# Patient Record
Sex: Male | Born: 1935 | Race: White | Hispanic: No | Marital: Married | Smoking: Never smoker
Health system: Southern US, Community
[De-identification: ages and names within clinical notes are randomized; demographics above are authoritative.]

## PROBLEM LIST (undated history)

## (undated) DIAGNOSIS — N200 Calculus of kidney: Secondary | ICD-10-CM

## (undated) DIAGNOSIS — J069 Acute upper respiratory infection, unspecified: Secondary | ICD-10-CM

## (undated) DIAGNOSIS — I493 Ventricular premature depolarization: Secondary | ICD-10-CM

## (undated) DIAGNOSIS — K219 Gastro-esophageal reflux disease without esophagitis: Secondary | ICD-10-CM

## (undated) DIAGNOSIS — I499 Cardiac arrhythmia, unspecified: Secondary | ICD-10-CM

## (undated) DIAGNOSIS — C61 Malignant neoplasm of prostate: Secondary | ICD-10-CM

## (undated) DIAGNOSIS — M1990 Unspecified osteoarthritis, unspecified site: Secondary | ICD-10-CM

## (undated) HISTORY — PX: APPENDECTOMY: SHX54

## (undated) HISTORY — PX: CHOLECYSTECTOMY: SHX55

## (undated) HISTORY — PX: BACK SURGERY: SHX140

## (undated) HISTORY — PX: SHOULDER ARTHROSCOPY: SHX128

## (undated) HISTORY — PX: KNEE ARTHROSCOPY: SUR90

## (undated) HISTORY — PX: EYE SURGERY: SHX253

## (undated) HISTORY — PX: CERVICAL SPINE SURGERY: SHX589

## (undated) HISTORY — PX: WRIST SURGERY: SHX841

## (undated) HISTORY — PX: PROSTATE BIOPSY: SHX241

## (undated) HISTORY — PX: TOTAL HIP REVISION: SHX763

## (undated) HISTORY — PX: CARDIAC CATHETERIZATION: SHX172

## (undated) HISTORY — PX: TOTAL HIP ARTHROPLASTY: SHX124

---

## 1998-06-15 ENCOUNTER — Ambulatory Visit (HOSPITAL_COMMUNITY): Admission: RE | Admit: 1998-06-15 | Discharge: 1998-06-15 | Payer: Self-pay | Admitting: Gastroenterology

## 1999-08-09 ENCOUNTER — Encounter: Payer: Self-pay | Admitting: Orthopaedic Surgery

## 1999-08-09 ENCOUNTER — Ambulatory Visit (HOSPITAL_COMMUNITY): Admission: RE | Admit: 1999-08-09 | Discharge: 1999-08-09 | Payer: Self-pay | Admitting: Orthopaedic Surgery

## 1999-10-31 ENCOUNTER — Encounter: Payer: Self-pay | Admitting: Orthopaedic Surgery

## 1999-11-03 ENCOUNTER — Inpatient Hospital Stay (HOSPITAL_COMMUNITY): Admission: RE | Admit: 1999-11-03 | Discharge: 1999-11-09 | Payer: Self-pay | Admitting: Orthopaedic Surgery

## 1999-11-03 ENCOUNTER — Encounter: Payer: Self-pay | Admitting: Orthopaedic Surgery

## 1999-11-04 ENCOUNTER — Encounter: Payer: Self-pay | Admitting: Orthopaedic Surgery

## 1999-11-07 ENCOUNTER — Encounter: Payer: Self-pay | Admitting: Orthopaedic Surgery

## 1999-11-09 ENCOUNTER — Encounter: Payer: Self-pay | Admitting: Orthopaedic Surgery

## 2001-08-14 ENCOUNTER — Encounter: Payer: Self-pay | Admitting: Orthopaedic Surgery

## 2001-08-14 ENCOUNTER — Encounter: Admission: RE | Admit: 2001-08-14 | Discharge: 2001-08-14 | Payer: Self-pay | Admitting: Orthopaedic Surgery

## 2001-08-22 ENCOUNTER — Ambulatory Visit (HOSPITAL_COMMUNITY): Admission: RE | Admit: 2001-08-22 | Discharge: 2001-08-22 | Payer: Self-pay | Admitting: Gastroenterology

## 2001-08-22 ENCOUNTER — Encounter (INDEPENDENT_AMBULATORY_CARE_PROVIDER_SITE_OTHER): Payer: Self-pay | Admitting: *Deleted

## 2001-10-01 ENCOUNTER — Encounter: Payer: Self-pay | Admitting: Family Medicine

## 2001-10-01 ENCOUNTER — Encounter: Admission: RE | Admit: 2001-10-01 | Discharge: 2001-10-01 | Payer: Self-pay | Admitting: Family Medicine

## 2003-07-26 ENCOUNTER — Encounter: Admission: RE | Admit: 2003-07-26 | Discharge: 2003-07-26 | Payer: Self-pay | Admitting: Orthopaedic Surgery

## 2003-08-20 ENCOUNTER — Ambulatory Visit (HOSPITAL_BASED_OUTPATIENT_CLINIC_OR_DEPARTMENT_OTHER): Admission: RE | Admit: 2003-08-20 | Discharge: 2003-08-20 | Payer: Self-pay | Admitting: Orthopaedic Surgery

## 2003-08-20 ENCOUNTER — Ambulatory Visit (HOSPITAL_COMMUNITY): Admission: RE | Admit: 2003-08-20 | Discharge: 2003-08-20 | Payer: Self-pay | Admitting: Orthopaedic Surgery

## 2004-11-01 ENCOUNTER — Ambulatory Visit (HOSPITAL_COMMUNITY): Admission: RE | Admit: 2004-11-01 | Discharge: 2004-11-01 | Payer: Self-pay | Admitting: Gastroenterology

## 2005-09-22 ENCOUNTER — Inpatient Hospital Stay (HOSPITAL_COMMUNITY): Admission: AD | Admit: 2005-09-22 | Discharge: 2005-09-27 | Payer: Self-pay | Admitting: Cardiology

## 2005-09-22 ENCOUNTER — Encounter: Admission: RE | Admit: 2005-09-22 | Discharge: 2005-09-22 | Payer: Self-pay | Admitting: Cardiology

## 2005-09-26 ENCOUNTER — Encounter: Payer: Self-pay | Admitting: Cardiology

## 2005-09-27 ENCOUNTER — Encounter (INDEPENDENT_AMBULATORY_CARE_PROVIDER_SITE_OTHER): Payer: Self-pay | Admitting: *Deleted

## 2005-10-16 ENCOUNTER — Encounter: Admission: RE | Admit: 2005-10-16 | Discharge: 2005-10-16 | Payer: Self-pay | Admitting: Cardiology

## 2005-10-18 ENCOUNTER — Encounter: Admission: RE | Admit: 2005-10-18 | Discharge: 2005-10-18 | Payer: Self-pay | Admitting: Orthopaedic Surgery

## 2005-11-02 ENCOUNTER — Encounter: Admission: RE | Admit: 2005-11-02 | Discharge: 2005-11-02 | Payer: Self-pay | Admitting: Orthopaedic Surgery

## 2006-05-30 ENCOUNTER — Encounter: Admission: RE | Admit: 2006-05-30 | Discharge: 2006-05-30 | Payer: Self-pay | Admitting: Family Medicine

## 2006-06-03 ENCOUNTER — Encounter: Admission: RE | Admit: 2006-06-03 | Discharge: 2006-06-03 | Payer: Self-pay | Admitting: Orthopaedic Surgery

## 2006-06-05 ENCOUNTER — Ambulatory Visit (HOSPITAL_COMMUNITY): Admission: RE | Admit: 2006-06-05 | Discharge: 2006-06-06 | Payer: Self-pay | Admitting: Neurological Surgery

## 2006-07-30 ENCOUNTER — Inpatient Hospital Stay (HOSPITAL_COMMUNITY): Admission: EM | Admit: 2006-07-30 | Discharge: 2006-07-31 | Payer: Self-pay | Admitting: *Deleted

## 2007-07-01 ENCOUNTER — Encounter: Admission: RE | Admit: 2007-07-01 | Discharge: 2007-07-01 | Payer: Self-pay | Admitting: Cardiology

## 2008-02-25 ENCOUNTER — Encounter: Admission: RE | Admit: 2008-02-25 | Discharge: 2008-02-25 | Payer: Self-pay | Admitting: Cardiology

## 2008-06-23 ENCOUNTER — Emergency Department (HOSPITAL_COMMUNITY): Admission: EM | Admit: 2008-06-23 | Discharge: 2008-06-23 | Payer: Self-pay | Admitting: Emergency Medicine

## 2008-06-23 ENCOUNTER — Ambulatory Visit (HOSPITAL_COMMUNITY): Admission: RE | Admit: 2008-06-23 | Discharge: 2008-06-23 | Payer: Self-pay | Admitting: Urology

## 2009-02-21 ENCOUNTER — Inpatient Hospital Stay (HOSPITAL_COMMUNITY): Admission: EM | Admit: 2009-02-21 | Discharge: 2009-02-25 | Payer: Self-pay | Admitting: Emergency Medicine

## 2009-02-21 ENCOUNTER — Encounter: Payer: Self-pay | Admitting: Emergency Medicine

## 2009-02-21 ENCOUNTER — Ambulatory Visit: Payer: Self-pay | Admitting: Diagnostic Radiology

## 2009-03-25 ENCOUNTER — Inpatient Hospital Stay (HOSPITAL_COMMUNITY): Admission: EM | Admit: 2009-03-25 | Discharge: 2009-04-03 | Payer: Self-pay | Admitting: Emergency Medicine

## 2009-04-30 ENCOUNTER — Ambulatory Visit (HOSPITAL_COMMUNITY): Admission: RE | Admit: 2009-04-30 | Discharge: 2009-04-30 | Payer: Self-pay | Admitting: Gastroenterology

## 2009-08-30 ENCOUNTER — Ambulatory Visit (HOSPITAL_COMMUNITY): Admission: RE | Admit: 2009-08-30 | Discharge: 2009-08-30 | Payer: Self-pay | Admitting: Gastroenterology

## 2009-09-04 HISTORY — PX: KIDNEY STONE SURGERY: SHX686

## 2009-09-20 ENCOUNTER — Encounter (INDEPENDENT_AMBULATORY_CARE_PROVIDER_SITE_OTHER): Payer: Self-pay | Admitting: General Surgery

## 2009-09-21 ENCOUNTER — Inpatient Hospital Stay (HOSPITAL_COMMUNITY): Admission: RE | Admit: 2009-09-21 | Discharge: 2009-09-23 | Payer: Self-pay | Admitting: General Surgery

## 2009-09-30 ENCOUNTER — Encounter: Admission: RE | Admit: 2009-09-30 | Discharge: 2009-09-30 | Payer: Self-pay | Admitting: General Surgery

## 2010-01-10 ENCOUNTER — Ambulatory Visit (HOSPITAL_COMMUNITY): Admission: RE | Admit: 2010-01-10 | Discharge: 2010-01-10 | Payer: Self-pay | Admitting: Orthopedic Surgery

## 2010-04-12 ENCOUNTER — Ambulatory Visit: Payer: Self-pay | Admitting: Cardiology

## 2010-04-14 ENCOUNTER — Ambulatory Visit: Payer: Self-pay | Admitting: Cardiology

## 2010-08-17 ENCOUNTER — Ambulatory Visit: Payer: Self-pay | Admitting: Cardiology

## 2010-08-22 ENCOUNTER — Ambulatory Visit: Payer: Self-pay | Admitting: Cardiology

## 2010-11-20 LAB — DIFFERENTIAL
Basophils Absolute: 0 10*3/uL (ref 0.0–0.1)
Basophils Relative: 1 % (ref 0–1)
Eosinophils Absolute: 0.3 10*3/uL (ref 0.0–0.7)
Eosinophils Relative: 5 % (ref 0–5)
Lymphs Abs: 1.4 10*3/uL (ref 0.7–4.0)
Neutrophils Relative %: 61 % (ref 43–77)

## 2010-11-20 LAB — COMPREHENSIVE METABOLIC PANEL
ALT: 21 U/L (ref 0–53)
AST: 21 U/L (ref 0–37)
CO2: 32 mEq/L (ref 19–32)
Calcium: 9.1 mg/dL (ref 8.4–10.5)
Chloride: 103 mEq/L (ref 96–112)
GFR calc Af Amer: >60 mL/min (ref >60)
GFR calc non Af Amer: >60 mL/min (ref >60)
Glucose, Bld: 108 mg/dL — ABNORMAL HIGH (ref 70–99)
Sodium: 141 mEq/L (ref 135–145)
Total Bilirubin: 1.1 mg/dL (ref 0.3–1.2)

## 2010-11-20 LAB — CBC
Hemoglobin: 16 g/dL (ref 13.0–17.0)
MCHC: 34.6 g/dL (ref 30.0–36.0)
MCV: 90.5 fL (ref 78.0–100.0)
RBC: 5.11 MIL/uL (ref 4.22–5.81)
WBC: 5.5 10*3/uL (ref 4.0–10.5)

## 2010-11-21 LAB — COMPREHENSIVE METABOLIC PANEL
ALT: 36 U/L (ref 0–53)
ALT: 42 U/L (ref 0–53)
AST: 28 U/L (ref 0–37)
AST: 29 U/L (ref 0–37)
AST: 40 U/L — ABNORMAL HIGH (ref 0–37)
Albumin: 2.9 g/dL — ABNORMAL LOW (ref 3.5–5.2)
Albumin: 2.9 g/dL — ABNORMAL LOW (ref 3.5–5.2)
Alkaline Phosphatase: 30 U/L — ABNORMAL LOW (ref 39–117)
Alkaline Phosphatase: 36 U/L — ABNORMAL LOW (ref 39–117)
CO2: 28 mEq/L (ref 19–32)
CO2: 28 mEq/L (ref 19–32)
Calcium: 8 mg/dL — ABNORMAL LOW (ref 8.4–10.5)
Chloride: 104 mEq/L (ref 96–112)
Chloride: 105 mEq/L (ref 96–112)
Creatinine, Ser: 0.9 mg/dL (ref 0.4–1.5)
Creatinine, Ser: 1.04 mg/dL (ref 0.4–1.5)
GFR calc Af Amer: >60 mL/min (ref >60)
GFR calc Af Amer: >60 mL/min (ref >60)
GFR calc Af Amer: >60 mL/min (ref >60)
GFR calc non Af Amer: >60 mL/min (ref >60)
GFR calc non Af Amer: >60 mL/min (ref >60)
Potassium: 3.5 mEq/L (ref 3.5–5.1)
Potassium: 4.1 mEq/L (ref 3.5–5.1)
Sodium: 137 mEq/L (ref 135–145)
Sodium: 138 mEq/L (ref 135–145)
Total Bilirubin: 1.5 mg/dL — ABNORMAL HIGH (ref 0.3–1.2)
Total Bilirubin: 1.7 mg/dL — ABNORMAL HIGH (ref 0.3–1.2)
Total Protein: 5.1 g/dL — ABNORMAL LOW (ref 6.0–8.3)

## 2010-11-21 LAB — TYPE AND SCREEN: ABO/RH(D): O NEG

## 2010-11-21 LAB — LIPASE, BLOOD: Lipase: 21 U/L (ref 11–59)

## 2010-11-21 LAB — AMYLASE: Amylase: 32 U/L (ref 0–105)

## 2010-11-21 LAB — ABO/RH: ABO/RH(D): O NEG

## 2010-11-21 LAB — CBC
MCHC: 34.1 g/dL (ref 30.0–36.0)
MCHC: 34.2 g/dL (ref 30.0–36.0)
MCV: 91.2 fL (ref 78.0–100.0)
Platelets: 110 10*3/uL — ABNORMAL LOW (ref 150–400)
Platelets: 99 10*3/uL — ABNORMAL LOW (ref 150–400)
RBC: 4.2 MIL/uL — ABNORMAL LOW (ref 4.22–5.81)
RBC: 4.24 MIL/uL (ref 4.22–5.81)
RBC: 4.36 MIL/uL (ref 4.22–5.81)
RDW: 13.6 % (ref 11.5–15.5)
RDW: 13.7 % (ref 11.5–15.5)
WBC: 7.4 10*3/uL (ref 4.0–10.5)
WBC: 9.7 10*3/uL (ref 4.0–10.5)

## 2010-12-08 ENCOUNTER — Other Ambulatory Visit (INDEPENDENT_AMBULATORY_CARE_PROVIDER_SITE_OTHER): Payer: Medicare Other | Admitting: *Deleted

## 2010-12-08 DIAGNOSIS — E7800 Pure hypercholesterolemia, unspecified: Secondary | ICD-10-CM

## 2010-12-08 LAB — BASIC METABOLIC PANEL
BUN: 23 mg/dL (ref 6–23)
Calcium: 9.4 mg/dL (ref 8.4–10.5)
Chloride: 105 mEq/L (ref 96–112)
Creatinine, Ser: 0.9 mg/dL (ref 0.4–1.5)
GFR: 86.67 mL/min (ref >60.00)

## 2010-12-08 LAB — HEPATIC FUNCTION PANEL
ALT: 27 U/L (ref 0–53)
Total Bilirubin: 1.6 mg/dL — ABNORMAL HIGH (ref 0.3–1.2)

## 2010-12-08 LAB — LIPID PANEL
Cholesterol: 191 mg/dL (ref 0–200)
LDL Cholesterol: 117 mg/dL — ABNORMAL HIGH (ref 0–99)
Triglycerides: 139 mg/dL (ref 0.0–149.0)

## 2010-12-09 ENCOUNTER — Other Ambulatory Visit: Payer: Self-pay | Admitting: *Deleted

## 2010-12-11 LAB — DIFFERENTIAL
Eosinophils Relative: 1 % (ref 0–5)
Lymphocytes Relative: 12 % (ref 12–46)
Lymphs Abs: 1.2 10*3/uL (ref 0.7–4.0)
Monocytes Absolute: 0.6 10*3/uL (ref 0.1–1.0)
Monocytes Relative: 6 % (ref 3–12)
Neutro Abs: 7.8 10*3/uL — ABNORMAL HIGH (ref 1.7–7.7)

## 2010-12-11 LAB — BASIC METABOLIC PANEL
BUN: 10 mg/dL (ref 6–23)
CO2: 32 mEq/L (ref 19–32)
GFR calc Af Amer: >60 mL/min (ref >60)
GFR calc non Af Amer: >60 mL/min (ref >60)
Glucose, Bld: 120 mg/dL — ABNORMAL HIGH (ref 70–99)
Glucose, Bld: 143 mg/dL — ABNORMAL HIGH (ref 70–99)
Potassium: 3.5 mEq/L (ref 3.5–5.1)
Potassium: 3.6 mEq/L (ref 3.5–5.1)
Sodium: 142 mEq/L (ref 135–145)
Sodium: 142 mEq/L (ref 135–145)

## 2010-12-11 LAB — CBC
HCT: 39.6 % (ref 39.0–52.0)
HCT: 46.7 % (ref 39.0–52.0)
Hemoglobin: 14.1 g/dL (ref 13.0–17.0)
Hemoglobin: 16.2 g/dL (ref 13.0–17.0)
Hemoglobin: 16.2 g/dL (ref 13.0–17.0)
MCHC: 35.5 g/dL (ref 30.0–36.0)
MCHC: 35.5 g/dL (ref 30.0–36.0)
MCV: 92.1 fL (ref 78.0–100.0)
Platelets: 143 10*3/uL — ABNORMAL LOW (ref 150–400)
Platelets: 178 10*3/uL (ref 150–400)
RBC: 4.2 MIL/uL — ABNORMAL LOW (ref 4.22–5.81)
RBC: 4.28 MIL/uL (ref 4.22–5.81)
RBC: 5.09 MIL/uL (ref 4.22–5.81)
RDW: 13.1 % (ref 11.5–15.5)
RDW: 13.4 % (ref 11.5–15.5)
RDW: 13.6 % (ref 11.5–15.5)
RDW: 13.8 % (ref 11.5–15.5)
WBC: 7.2 10*3/uL (ref 4.0–10.5)
WBC: 9.8 10*3/uL (ref 4.0–10.5)

## 2010-12-11 LAB — CARDIAC PANEL(CRET KIN+CKTOT+MB+TROPI)
CK, MB: 0.8 ng/mL (ref 0.3–4.0)
CK, MB: 1.3 ng/mL (ref 0.3–4.0)
CK, MB: 1.3 ng/mL (ref 0.3–4.0)
Relative Index: INVALID (ref 0.0–2.5)
Total CK: 39 U/L (ref 7–232)
Total CK: 42 U/L (ref 7–232)
Troponin I: 0.04 ng/mL (ref 0.00–0.06)

## 2010-12-11 LAB — COMPREHENSIVE METABOLIC PANEL
ALT: 25 U/L (ref 0–53)
AST: 23 U/L (ref 0–37)
Albumin: 3.1 g/dL — ABNORMAL LOW (ref 3.5–5.2)
Alkaline Phosphatase: 54 U/L (ref 39–117)
CO2: 34 mEq/L — ABNORMAL HIGH (ref 19–32)
Calcium: 8.4 mg/dL (ref 8.4–10.5)
Chloride: 107 mEq/L (ref 96–112)
Creatinine, Ser: 0.96 mg/dL (ref 0.4–1.5)
GFR calc Af Amer: >60 mL/min (ref >60)
GFR calc non Af Amer: >60 mL/min (ref >60)
Glucose, Bld: 88 mg/dL (ref 70–99)
Potassium: 4.1 mEq/L (ref 3.5–5.1)
Sodium: 141 mEq/L (ref 135–145)
Sodium: 142 mEq/L (ref 135–145)
Total Protein: 5.4 g/dL — ABNORMAL LOW (ref 6.0–8.3)
Total Protein: 6.2 g/dL (ref 6.0–8.3)

## 2010-12-11 LAB — LIPID PANEL
Cholesterol: 155 mg/dL (ref 0–200)
HDL: 46 mg/dL (ref >39)
LDL Cholesterol: 117 mg/dL — ABNORMAL HIGH (ref 0–99)
LDL Cholesterol: 92 mg/dL (ref 0–99)
Total CHOL/HDL Ratio: 3.1 RATIO
Triglycerides: 89 mg/dL (ref <150)
VLDL: 18 mg/dL (ref 0–40)

## 2010-12-11 LAB — CK TOTAL AND CKMB (NOT AT ARMC)
CK, MB: 1.2 ng/mL (ref 0.3–4.0)
Relative Index: INVALID (ref 0.0–2.5)
Relative Index: INVALID (ref 0.0–2.5)
Total CK: 40 U/L (ref 7–232)
Total CK: 59 U/L (ref 7–232)

## 2010-12-11 LAB — HEPARIN LEVEL (UNFRACTIONATED): Heparin Unfractionated: 0.66 IU/mL (ref 0.30–0.70)

## 2010-12-11 LAB — POCT CARDIAC MARKERS
CKMB, poc: <1 ng/mL — ABNORMAL LOW (ref 1.0–8.0)
Myoglobin, poc: 77.8 ng/mL (ref 12–200)
Troponin i, poc: <0.05 ng/mL (ref 0.00–0.09)

## 2010-12-11 LAB — C-REACTIVE PROTEIN: CRP: 0.8 mg/dL — ABNORMAL HIGH (ref <0.6)

## 2010-12-11 LAB — TROPONIN I: Troponin I: 0.03 ng/mL (ref 0.00–0.06)

## 2010-12-11 LAB — SEDIMENTATION RATE: Sed Rate: 21 mm/hr — ABNORMAL HIGH (ref 0–16)

## 2010-12-11 LAB — LIPASE, BLOOD: Lipase: 34 U/L (ref 11–59)

## 2010-12-11 LAB — HEPATIC FUNCTION PANEL
AST: 26 U/L (ref 0–37)
Albumin: 3.2 g/dL — ABNORMAL LOW (ref 3.5–5.2)
Total Protein: 6 g/dL (ref 6.0–8.3)

## 2010-12-11 LAB — PROTIME-INR
INR: 1 (ref 0.00–1.49)
Prothrombin Time: 13.9 seconds (ref 11.6–15.2)

## 2010-12-12 ENCOUNTER — Encounter: Payer: Self-pay | Admitting: *Deleted

## 2010-12-12 LAB — DIFFERENTIAL
Basophils Absolute: 0 10*3/uL (ref 0.0–0.1)
Basophils Relative: 0 % (ref 0–1)
Eosinophils Absolute: 0 10*3/uL (ref 0.0–0.7)
Eosinophils Relative: 0 % (ref 0–5)
Lymphocytes Relative: 7 % — ABNORMAL LOW (ref 12–46)
Monocytes Absolute: 0.5 10*3/uL (ref 0.1–1.0)
Monocytes Relative: 3 % (ref 3–12)
Monocytes Relative: 5 % (ref 3–12)
Neutro Abs: 8 10*3/uL — ABNORMAL HIGH (ref 1.7–7.7)
Neutrophils Relative %: 84 % — ABNORMAL HIGH (ref 43–77)

## 2010-12-12 LAB — CSF CELL COUNT WITH DIFFERENTIAL
RBC Count, CSF: 363 /mm3 — ABNORMAL HIGH
RBC Count, CSF: 5 /mm3 — ABNORMAL HIGH
Tube #: 1
Tube #: 4
WBC, CSF: 0 /mm3 (ref 0–5)
WBC, CSF: 3 /mm3 (ref 0–5)

## 2010-12-12 LAB — CBC
HCT: 41.1 % (ref 39.0–52.0)
HCT: 44.8 % (ref 39.0–52.0)
HCT: 45.3 % (ref 39.0–52.0)
HCT: 47.3 % (ref 39.0–52.0)
Hemoglobin: 14.4 g/dL (ref 13.0–17.0)
Hemoglobin: 16 g/dL (ref 13.0–17.0)
MCHC: 35.4 g/dL (ref 30.0–36.0)
MCV: 91 fL (ref 78.0–100.0)
MCV: 91.1 fL (ref 78.0–100.0)
MCV: 92.3 fL (ref 78.0–100.0)
Platelets: 106 10*3/uL — ABNORMAL LOW (ref 150–400)
Platelets: 86 10*3/uL — ABNORMAL LOW (ref 150–400)
RBC: 4.92 MIL/uL (ref 4.22–5.81)
RDW: 12.4 % (ref 11.5–15.5)
RDW: 13.3 % (ref 11.5–15.5)
WBC: 9.4 10*3/uL (ref 4.0–10.5)

## 2010-12-12 LAB — URINALYSIS, ROUTINE W REFLEX MICROSCOPIC
Ketones, ur: NEGATIVE mg/dL
Leukocytes, UA: NEGATIVE
Nitrite: NEGATIVE
pH: 6 (ref 5.0–8.0)

## 2010-12-12 LAB — CSF CULTURE W GRAM STAIN
Culture: NO GROWTH
Gram Stain: NONE SEEN

## 2010-12-12 LAB — COMPREHENSIVE METABOLIC PANEL
Albumin: 3.2 g/dL — ABNORMAL LOW (ref 3.5–5.2)
BUN: 16 mg/dL (ref 6–23)
Calcium: 9 mg/dL (ref 8.4–10.5)
Creatinine, Ser: 0.9 mg/dL (ref 0.4–1.5)
Total Protein: 6.2 g/dL (ref 6.0–8.3)

## 2010-12-12 LAB — GRAM STAIN: Gram Stain: NONE SEEN

## 2010-12-12 LAB — URINE MICROSCOPIC-ADD ON

## 2010-12-12 LAB — BASIC METABOLIC PANEL
BUN: 13 mg/dL (ref 6–23)
Chloride: 103 mEq/L (ref 96–112)
GFR calc Af Amer: >60 mL/min (ref >60)
GFR calc non Af Amer: >60 mL/min (ref >60)
Glucose, Bld: 123 mg/dL — ABNORMAL HIGH (ref 70–99)
Potassium: 4 mEq/L (ref 3.5–5.1)

## 2010-12-12 LAB — URINE CULTURE: Colony Count: 3000

## 2010-12-12 LAB — PROTEIN AND GLUCOSE, CSF
Glucose, CSF: 73 mg/dL (ref 43–76)
Total  Protein, CSF: 35 mg/dL (ref 15–45)

## 2010-12-12 LAB — APTT: aPTT: 27 seconds (ref 24–37)

## 2010-12-13 ENCOUNTER — Encounter: Payer: Self-pay | Admitting: Cardiology

## 2010-12-13 ENCOUNTER — Ambulatory Visit (INDEPENDENT_AMBULATORY_CARE_PROVIDER_SITE_OTHER): Payer: Medicare Other | Admitting: Cardiology

## 2010-12-13 DIAGNOSIS — M1712 Unilateral primary osteoarthritis, left knee: Secondary | ICD-10-CM | POA: Insufficient documentation

## 2010-12-13 DIAGNOSIS — M1990 Unspecified osteoarthritis, unspecified site: Secondary | ICD-10-CM

## 2010-12-13 DIAGNOSIS — I119 Hypertensive heart disease without heart failure: Secondary | ICD-10-CM | POA: Insufficient documentation

## 2010-12-13 DIAGNOSIS — E785 Hyperlipidemia, unspecified: Secondary | ICD-10-CM | POA: Insufficient documentation

## 2010-12-13 DIAGNOSIS — N400 Enlarged prostate without lower urinary tract symptoms: Secondary | ICD-10-CM | POA: Insufficient documentation

## 2010-12-13 DIAGNOSIS — Z9049 Acquired absence of other specified parts of digestive tract: Secondary | ICD-10-CM | POA: Insufficient documentation

## 2010-12-13 NOTE — Assessment & Plan Note (Signed)
The patient's weight is up 1 pound and his lipids are not as good this time.  We reviewed those with him.  He will try harder with diet.  He is not presently on any statin medication but We will consider this for the future if lipids remain high.  He has not been as physically active because of problems with his arthritis.

## 2010-12-13 NOTE — Progress Notes (Signed)
History of Present Illness: This pleasant 76 year old gentleman is seen for a scheduled followup visit.  He has a past history of atypical chest pain and he has had several cardiac catheterizations which have not shown any significant obstructive coronary disease.  His most recent catheter was in July 2010.  He has a past history of gastrointestinal problems.  Previously he has had gastritis and duodenitis and a diaphragmatic hiatal hernia.  He was found to have chronic cholecystitis and underwent laparoscopic cholecystectomy by Dr. Zachery Dakins on 09/20/09.  Postoperatively he required ERCP by Dr. Ewing Schlein for removal of some common duct stones.  He is not having any subsequent problem from the gallbladder area.  Current Outpatient Prescriptions  Medication Sig Dispense Refill  . aspirin 325 MG tablet Take 325 mg by mouth daily.        Marland Kitchen ibuprofen (ADVIL,MOTRIN) 200 MG tablet Take 200 mg by mouth every 6 (six) hours as needed.        . metoprolol tartrate (LOPRESSOR) 25 MG tablet Take 25 mg by mouth daily.        . nitroGLYCERIN (NITROSTAT) 0.4 MG SL tablet Place 0.4 mg under the tongue every 5 (five) minutes as needed.        . potassium chloride SA (K-DUR,KLOR-CON) 20 MEQ tablet Take 20 mEq by mouth daily.       . Ascorbic Acid (VITAMIN C) 500 MG tablet Take 1 tablet (500 mg total) by mouth daily.  30 tablet      Allergies  Allergen Reactions  . Sulfa Antibiotics   . Tetracyclines & Related     Patient Active Problem List  Diagnoses  . Benign hypertensive heart disease without heart failure  . BPH (benign prostatic hyperplasia)  . Osteoarthritis  . Dyslipidemia  . Status post cholecystectomy    History  Smoking status  . Never Smoker   Smokeless tobacco  . Not on file    History  Alcohol Use No    Family History  Problem Relation Age of Onset  . Heart disease Mother   . Heart attack Mother     Review of Systems: Constitutional: no fever chills diaphoresis or fatigue or  change in weight.  Head and neck: no hearing loss, no epistaxis, no photophobia or visual disturbance. Respiratory: No cough, shortness of breath or wheezing. Cardiovascular: No chest pain peripheral edema, palpitations. Gastrointestinal: No abdominal distention, no abdominal pain, no change in bowel habits hematochezia or melena. Genitourinary: No dysuria, no frequency, no urgency, no nocturia. 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 Neurological: No dizziness, no headaches, no numbness, no seizures, no syncope, no weakness, no tremors. Hematologic: No lymphadenopathy, no easy bruising. Psychiatric: No confusion, no hallucinations, no sleep disturbance.    Physical Exam: Filed Vitals:   12/13/10 1348  BP: 138/80  Pulse: 66  Weight 234.  The general appearance reveals a well-developed tall elderly gentleman in no distress.Pupils equal and reactive.   Extraocular Movements are full.  There is no scleral icterus.  The mouth and pharynx are normal.  The neck is supple.  The carotids reveal no bruits.  The jugular venous pressure is normal.  The thyroid is not enlarged.  There is no lymphadenopathy.The chest is clear to percussion and auscultation. There are no rales or rhonchi. Expansion of the chest is symmetrical.The precordium is quiet.  The first heart sound is normal.  The second heart sound is physiologically split.  There is no murmur gallop rub or click.  There is no abnormal lift or heave.The abdomen is soft and nontender. Bowel sounds are normal. The liver and spleen are not enlarged. There Are no abdominal masses. There are no bruits.The pedal pulses are good.  There is no phlebitis or edema.  There is no cyanosis or clubbing.Strength is normal and symmetrical in all extremities.  There is no lateralizing weakness.  There are no sensory deficits.   Assessment / Plan: Continue same  medication.  Work harder on careful low-cholesterol low carbohydrate diet.  Lose weight.  Recheck in 3 months.

## 2010-12-13 NOTE — Assessment & Plan Note (Signed)
The patient has been doing well in terms of his blood pressure.  His not having any dizziness or syncope.  He denies headaches.  He denies any exertional chest pain or increased shortness of breath

## 2010-12-13 NOTE — Assessment & Plan Note (Signed)
The patient has been told by his orthopedic surgeon Dr. Cleophas Dunker that he will probably need to have his right artificial hip reoperated upon.  Apparently the plastic insert and the present prosthesis has worn out and accounts for the patient's pain.  The patient is no longer able to play golf because of his hip pain.

## 2011-01-17 NOTE — Op Note (Signed)
[name], [name]              ACCOUNT NO.:  [id number]   MEDICAL RECORD NO.:  [id number]          PATIENT TYPE:  INP   LOCATION:  2928                         FACILITY:  [facility]   PHYSICIAN:  Fayrene Fearing L. Malon Kindle., M.D.DATE OF BIRTH:  September 25, 1934   DATE OF PROCEDURE:  03/29/2009  DATE OF DISCHARGE:                               OPERATIVE REPORT   SURGEON:  Fayrene Fearing L. Randa Evens, MD   PROCEDURE:  Esophagogastroduodenoscopy.   MEDICATIONS:  1. Cetacaine spray.  2. Fentanyl 25 mcg.  3. Versed 2.5 mg IV.   INDICATION:  Chest pain with extensive workup including ultrasound to  the abdomen, CT angio of the chest, EKG, and cardiac catheterization  failing to reveal cause of the pain.  This is done to look for an upper  GI source.  The patient has had previous endoscopies showing gastritis,  has some mild heartburn symptoms for which he takes omeprazole as an  outpatient.   DESCRIPTION OF PROCEDURE:  Procedure explained to the patient and  consent obtained.  In left lateral decubitus position, the Pentax upper  endoscope was inserted into the esophagus with agglutination advanced  into the stomach.  The pylorus was identified and passed to duodenum  including the bulb and second portion was completely normal.  There was  some very mild streaky gastritis in the antrum of a minimal nature.  No  ulcerations.  Fundus and cardia seen well on the retroflexed view and  appeared to be normal.  There was a 2- to 3-cm hiatal hernia, patent GE  junction.  The distal esophagus was free of ulceration or inflammation.  There were no gross lesions throughout the esophagus upon withdrawal of  the scope.  Scope was withdrawn, and the patient tolerated the procedure  well.  There were no immediate complications.   ASSESSMENT:  1. Chest pain with no clear gastrointestinal cause on this upper      endoscopy.  2. Mild gastritis of a minimal nature, possibly due to aspirin      therapy.   PLAN:  We will  continue him on his current medications and diet and  recommend b.i.d. proton pump inhibitor.  We will try to schedule  manometry as an outpatient.           ______________________________  Llana Aliment. Malon Kindle., M.D.     Waldron Session  D:  03/29/2009  T:  03/29/2009  Job:  [id number]   cc:   Cassell Clement, M.D.

## 2011-01-17 NOTE — Consult Note (Signed)
NAMESHED, NIXON NO.:  [id number]   MEDICAL RECORD NO.:  [id number]          PATIENT TYPE:  INP   LOCATION:  2009                         FACILITY:  [facility]   PHYSICIAN:  Cherylynn Ridges, M.D.    DATE OF BIRTH:  Jul 15, 1935   DATE OF CONSULTATION:  03/31/2009  DATE OF DISCHARGE:                                 CONSULTATION   REFERRING PHYSICIAN:  Cassell Clement, MD   Dear Dr. Randa Evens thank you very much for asking me to see [name] a  very pleasant 76 year old gentleman who has had multiple episodes of  significant substernal parasternal discomfort and pain at times  radiating up to his right shoulder, other times up into his neck and  also mostly on his left side.  He is admitted on March 25, 2009, here for  cardiac workup, which has been negative for any significant cardiac  disease.   A lot of his pain is postprandial where he would have eaten and have  significant discomfort in his chest, mid chest, and his left parasternal  area.  He will have nausea, no vomiting.  He has had no fevers or  chills, but once in a while, he was down at [facility] playing golf  and he almost had to call 911.  Further workup here demonstrates that he  had ultrasound, which did not show thickening of his gallbladder wall.  No ductal dilatation, questionable sludge, but no stones.  A HIDA scan  was done, which showed visualization and he was administered morphine,  but ejection fraction was not performed since he had gotten the  morphine.  I felt as well as the demonstration of gallbladder filling  after morphine was consistent with acute cholecystitis, however,  consistent with cholecystitis with a chronic or acute heart burn, this  does not correlate with this is down.   He is currently afebrile.  His other vital signs are stable.  He is not  jaundiced.  Does not appear to be in any acute distress.  He is sitting  up, eating normally, still having some left parasternal  discomfort.  On  abdominal exam while sitting with the liver and gallbladder falling  down, he had absolutely no abdominal tenderness and thus the pain.  He  has normoactive bowel sounds.  He has no rebound or guarding.   IMPRESSION:  I have looked at his liver function tests, which are all  normal.  His amylase and lipase are normal.  My impression is that the  patient although has an abnormal HIDA scan, which may demonstrate some  chronic cholecystitis does not have acute cholecystitis and no evidence  to correlate with his current clinical examination.  Most of his  discomfort and pain is on the left parasternal area, also on the right  side in the chest area, none in the abdominal area.  It would be an  unusual presentation for biliary colic and or acute cholecystitis.  Based on these findings, I personally would not recommend a laparoscopic  cholecystectomy; however, I will run this information by my partners to  see  if they would consider lap cole in this patient.  The other concern  mildly is that he is on aspirin and if we are to perform the surgery, he  will need to be out of the aspirin for at least 3-5 days prior to  surgical intervention.      Cherylynn Ridges, M.D.  Electronically Signed     JOW/MEDQ  D:  03/31/2009  T:  03/31/2009  Job:  [id number]

## 2011-01-17 NOTE — Consult Note (Signed)
NAME:  [name], MEHRING              ACCOUNT NO.:  [id number]   MEDICAL RECORD NO.:  [id number]          PATIENT TYPE:  INP   LOCATION:  5011                         FACILITY:  [facility]   PHYSICIAN:  Deanna Artis. Hickling, M.D.DATE OF BIRTH:  September 24, 1934   DATE OF CONSULTATION:  02/22/2009  DATE OF DISCHARGE:                                 CONSULTATION   CHIEF COMPLAINT:  Headache.   HISTORY OF PRESENT CONDITION:  A 76 year old gentleman who has been  healthy other than an old history of migraines, cervical spondylosis  with a couple of operations, and diastolic cardiac dysfunction who had  sudden onset of severe headache and neck pain that began over 24 hours  ago.  The patient was at a party when he had sudden onset of neck pain  which was sharp and shooting.  It moved upwards into the back of his  head.  The patient has had persistent head and neck pain since that time  which is steady like a band around his head associated with nausea, but  no vomiting.  The patient's maximum temperature was 100.6.  He has also  had some chills.  There has been no sensory to light sensory to sound,  rash, diarrhea or cough.  He was bitten by a bug yesterday on his  forearm, and it raised a welt, I do not believe there was a connection  between those, he has picked no ticks off him.  He has traveled [place] only.  He does get outdoors a lot but is unaware of any tick  bites.   He has been refractory to treatment with IV morphine.  He had a CT scan  of the brain, CT scan of cervical spine, and lumbar puncture yesterday.  CT scan of the brain was normal.  CT scan of cervical spine showed  cervical spondylosis that was mild without subluxation, herniated disk  or loss of disk space.  CSF showed 0 white blood cells, few red blood  cells, glucose 73, and protein 35.  Gram-stain negative.  His other  laboratory showed normal comprehensive metabolic panel, normal CBC,  platelet count was 106,000,  dropped to 86,000.  He does have elevated  liver functions, AST 154 and ALT 198.   PAST MEDICAL HISTORY:  1. Fatty liver.  2. Asthma.  3. Osteoarthritis.   PAST SURGICAL HISTORY:  Status post hemi hip arthroplasty, left shoulder  surgery, EGD for gastritis in 2007, colonic polypectomy, T1  radiculopathy with radiculoplasty in 2007, and ureteral stone in 2003.   FAMILY HISTORY:  Noncontributory.   MEDICATIONS AT HOME:  Aspirin, Toprol, Flomax, and calcium chloride.   CURRENT MEDICATIONS:  1. Zovirax 800 mg every 8 hours.  2. Rocephin 2 g every 24 hours.  3. Morphine and Zofran as needed.   He has allergies to SULFA, ERYTHROMYCIN, and TETRACYCLINE.   REVIEW OF SYSTEMS:  Negative except as noted above.  He has never been  in pain like this.   PHYSICAL EXAMINATION:  VITAL SIGNS:  Today temperature 98.3, blood  pressure 116/69, resting pulse 58, respirations 17, and oxygen  saturation 93% on room air.  HEAD, EYES, EARS, NOSE, AND THROAT:  No signs of infection.  NECK:  Supple.  Full range of motion.  No cranial or cervical bruits.  LUNGS:  Clear to auscultation.  HEART:  No murmurs.  Pulses normal.  ABDOMEN:  Soft and nontender.  Bowel sounds normal.  EXTREMITIES:  Well formed without edema, cyanosis, alterations in tone  or tight heel cords.  SKIN:  No lesions.  VASCULAR:  Tone normal.  NEUROLOGIC:  Mental status; awake, alert,  attentive and appropriate.  No dysphasia, dyspraxia. Names objects.  Follows commands.  Conveys  thoughts and feelings. Cranial nerves; round and reactive pupils.  Visual fields are full to double simultaneous stimuli.  Extraocular  movements full and conjugate.  Symmetric facial strength and sensation.  Air conduction greater than bone conduction bilaterally.  Motor  examination; normal strength, tone, and mass.  Good fine motor  movements.  No pronator drift.  Sensation intact or cold vibration and  stereognosis.  Cerebellar examination; good  finger-to-nose and rapid eye  movements.  Gait was not tested.  Deep tendon reflexes were symmetric  and absent.  The patient had bilateral flexor plantar responses.   IMPRESSION:  Severe headache and neck pain of unknown etiology. 784.0   The headache is very different from his migraines.  Nonetheless, the  sudden onset of symptoms, subarachnoid hemorrhage and infections have  ruled out.  Venous sinus thrombosis is a possibility but highly  unlikely.  Migraines is also a possibility even though this is atypical  for him.  The patient has low-grade fever without any other signs or  symptoms of infection.   PLAN:  IV Depacon 250 q.6 h.  If that fails, we will perform a limited  MRI scan T2 FLAIR fusion and an MRV.  I appreciate the opportunity to  participate in his care.      Deanna Artis. Sharene Skeans, M.D.  Electronically Signed     [facility]/MEDQ  D:  02/22/2009  T:  02/23/2009  Job:  [id number]   cc:   [facility]

## 2011-01-17 NOTE — Op Note (Signed)
NAMEKEYVIN, RISON              ACCOUNT NO.:  [id number]   MEDICAL RECORD NO.:  [id number]          PATIENT TYPE:  AMB   LOCATION:  DAY                          FACILITY:  [facility]   PHYSICIAN:  Valetta Fuller, M.D.  DATE OF BIRTH:  [birth year]   DATE OF PROCEDURE:  06/23/2008  DATE OF DISCHARGE:                               OPERATIVE REPORT   PREOPERATIVE DIAGNOSIS:  Left distal ureteral calculus.   POSTOPERATIVE DIAGNOSIS:  Left distal ureteral calculus.   PROCEDURE PERFORMED:  Cystoscopy, rigid ureteroscopy, Holmium laser  lithotripsy, basketing of fragments and left double-J stent placement 24  cm x 6 [place].   SURGEON:  Valetta Fuller, M.D.   ANESTHESIA:  General.   INDICATIONS:  Mr. Coonradt is a 76 year old male.  He has had a number of  urologic issues including some bladder neck obstruction.  In the past he  has been known to have several small stones in the lower pole of his  left kidney.  He presented to the emergency room earlier today with  severe sudden onset of left abdominal pain.  CT was performed which  showed a 5 mm stone.  It was unclear to the radiologist whether this was  in the bladder or the intramural ureter.  I reviewed his scans and given  his ongoing pain it certainly appeared that the stone was probably in  the intramural ureter right at the ureterovesical junction.  The patient  was unable to be controlled with regard to his discomfort and pain.  He  was in the ER for several hours and was not able to be made comfortable.  For that reason we asked that the patient be transferred to [facility]  for consideration of definitive intervention.  We talked about the pros  and cons of that with the patient and full informed consent was  obtained.  He appeared to understand the potential complications of  ureteroscopy.   TECHNIQUE AND FINDINGS:  The patient was brought to the operating room  where he had successful induction of general anesthesia.  He  was placed  in the mid lithotomy position and prepped and draped in the usual  manner.  The Foley catheter that had been inserted in the ER due to some  initial poor urinary output was removed.  The patient was prepped and  draped in the usual manner.  Cystoscopy revealed moderate trilobar  hyperplasia with a fairly high-riding and prominent median bar and small  middle lobe of his prostate.  Inspection of his bladder revealed a stone  starting to crown somewhat at the left ureterovesical junction.  Given  the visualization of the stone within the intramural ureter we did not  feel retrograde pyelogram was necessary.  A guidewire was placed beyond  the stone to the left renal pelvis and the cystoscope was then removed.   Rigid ureteroscopy was then performed.  An approximately 5-6 mm stone  was encountered in the intramural ureter.  There was a fair amount of  mucosal edema.  The Holmium laser lithotriptor was utilized to fracture  the stone into approximately  8-10 pieces.  The largest 3-4 pieces were  basket extracted and placed in the bladder.  Because of the substantial  inner mural edema we felt double-J stent placement was indicated for 5-7  days.  Once the guidewire was confirmed to be in good position a 6  French 24 cm stent was placed over the guidewire.  Good position was  confirmed visually as well as fluoroscopic guidance.  Small pieces of  stone were taken and will be sent for analysis.  Lidocaine jelly was  instilled.  The patient appeared to tolerate the procedure and there  were no obvious complications.      Valetta Fuller, M.D.  Electronically Signed     DSG/MEDQ  D:  06/23/2008  T:  06/24/2008  Job:  [id number]

## 2011-01-17 NOTE — H&P (Signed)
[name], [name]              ACCOUNT NO.:  [id number]   MEDICAL RECORD NO.:  [id number]          PATIENT TYPE:  INP   LOCATION:  2928                         FACILITY:  [facility]   PHYSICIAN:  Cassell Clement, M.D. DATE OF BIRTH:  14-Jan-1935   DATE OF ADMISSION:  03/25/2009  DATE OF DISCHARGE:                              HISTORY & PHYSICAL   CHIEF COMPLAINT:  Chest pain.   HISTORY:  This is a 76 year old gentleman who is admitted from [facility]  Emergency Room with severe chest pain.  The pain began at about  midnight.  It began to the right of the sternum and then spread across  the sternum.  It awoke him from sleep.  He has described as a sharp,  squeezing tightness, associated with shortness of breath.  There was no  radiation down the left arm.  There was no nausea or vomiting or  diaphoresis.  The patient has tried drinking soda and taking antacids,  none of which has helped.  He came to the emergency room at 5:45 in the  morning.  He was given a trial of nitroglycerin with no improvement and  subsequent shots of morphine and then Dilaudid with only slight  improvement.  In the emergency room, his electrocardiogram was nonacute,  and his initial cardiac enzymes were normal.  He had a CT angiogram of  the chest, which showed heavy coronary artery calcification, but no  evidence for pulmonary emboli or thoracic aortic dissection or focal  aneurysm, and there was no mediastinal or hilar adenopathy, and no  pulmonary nodules.  The lungs were clear.  His D-dimer was slightly  elevated.  Of note is the fact that the patient has a past history of  recent evaluation by Neurology for headache, fever, nausea, and  vomiting, and had a MRI of the chest on February 24, 2009, which was normal.   FAMILY HISTORY:  The patient's father died of suicide.  There is no  history of premature coronary artery disease.   SOCIAL HISTORY:  He is retired from the police force.  He enjoys playing  golf.  He  does not use any alcohol or tobacco.   PAST SURGICAL HISTORY:  Multiple orthopedic procedures including 3 or 4  back operations as well as surgery on his knees, wrists, and shoulder.   ALLERGIES:  He is allergic to SULFA and MYCINS.  Also, ARICEPT results  in cramps and insomnia and nightmares.   REVIEW OF SYSTEMS:  No recent change in GI symptoms.  He does have a  past history of gastric reflux.  He underwent esophagogastroduodenoscopy  with biopsy on September 30, 2005, by Dr. Ewing Schlein, which showed moderate  antritis, gastritis, but otherwise normal study that was during a  previous admission in January 2007 for severe chest pain.  During that  admission, the patient underwent cardiac catheterization on September 25, 2005, by Dr. Elease Hashimoto, which showed minimal coronary artery  irregularities, but no obstructive disease and showed left ventricular  systolic function in the lower limits of normal.  The patient denies  cough or sputum production or hemoptysis.  He has had no recent chills  or fever.  All other symptoms negative in detail.   PHYSICAL EXAMINATION:  VITAL SIGNS:  His blood pressure is 166/77, pulse  of 60 and regular, respirations are normal, and he is afebrile.  GENERAL APPEARANCE:  A well-developed large gentleman, in no acute  distress, other than for complaining of ongoing chest pain.  SKIN:  Warm and dry.  There is no diaphoresis.  No skin rash.  HEAD AND NECK:  Pupils are equal and reactive.  Sclerae are clear.  Extraocular movements are full.  Mouth and pharynx are normal.  Jugular  venous pressure normal.  Thyroid not enlarged.  There is no  lymphadenopathy.  Carotids reveal no bruits.  CHEST:  Clear to percussion and auscultation.  The chest wall reveals no  point tenderness to palpation.  HEART:  Quiet precordium without murmur, gallop, rub, or click.  There  is no abnormal lift or heave, second sound is physiologically split.  ABDOMEN:  Soft and nontender.  The liver  and spleen are not enlarged.  There is no abdominal tenderness or mass.  The femoral pulses and pedal  pulses are normal.  There is no phlebitis or edema.  There is no  lymphadenopathy.  NEUROLOGIC:  Physiologic.   IMPRESSION:  1. Severe chest pain, requiring IV morphine and IV Dilaudid, and not      responding to IV nitroglycerin.  The etiology of the pain is not      clear.  At this point, we do not have any objective evidence of      coronary ischemia or myocardial infarction.  2. Labile hypertension.  3. Past history of gastritis.  4. History of osteoarthritis.  5. History of mild-to-moderate left ventricular hypertrophy with      diastolic dysfunction by echocardiogram in 2007.  6. Past history of diffuse fatty infiltration of liver.  7. Past history of hyperglycemia.   DISPOSITION:  The patient is being admitted from the Coronary Care Unit  to the Coronary Step-Down Unit.  IV nitroglycerin has been started in  the emergency room.  So far, no significant relief of pain.  We will  treat the pain with IV Dilaudid, which seemed to work better for him  than the IV morphine.  We will get serial cardiac enzymes.  We will  continue him on nitrates, aspirin, and beta-blocker.  We will check  lipids.  We will get serial cardiac enzymes and EKGs.  Anticipate  cardiac catheterization tomorrow morning by Dr. Elease Hashimoto to further  evaluate his chest pain.   All other review of systems negative in detail.           ______________________________  Cassell Clement, M.D.     TB/MEDQ  D:  03/25/2009  T:  03/26/2009  Job:  [id number]   cc:   Vesta Mixer, M.D.

## 2011-01-17 NOTE — Cardiovascular Report (Signed)
NAMESILVIO, SAUSEDO NO.:  [id number]   MEDICAL RECORD NO.:  [id number]           PATIENT TYPE:   LOCATION:                                 FACILITY:   PHYSICIAN:  Vesta Mixer, M.D. DATE OF BIRTH:  1934/10/28   DATE OF PROCEDURE:  03/26/2009  DATE OF DISCHARGE:                            CARDIAC CATHETERIZATION   [name] is a 76 year old gentleman with a history of chest pains.  He had an unremarkable cath several years ago.   The patient presented to the ER with chest pain yesterday.  He had a CT  angiogram for evaluation of pulmonary embolus.  It was negative for  pulmonary embolus, but he was found to have coronary calcifications.  We  have scheduled for heart catheterization for further evaluation.   The procedure was left heart catheterization with coronary angiography.  The right femoral artery was easily cannulated using the modified  Seldinger technique.   HEMODYNAMICS:  LV pressure is 109/70 with an aortic pressure of 105/56.   ANGIOGRAPHY:  1. Left main:  The left main has mild-to-moderate amount of      calcification.  There are minor luminal irregularities.  2. The left anterior descending artery is mildly-to-moderately      calcified.  There are minor luminal irregularities throughout the      LAD.  There are no significant stenoses.  3. The first diagonal artery is fairly small and has minor      irregularities.  The second diagonal artery has a 20-30% stenosis      but this is certainly not flow obstructive.  4. The left circumflex artery is a large vessel and basically supplies      a large obtuse marginal artery.  There are minor luminal      irregularities.  The continuation branch is unremarkable.  5. The right coronary artery is extremely large and is dominant.      There are minor luminal irregularities but no significant stenoses.      The posterior descending artery and the posterolateral segment      artery are  normal.  6. The left ventriculogram was performed in the 30 RAO position.  It      reveals normal left ventricular systolic function with an ejection      fraction of 55%.   COMPLICATIONS:  None.   CONCLUSIONS:  1. Minor luminal irregularities.  2. Normal left ventricular systolic function.   The patient should be able to be discharged today or tomorrow.      Vesta Mixer, M.D.  Electronically Signed     PJN/MEDQ  D:  03/26/2009  T:  03/27/2009  Job:  [id number]   cc:   Cassell Clement, M.D.

## 2011-01-17 NOTE — H&P (Signed)
NAMEZAYLYN, [name]              ACCOUNT NO.:  [id number]   MEDICAL RECORD NO.:  [id number]          PATIENT TYPE:  INP   LOCATION:  1825                         FACILITY:  [facility]   PHYSICIAN:  Hollice Espy, M.D.DATE OF BIRTH:  1934-11-19   DATE OF ADMISSION:  02/21/2009  DATE OF DISCHARGE:                              HISTORY & PHYSICAL   The patient's PCP is Dr. Donia Guiles.   CHIEF COMPLAINT:  Headache and fever.   HISTORY OF PRESENT ILLNESS:  The patient is a 73-year white male with a  past medical history of diastolic dysfunction and hyperglycemia who was  in his usual state of health until yesterday evening. He was out with  friends when he started complaining of some GI upset after dinner. He  said he started feeling very weak in addition to having sudden onset of  headache. He described it as 1 of the worst headaches of his life, felt  like it was worse in the back of his head and radiating like a band  around the rest of his head.  He continued to feel very nauseous and had  several episodes of nausea, vomiting and when his symptoms did not  improve today his wife convinced him to  the come into the [facility] at  [facility] ER. There he was evaluated.  A CT scan of the head was done  which was unremarkable other than some DJD of the spine.   Labs were ordered on the patient, and he was found to have a white count  9.2 with an 87% shift.  The rest of his labs was unremarkable.  He had  some mild transaminitis with an AST of 154, ALT of 198, consistent with  previous history of fatty liver.  A urinalysis was unremarkable as was a  chest x-ray. With these concerns as well as the headache and neck pain  there is a concern about the possibility of meningitis. Because he was  at [facility] the ER physician contacted myself for admission  as well as set up with interventional radiology plans for lumbar  puncture.  The patient was brought into the emergency  room at [facility]  and there underwent a successful fluoroscopic guidance for lumbar  puncture.  CSF was sent, results of which are currently pending.  When I  saw the patient he was still complaining of a severe headache with  photophobia. He complained of neck stiffness and was unable touch his  chin to his chest.  He complained of some __________dysphagia.  No chest  pain, palpitations, shortness breath, wheeze, cough, abdominal pain,  hematuria, dysuria, constipation, diarrhea, focal extremity numbness,  weakness or pain.  Review of systems otherwise negative.   PAST MEDICAL HISTORY:  Includes diastolic dysfunction, fatty liver and  hyperglycemia.  He cannot recall some of his medicines. His wife says  she will bring them in.   ALLERGIES:  He has allergies to ERYTHROMYCIN  and SULFA.   SOCIAL HISTORY:  Denies tobacco, alcohol or drug use.   FAMILY HISTORY:  Noncontributory.   PHYSICAL EXAMINATION:  VITALS:  After he arrived to [facility]  temperature 98.3, heart rate 66, blood pressure 134/70, respirations 16,  O2 sat 95% on room air.  GENERAL: He is alert and oriented x3.  However, in distress secondary to  his headache.  HEENT: Normocephalic atraumatic.  His mucous membranes are slightly dry.  His cranial nerves are intact.  HEART:  Regular rate and rhythm.  S1 and  S2.  LUNGS: Clear to auscultation bilaterally.  ABDOMEN: Soft, nontender, nondistended.  Positive bowel sounds.  EXTREMITIES:  No clubbing, cyanosis or edema.   LAB WORK:  His CSF cultures are pending.  White count 9.2 but with an  87% shift.  H and H 16 of 47, MCV of 91, platelet count 106.  Coags  unremarkable.  CMET is noted for a glucose of 195, bilirubin 3.2, AST  154, ALT 198, albumin 3.2. Everything else is normal. Urinalysis is  essentially unremarkable noting a small amount of bilirubin, 100 of  protein, a small amount of blood.   ASSESSMENT AND PLAN:  1. Headaches, neck stiffness, subjective fever.   Suspected meningitis.      Now that he has finished his lumbar puncture will await results. In      the meantime will start IV Rocephin, acyclovir and medicines for      p.r.n. pain and nausea.  2. History of diastolic dysfunction, currently stable.  3. History of hyperglycemia.  Will need to confirm his medications.      Continue to follow.  Place in respiratory isolation.      Hollice Espy, M.D.  Electronically Signed     SKK/MEDQ  D:  02/21/2009  T:  02/21/2009  Job:  [id number]   cc:   Donia Guiles, M.D.

## 2011-01-17 NOTE — Consult Note (Signed)
NAMEHERMANN, [name]              ACCOUNT NO.:  [id number]   MEDICAL RECORD NO.:  [id number]          PATIENT TYPE:  INP   LOCATION:  2928                         FACILITY:  [facility]   PHYSICIAN:  Graylin Shiver, M.D.   DATE OF BIRTH:  05/19/1935   DATE OF CONSULTATION:  03/28/2009  DATE OF DISCHARGE:                                 CONSULTATION   REASON FOR CONSULTATION:  The patient is a 76 year old male who has been  experiencing left precordial chest pain for the past 5 days.  He was  admitted to the hospital on March 25, 2009 because of the chest pain.  He  was given some nitroglycerin which did not help and he has been  receiving.  Morphine and some Dilaudid while here in the hospital for  his chest pain.  He continues to experience the chest pain.  He had a CT  angiogram when he came in which showed heavy coronary artery  calcification and there was no evidence of a pulmonary embolus.  There  was no evidence of a thoracic aortic dissection or focal aneurysm.  There was nothing seen on the CT scan to explain the patient's pain.  The patient had a cardiac cath which although did show calcifications  the coronary arteries did not reveal any specific source for his ongoing  chest pain.  Dr. [place] consulted [place] today for a chest pain.  He feels  this is noncardiac in origin.   The patient states that he had a similar episode several years ago and  nothing specifically was found to explain the chest pain either.  He  states that he had an endoscopy done by Dr. Ewing Schlein and in reviewing that  report, he did have some antritis and gastritis.  He states that he was  told his pain may have been due to reflux.   The patient does see Dr. Carman Ching at [facility].   PAST MEDICAL HISTORY:  As above.   PAST SURGICAL HISTORY:  Multiple orthopedic procedures, back operation,  surgery on knees, wrists, and shoulders.   ALLERGIES:  SULFA, MYCINS, and ARICEPT.   SOCIAL HISTORY:  Does not smoke  or drink alcohol.   PHYSICAL EXAMINATION:  GENERAL:  He is in no distress.  EYES:  Nonicteric.  HEART:  Regular rhythm.  No murmurs.  LUNGS:  Clear.  ABDOMEN:  Soft and nontender.  No hepatosplenomegaly.  CHEST:  Palpation of the chest wall does not elicit any pain.   IMPRESSION:  Left precordial chest pain, etiology unclear.  Dr. [place],  the cardiologist, does not feel this is cardiac in nature.   PLAN:  We will schedule the patient for an EGD to see if there is  anything going on in the upper GI tract which might explain the pain.  He has already had an abdominal ultrasound which did not show any  gallstones or specific findings that explain his pain.  He also had a CT  scan of the chest which did not show anything to explain his pain.  ______________________________  Graylin Shiver, M.D.     SFG/MEDQ  D:  03/28/2009  T:  03/29/2009  Job:  [id number]   cc:   Fayrene Fearing L. Malon Kindle., M.D.

## 2011-01-19 ENCOUNTER — Encounter: Payer: Self-pay | Admitting: Cardiology

## 2011-01-20 NOTE — Cardiovascular Report (Signed)
NAMEJALEAL, SCHLIEP NO.:  [id number]   MEDICAL RECORD NO.:  [id number]          PATIENT TYPE:  INP   LOCATION:  2038                         FACILITY:  [facility]   PHYSICIAN:  Vesta Mixer, M.D. DATE OF BIRTH:  [birth year]   DATE OF PROCEDURE:  09/25/2005  DATE OF DISCHARGE:                              CARDIAC CATHETERIZATION   [name] is a 76 year old gentleman who was admitted the hospital on  Friday with episodes of chest pain. He had a spinal CT and was found to have  severe calcification of this proximal vessels. He had several episodes of  hypotension after he received nitroglycerin here in the hospital. These were  also followed by bradycardia. He is referred for heart catheterization for  follow-up evaluation.   PROCEDURE:  Left heart catheterization and coronary angiography.   The right femoral artery was easily cannulated using modified Seldinger  technique.   HEMODYNAMIC RESULTS:  The LV pressure is 130/17 with an aortic pressure of  129/63.   ANGIOGRAPHY:  1.  Left main:  The left main has mild calcifications. There are minor      luminal irregularities in the left main from 10-20%.  2.  The left anterior descending artery has minor luminal irregularities.      There is mild calcification in the proximal segment. There are several      small diagonal vessels which are normal.  3.  The circumflex artery is a relatively large vessel. It gives off several      small marginal vessels which are unremarkable. There are no significant      stenosis in the circumflex vessel.  4.  The right coronary artery is large and dominant. There are no      significant irregularities in the LAD, posterior descending artery,      posterolateral segment artery.   The left ventriculogram was performed in a 30 RAO position. It reveals mild  to moderate enlargement of the left ventricle. The left ventricular systolic  function is at the lower limits of normal  or maybe perhaps mildly depressed.  Ejection fraction between 45-50%. There is no significant mitral  regurgitation.   COMPLICATIONS:  None.   CONCLUSION:  1.  Minimal coronary artery irregularities.  2.  Left ventricular systolic function is at the lower limits of normal in      terms of systolic function. The left ventricular is mildly large. We      will continue with medical therapy. He will need further evaluation for      workup of this noncardiac chest pain.           ______________________________  Vesta Mixer, M.D.     PJN/MEDQ  D:  09/25/2005  T:  09/25/2005  Job:  [id number]   cc:   Cassell Clement, M.D.  Fax: [phone]

## 2011-01-20 NOTE — Op Note (Signed)
NAME:  [name], [name]                        ACCOUNT NO.:  [id number]   MEDICAL RECORD NO.:  [id number]                   PATIENT TYPE:  AMB   LOCATION:  DSC                                  FACILITY:  [facility]   PHYSICIAN:  Claude Manges. Cleophas Dunker, M.D.            DATE OF BIRTH:  [birth year]   DATE OF PROCEDURE:  08/20/2003  DATE OF DISCHARGE:                                 OPERATIVE REPORT   PREOPERATIVE DIAGNOSIS:  Rotator cuff tear of the supraspinatus tendon of  the left shoulder with 2 cm retraction.   POSTOPERATIVE DIAGNOSES:  1. Mild subacromial bursitis.  2. Mild subchondromalacia of glenohumeral joint.   PROCEDURE:  1. Open exploration of left rotator cuff.  2. Arthroscopic debridement of left shoulder.   SURGEON:  Claude Manges. Cleophas Dunker, M.D.   ASSISTANT:  Legrand Pitts. Duffy, P.A.   ANESTHESIA:  General endotracheal anesthesia.   COMPLICATIONS:  None.   BRIEF HISTORY:  The patient is a 76 year old gentleman who is two years  status post arthroscopic subacromial decompression for impingement with an  excellent result.  He experienced the rather acute onset of pain in his left  shoulder approximately six weeks ago while after playing golf with pain in  both the anterior and posterior aspects of his shoulder.  He had pain with  overhead motion with a little bit of weakness with external rotation because  of his pain.  It was felt that he probably had a rotator cuff tear and an  MRI scan was performed revealing a full thickness tear of the supraspinatus  tendon retracted 2 cm with a fluid filled hole.  The infraspinatus portion  of the cuff revealed some tendinopathy but no tear.  The subscapularis and  teres minor appeared intact.  The glenohumeral joint was unremarkable and  the biceps tendon appeared normally located.  Because he had a previous  arthroscopic subacromial decompression, it was felt that he required  exploration of the rotator cuff and repair of the rotator cuff  tear.  He is  to have that procedure today.   DESCRIPTION OF PROCEDURE:  With the patient comfortable on the operating  table and under general orotracheal anesthesia the patient was placed in a  semi-sitting position with a shoulder frame.  The left shoulder was then  prepped with Duraprep from the base of the neck circumferentially below the  elbow.  Sterile draping was performed.  I elected to perform a mini open  repair of the rotator cuff and about an inch and a half incision was made at  the junction of the anterior and lateral aspect of his shoulder beginning at  the anterior acromion extending distally.  Via sharp dissection, the  incision was carried down to the subcutaneous tissue.  The deltoid bursa was  identified and incised with a Bovie and via blunt dissection the fibers of  the deltoid muscle were then carefully separated and a retractor  was  inserted.  The subacromial space was entered.  There was an excellent  previous decompression without evidence of impingement.  I carefully  inspected the entire cuff including the subscapularis, the infra and  supraspinatus and I could not identify a rotator cuff tear.  I was able to  finger palpate all the way into the back of the subacromial space and could  not find a defect.  There was some bursa tissue which I had resected and  there was some evidence of bursal tissue in the posterior recesses which I  also released, but I could not find any evidence of impingement or rotator  cuff pathology.  I thought the cuff was in excellent condition.   At that time point I elected to arthroscope the joint to be sure there was  not any intra-articular pathology that may have bene missed by the MR scan.  At a point a fingerbreadth posterior and medial to the posterior angle of  the acromion, a small puncture site was made.  The arthroscope was easily  placed into the shoulder. Arthroscopy revealed an intact biceps tendon.  There was very  minimal synovitis.  There was an area of chondromalacia in  the inferior half of the glenoid with a several mm area of cartilage loss  and there was some mild chondromalacia, probably grade 1 or 2 changes in the  humeral head.  The recess was clear.  The labrum was carefully evaluated  from the anterior posterior in the biceps anchor and it was perfectly  intact.  There was a partial tear of the subscapularis tendon and I  carefully evaluated the rotator cuff from the joint surface along its  attachment to the humeral head and did not see any evidence of a tear.  I  had left the mini incision open during the arthroscopy to see if I could  visualize any sterile saline exuding from an occult tear that I could not  see or feel and there was none.  The arthroscopy equipment was removed along  with the saline solution.  I did debride some fraying of the labrum for  better visualization and again did not feel that there was a labral tear.   I then irrigated the anterior insertion.  The deltoid fascia was closed with  a running 0 Vicryl.  The subcutaneous was not closed.  The skin was closed  with skin clips.  0.25% Marcaine with epinephrine was injected into the  operative site.  The patient did have a supplemental interscalene block.  A  sterile bulky dressing was applied followed by a sling.   PLAN:  Percocet and to return to the office in one week.                                               Claude Manges. Cleophas Dunker, M.D.    PWW/MEDQ  D:  08/20/2003  T:  08/21/2003  Job:  [id number]

## 2011-01-20 NOTE — Discharge Summary (Signed)
. [facility]  Patient:    [name], [name]                     MRN: [id number] Adm. Date:  [id number] Disc. Date: [id number] Attending:  Randolm Idol Dictator:   Jamelle Rushing, P.A.                           Discharge Summary  ADMISSION DIAGNOSES: 1. Osteoarthritis right hip and left knee. 2. History of asthma.  DISCHARGE DIAGNOSES: 1. Status post right total hip arthroplasty. 2. Left knee osteoarthritis. 3. History of asthma. 4. Postoperative blood loss anemia. 5. Pulmonary collapse. 6. Aspiration pneumonia.  HISTORY OF PRESENT ILLNESS:  This is a 76 year old male with a three-year history of right hip pain.  The pain is significantly worse over the last one year.  The pain presently is unbearable, difficulty with sitting and lying down.  The patient is awakened from sleep and extremely painful with walking. The pain is in the lateral aspect of the hip, into the right groin.  There is no radiation, no numbness or tingling down the leg.  When off the feet, the pain is described as 8/10, but while walking it is a 10/10.  The pain does have some sharp, stabbing sensations with ambulation and twisting of the hip. There is crepitus.  Last x-ray showed bone on bone.  ALLERGIES:  SULFA, TETRACYCLINE, and ______ .  CURRENT MEDICATIONS: 1. Vioxx 25 mg p.o. q.d. 2. Iron 325 mg p.o. b.i.d. 3. Saw palmetto. 4. Glucosamine chondroitin.  OPERATIONS:  On November 03, 1999, the patient was taken to the OR by Dr. Norlene Campbell, assisted by Dr. Vear Clock.  Under general anesthesia, the patient had a right total hip replacement performed.  The patient tolerated the surgical procedure well, without any complications.  No drains left in place.  COMPLICATIONS:  None.  CONSULTATIONS:   On November 03, 1999, the following routine consults were requested: 1. Physical therapy. 2. Occupational therapy. 3. Rehabilitation. 4. Pharmacy for Coumadin  dosing. 5. On November 04, 1999, Dr. Fonnie Birkenhead office was requested for evaluation of    patient for his nausea, and no evidence of aspiration.  Chest x-ray    indicated left lower lobe atelectasis.  HOSPITAL COURSE:  On November 03, 1999, the patient was admitted to [facility] under the care of Dr. Norlene Campbell.  He was taken to the OR, where a right total hip arthroplasty was performed under general anesthesia.  There were no complications.  The patient was transferred to the recovery room and then to the orthopedic floor with no problems.  Later that evening, the patient was comfortable with the morphine PCA.  He was having difficulty voiding, so a Foley catheter was inserted.  On postoperative day #1, the patients t-max was 100.7.  The patient did not get much sleep the night before.  Right hip feels tight.  Dressing was clear. Thigh not significantly edematous.  Distal leg was neuromotor vascularly intact.  The patient denied any shortness of breath or chest pain, and was now not having any problems with voiding due to the Foley catheter in place.  The plan today was for the patient to start physical therapy and check laboratories.  H&H was stable at 12.0 and 34.3, and INR was 1.4.  Later in the evening, about 7:50 in the evening, the patient was found by nursing staff to  be shaking very violently.  Blood pressure was 130/70, saturations of 81% on 2 L, heart rate of 102, respirations 14, temperature 102.2.  The patient was not responding to verbal cues.  The patient had an ABG, chest x-ray, and EKG performed, and Dr. Modesto Charon from [facility] was consulted for evaluation of the patients medical condition at this particular time.  Medicine came in and evaluated the patient, and he was found to be slightly lethargic, would answer to yes or no questions.  Lung sounds with possible rhonchi left base.  No calf tenderness.  Neck was supple.  Chest x-ray revealed left lower lobe  atelectasis, and it was felt that the patients fever and hypoxia were as a result of left lower lobe atelectasis which seemed consistent with pneumonitis.  He doubted PE.  He, at this time, started the patient on Zosyn.  On postoperative day #3, medicine found the patient feeling better today. Felt like a little sore, and indicated that he felt like a zombie the night before but was much better today.  After further evaluation, medicine felt that it was pneumonitis of the left lower lobe versus atelectasis, and did not feel that any change on the current treatment was needed.  Orthopedically, the patient was awake and alert, t-max was 102.2, all other vital signs stable.  Lung sounds were clear to auscultation, with questionable crackles in left lower lobe.  H&H was stable.  INR was 2.0.  Orthopedically, the patient was doing very well.  The patient had his PCA discontinued today. The Foley catheter was also discontinued.  The patient was placed on Percocet for pain, and we continued monitoring the patients medical condition.  On postoperative day #4, the patient from the medical standpoint was improving and without any further setbacks.  Their recommendations on this date were encouragement of incentive spirometer use and continued current treatment and with recheck of a chest x-ray on the following date.  Orthopedically, the patient was very stable on this day.  The INR did take a jump up to 4.7, but this was possibly felt due to the Zosyn, so this would be adjusted today.  The patient was continued in physical therapy per routine protocol.  On postoperative day #5, the patient was without any complaints.  Was feeling much better today, with no shortness of breath or cough.  The patient did have a bowel movement.  H&H did drop to 9.8 and 27.0, but initially early in the morning the patient had no complaints of lightheadedness or dizziness, but later in the day he just did feel like  he was washed out and very weak, so he was transfused two units of autologous blood for his anemia.  Otherwise, the  patients respiratory status continued to improve.  He continued with physical therapy per routine protocol, and his IV antibiotics were changed to Augmentin to help see if this would prevent any interaction with the Coumadin.  On postoperative day #6, the patient continued feeling a little bit better, was up in the chair, his pain was well controlled on his current medications. His H&H was 11.0 and 31.6, with 2.3 INR.  He had no complaints of any respiratory symptoms whatsoever.  The patients chest x-ray on the previous day showed worsening of his left lower lobe patchy pneumonitis.  His O2 saturation did drop to 86% during the night on room air, so medicine did feel that the patient was not ready for discharge.  The patient was continued with O2,  multidose inhalers, and the p.o. Augmentin, and they would once again recheck a chest x-ray the following day.  On postoperative day #6, the patient continued to progress very well.  He was afebrile for three days now.  Vital signs were stable.  He was sleeping very well.  He was not lethargic, no shortness of breath, no calf tenderness.  His distal leg was neuromotor vascularly intact.  The patient was out of bed very actively with physical therapy, and the plan today was to discharge him home once he was cleared by medicine.  After medical evaluation, the patient was found to have bilateral lower lobe pneumonia on chest x-ray, revealing infiltrates, and it was felt that he would be okay to be discharged to home with Augmentin for five more days.  The patient was, in fact, discharged to home on this date.  DISCHARGE MEDICATIONS: 1. Augmentin 875 p.o. b.i.d. for five days. 2. Humibid L.A. 2 tablets b.i.d. for five days. 3. Coumadin 5 mg once a day until changed by pharmacy. 4. Combivent MDI 2 puffs three times a day. 5.  Nasonex 2 puffs b.i.d. 6. Colace 100 mg p.o. q.d. 7. Iron 325 mg p.o. b.i.d. 8. Protonix 40 mg q.d.  ACTIVITY:  The patient is to weightbear 50% of body weight on right leg with the use of a walker.  WOUND CARE:  The patient is to check wound daily and to check for infection.  FOLLOW-UP:  The patient is to check with Dr. Arvilla Market Wednesday, November 16, 1999, at 8 a.m.  Dr. Cleophas Dunker is to have a follow-up appointment on November 14, 1999.  LABORATORY DATA:  EKG on admission was normal sinus rhythm at 61 beats.  Chest x-ray on November 04, 1999, shows mild basilar atelectasis, particularly on the left.  On November 07, 1999, chest x-ray impression shows worsening patchy bibasilar lung density.  On November 09, 1999, chest x-ray shows probably negative chest for active disease, with improved aeration and less prominent markings in the bases.  CBC:  On November 08, 1999, WBC was 5.4, hemoglobin 11.0, hematocrit of 31.6, and 148 platelets.  Coagulation studies:  On November 09, 1999, PT was 19.5, with a 2.2 INR.  BMET on November 05, 1999, showed sodium of 135, potassium of 3.5, chloride 99, CO2 29, glucose 142, BUN 9, creatinine 0.9, and calcium of 8.3. Urinalysis on November 04, 1999, shows everything negative, with the exception of small leukocytes and a few bacteria.  Urine culture showed no growth after one day.  The patient received two units of autologous blood during hospitalization.  DISCHARGE MEDICATIONS: 1. Nasonex spray 1 spray b.i.d. 2. Combivent MDI 2 puffs p.o. q.i.d. 3. Humibid 2 tablets q.12h. 4. Augmentin 875 p.o. b.i.d. 5. Protonix 40 mg p.o. q.d. 6. Ferrous sulfate 325 mg p.o. b.i.d. 7. Coumadin per pharmacy dosing. 8. Percocet 1-2 tablets p.o. q.4-6h. p.r.n. pain.  CONDITION ON DISCHARGE:  Improved and good. DD:  12/27/99 TD:  12/27/99 Job: [id number]

## 2011-01-20 NOTE — Discharge Summary (Signed)
NAMESEANPAUL, [name]              ACCOUNT NO.:  [id number]   MEDICAL RECORD NO.:  [id number]          PATIENT TYPE:  INP   LOCATION:  2038                         FACILITY:  [facility]   PHYSICIAN:  Cassell Clement, M.D. DATE OF BIRTH:  May 06, 1935   DATE OF ADMISSION:  09/22/2005  DATE OF DISCHARGE:  09/27/2005                                 DISCHARGE SUMMARY   FINAL DIAGNOSES:  1.  Chest pain probably musculoskeletal chest wall pain.  2.  Gastritis.  3.  Diffuse fatty infiltration of liver.  4.  Hyperglycemia.  5.  Osteoarthritis.  6.  Coronary artery calcification by CT scan with cardiac catheterization      demonstrating no significant obstructive coronary disease.  7.  Mild to moderate left ventricular hypertrophy with diastolic dysfunction      by echocardiogram.   OPERATIONS PERFORMED:  1.  Cardiac catheterization.  2.  2-D echocardiogram.  3.  Upper endoscopy.   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 He had the  onset of chest pain at approximately two to three weeks ago which has been  progressively getting worse. He had an equivocal response to sublingual  nitroglycerin. The patient had a two day treadmill Cardiolite stress test  which was abnormal because of worsening chest pain and multiple PVCs and  poor exercise tolerance but he did not develop any ischemic ST-segment  changes and there were no perfusion abnormalities and his an ejection  fraction was normal at 55%. He had an outpatient spiral CT scan of his chest  which was negative for pulmonary embolism but showed moderate to extensive  coronary artery calcification involving all three main coronary arteries  including the left main. The study also showed a small hiatal hernia. The  patient therefore was admitted for suspected unstable angina pectoris. He  was initially admitted to 3700. Shortly after arrival because of  worsening  pain, he had been given three sublingual nitroglycerins and developed  asystole and a code blue was called. The telemetry showed transient asystole  and then his rhythm returned on its own and he did not require chest  percussion. The subsequent cardiac enzymes were negative for myocardial  infarction. The patient was admitted on a Friday. He was transferred that  evening after his period of asystole over to the Transitional Care Unit for  closer observation. It was felt that he had a severe vasovagal response to  nitroglycerin causing the asystole and syncope. On the Transitional Care  Unit, the patient continued to have ongoing pain which was difficult to  relieve. We gave him IV nitroglycerin, IV heparin, beta blockers trial of  Nexium, trial IV morphine and kept him on his aspirin. He waited over the  weekend and pain became slightly improved each day. EKG's continued to be  totally normal showing no ischemia. By Monday morning, the cath lab had  reopened and he underwent cardiac catheterization by Dr. Kristeen Miss on  September 25, 2005. He was found to have only minor irregularities and no  obstructive lesions.  An ejection fraction was about 50%. With the cardiac  cath being negative, we also obtained a gallbladder ultrasound and liver  ultrasound and this showed that the gallbladder, spleen, kidneys abdominal  aortic and inferior vena cava all and normal appearance. There were no  gallstones and no biliary ductal dilatation. He did have a diffusely  echogenic liver, most likely due to fatty infiltration and the pancreatic  head and tail were poorly visualized. The patient was seen by [facility] who  felt that he would benefit from an endoscopy to try to determine the cause  of his atypical chest pain. This was performed by Dr. Ewing Schlein September 27, 2005  and showed moderate gastritis of the stomach and the esophagus was within  normal limits. Dr. Ewing Schlein recommended continuing  proton pump inhibitors. The  patient also had an echocardiogram which as noted showed no significant  valvular lesions and showed mild to moderate left ventricular hypertrophy  with normal systolic function and with abnormal left ventricular relaxation.   It was felt that the patient had reached maximum hospital benefit on the  evening of September 27, 2005 and was ready for discharge. The possibility of  a nonfunctional gallbladder was raised by the patient's daughter who  apparently had similar symptoms and it turned out to be a nonfunctioning  gallbladder found on a PIPIDA scan. The patient had a radio-isotope bone  scan during this hospitalization and so the PIPIDA scan would have to be  postponed for three to five days. This can be done as an outpatient if  clinically indicated. Of note is that the bone scan did not show any tracer  uptake in the area of the chest or ribs. He does have a right hip prosthesis  and bilateral knee degenerative changes but it was otherwise unremarkable.  No nuclear medicine whole body bone scan.   The patient is being discharged in improved on the following medication;  Nexium 40 milligrams one daily, Ecotrin 325 milligrams daily, Lopressor 25  milligrams twice a day, K-Dur 20 mEq daily, ibuprofen 200 milligrams if  needed for pain and Nitrostat 1/50 if needed and he will use that  judiciously in view of his previous vasovagal reaction. Of note is the fact  that on admission his potassium of 3.3 and prior to discharge was 3.8. Other  labs of note are normal hemoglobin on admission of 17, reflecting some  degree of dehydration and at discharge hemoglobin 12.6, hematocrit 35.2,  white count 7400. Coag studies were normal. Total bilirubin is 1.8  consistent with Gilbert's disease with other liver functions being normal.  Cardiac enzymes were negative. His cholesterol was 160, LDL 105, HDL 40, triglycerides 77. Urinalysis was unremarkable. His sed rate was 2  and a PSA  level was done and was normal at 1.91.   The patient's blood sugars during this hospitalization were slightly  elevated at 106, 119 and 115 and he was advised to maintain a low  carbohydrate, low-cholesterol diet, particularly in view of his fatty liver  by ultrasound. The patient will be rechecked in the office in 7-10 days to  assess response to therapy and will get a BMET to look at his potassium  again. The plan would be try to get his LDL down in the 70 range with diet  alone at if that fails to add a Statin later since he does have coronary  artery calcification without stenosis at this point. He is going to try  exercise and  diet first to get his LDL down and if that fails, we will add a  Statin in several months.   CONDITION ON DISCHARGE:  Improved.           ______________________________  Cassell Clement, M.D.     TB/MEDQ  D:  09/27/2005  T:  09/27/2005  Job:  [id number]   cc:   Petra Kuba, M.D.  Fax: [phone]   Donia Guiles, M.D.  Fax: [phone]   Vesta Mixer, M.D.  Fax: [phone]

## 2011-01-20 NOTE — Op Note (Signed)
[place]  Patient:    [name], [name]                     MRN: [id number] Proc. Date: 11/03/99 Adm. Date:  [id number] Attending:  Randolm Idol                           Operative Report  PREOPERATIVE DIAGNOSIS:  End-stage osteoarthritis of right hip.  POSTOPERATIVE DIAGNOSIS:  End-stage osteoarthritis of right hip.  PROCEDURE:  Right total hip replacement.  SURGEON:  Claude Manges. Cleophas Dunker, M.D.  ASSISTANT:  Jerolyn Shin. Tresa Res, M.D.  ANESTHESIA:  General orotracheal.  COMPLICATIONS:  None.  COMPONENTS:  Depuy AML Prodigy 15 mm large stature with a 28 mm head and 1.5 mm  neck length, a 60 mm outer diameter acetabular shell with a 10 degree polyethylene liner and apex hole eliminator.  DESCRIPTION OF PROCEDURE:  With the patient comfortable on the operating table nd under general orotracheal anesthesia, the patient was placed in the lateral decubitus position with the right side up.  The patient was secured on the operating room table with the Innomed hip system.  The right hip was then prepped with Betadine scrub and then DuraPrep from the iliac crest circumferentially to the mid calf.  Sterile draping was performed.  A routine ______ incision was utilized via sharp dissection and carried down to  the subcutaneous tissue.  Gross bleeders were Bovie coagulated.  There was a large amount of adipose tissue that was incised, and the iliotibial band was identified and incised along with the skin incision.  The hip was internally rotated with ome difficulty because of the contractures.  The short external rotators were identified and the structures were tagged with 0 Tycron suture.  The capsule was identified.  Both the short external rotators and the capsule was incised on the femoral neck and head.  There was probably 3-4 cc of clear yellow joint effusion.  The head was dislocated posteriorly.  It was significant malformed  with large osteophytes.  The AML hip guide was used to make the appropriate angle in the calcar.  The head was then removed from the wound.  Retractors were placed about the femoral shaft.  A starter hole was made ______  followed by the canal finder.  Reaming was performed at 14.5 to accept a 15 mm prosthesis.  Rasping was performed to 15 mm and the calcar reamer was utilized.  Retractors were placed about the acetabulum.  There were large osteophytes, several of which were removed for better visualization.  The soft tissue was removed from around the periphery.  Reaming was performed to 59 mm to accept a 60 mm prosthesis.  We tried the 60, nd felt that we would have good rim fit, and it would not seat completely.  The final 60 mm outer diameter acetabular component with 100 series was impacted. It fit snugly, and completely within the acetabulum.  The apex hole eliminator as inserted and the trial polyethylene component was then applied.  The rasp was reinserted and we trialed several neck lengths using a 20 mm hip ball. We felt we had some instability with adduction, flexion, and internal rotation, so we trialed the plafond ________ felt this was completely stable, and then we reestablished the leg lengths, and the leg was approximately 1/2 inch short preoperatively.  All the trial components were removed and the joint was copiously irrigated  with saline solution and antibiotic solution.  The final polyethylene component was impacted followed by the 15 mm large stature Prodigy stem.  We then trialed a  +1.5 mm neck and we had excellent stability.  The wound was irrigated.  The final 28 mm hip ball was then impacted with a 1.5 mm neck.  The joint was inspected.  It was clear and the joint was then reduced. Again, through a full range of motion in both flexion and extension, there was o instability, and we could not sublux the joint.  The wound was again  irrigated ith antibiotic solution and saline solution.  The capsule was closed anatomically with #1 Ethibond.  The short external rotators were reapproximated anatomically with a similar material.  The iliotibial band was closed with a running 0 Vicryl and the subcutaneous closed in several layers with 0 and 2-0 Vicryl.  The skin closed with skin clips.  A sterile bulky dressing was applied followed by knee immobilizer.  The patient tolerated the procedure without complications. DD:  11/03/99 TD:  11/04/99 Job: [id number]

## 2011-01-20 NOTE — Discharge Summary (Signed)
NAME:  [name], [name]              ACCOUNT NO.:  [id number]   MEDICAL RECORD NO.:  [id number]          PATIENT TYPE:  INP   LOCATION:  5011                         FACILITY:  [facility]   PHYSICIAN:  Corinna L. Lendell Caprice, MDDATE OF BIRTH:  June 15, 1935   DATE OF ADMISSION:  02/21/2009  DATE OF DISCHARGE:  02/25/2009                               DISCHARGE SUMMARY   DISCHARGE DIAGNOSES:  1. Headache and neck stiffness, resolved.  2. History of diastolic dysfunction  thrombocytopenia.  1. History of hyperglycemia.   DISCHARGE MEDICATIONS:  Dilaudid 2-4 mg every 4 hours as needed for  pain, Tylenol or ibuprofen as needed for pain.  Continue the rest of his  home medications.   Follow with Dr. Sharene Skeans if headache returns.  Followup with Dr.  Arvilla Market or Patty Sermons for platelet count in August.   CONDITION:  Stable.   CONSULTATIONS:  Neurology.   PROCEDURES:  Lumbar puncture.   Increase activity slowly.   Diet as tolerated.   LABORATORY DATA:  CBC significant for platelet count of 86,000 on  admission.  At discharge, platelet count is 104,000.  Erythrocyte  sedimentation rate normal at 13.  Basic metabolic panel significant for  a glucose of 123, hemoglobin A1c was 5.  First tube of CSF showed 365  red cells, 3 white cells, fourth tube showed 4 red cells, 0 white cells.  CSF protein normal at 35.  CSF glucose 73.  Gram stain of CSF showed no  white cells or organisms.  Culture is negative.   SPECIAL STUDIES:  Radiology:  Chest x-ray on admission showed  cardiomegaly, nothing acute.  CT of the C-spine showed multilevel  degenerative changes,at least mild spinal stenosis C4-5, nothing acute.  CT brain without contrast showed normal appearance of brain for age.  MRI of the brain with and without contrast was normal.  MRV of the head  was negative with incidental dominant right transverse sinus.   HISTORY AND HOSPITAL COURSE:  Please see H and P for complete admission  details.   Briefly, the patient is a 76 year old white male who presented  with weakness, sudden headache, nausea and vomiting.  He described it as  one of the worst headaches of his life but seemed different from his  previous migraines.  He was transferred from the [facility] [place] to [facility].  There was concern about meningitis and  lumbar puncture was done.  This was unremarkable.  The patient had been  started empirically on Rocephin and acyclovir pending results.  Neurology was consulted and recommended migraine treatment.  He was  started on DHEA and steroids, also Depakote.  The antibiotics and  antiretrovirals were stopped after LP results returned.  The patient's  neck stiffness and headache improved.  He did have an episode that he  described as hallucinations, but this was not witnessed.  I suspect it  was steroid related and did not return.  At the time of discharge, he  had been cleared by Neurology.  He was feeling better and stable for  discharge.      Corinna  Burman Freestone, MD  Electronically Signed     Corinna L. Lendell Caprice, MD  Electronically Signed    CLS/MEDQ  D:  04/15/2009  T:  04/15/2009  Job:  [id number]

## 2011-01-20 NOTE — H&P (Signed)
[name], [name]              ACCOUNT NO.:  [id number]   MEDICAL RECORD NO.:  [id number]          PATIENT TYPE:  INP   LOCATION:  2905                         FACILITY:  [facility]   PHYSICIAN:  Cassell Clement, M.D. DATE OF BIRTH:  09-04-1935   DATE OF ADMISSION:  09/22/2005  DATE OF DISCHARGE:                                HISTORY & PHYSICAL   CHIEF COMPLAINT:  Chest pain.   HISTORY OF PRESENT ILLNESS:  This is a 76 year old, married, Caucasian  gentleman, retired Emergency planning/management officer who is admitted with worsening chest pain.  He began having onset of chest pain about 2-3 weeks ago and has becomes  progressively more severe.  He has had an equivocal response to sublingual  nitroglycerin.  We initially saw him in the office for consultation  concerning this problem on September 14, 2005, and at that time his  electrocardiogram showed normal sinus rhythm with occasional PVCs, but no  ischemic changes.  His chest x-ray showed borderline cardiomegaly with a CT  ratio of 16/32 and clear lungs.  He subsequently underwent a 2-day treadmill  Cardiolite stress test on September 20, 2005.  He exercised for 3-1/2 minutes.  He had mild chest pain prior to exercising and with exercise developed  worsening chest pain which necessitated stopping the treadmill at 3-1/2  minutes.  The patient reached his maximal heart rate of 140 and did not have  any ischemic ST-segment depression, but did have frequent PVCs.  The  perfusion study was normal.  The patient has continued to have chest  discomfort and today underwent a spiral CT scan of the chest using pulmonary  embolism protocol.  No filling defects were identified to suggest pulmonary  embolism and there was no evidence of an aneurysm or dissection, but the  heart was enlarged with left atrial as well as left ventricular enlargement.  He was noted on CT to have moderate to extensive coronary artery  calcification present involving all three main coronary  arteries including  the left main.  There was also mitral annular calcification present.  The  remainder of the study was unremarkable and the pulmonary parenchyma was  clear.  He was also found to have a small hiatal hernia.  Because of the  presence of extensive coronary artery calcification and continued pain, it  was felt prudent to advise hospitalization for further evaluation including  coronary angiography.  The patient was admitted therefore to the hospital  for further evaluation and shortly after admission had increasing chest pain  and was given three sublingual nitroglycerin after which he had a short  period of asystole and hypotension.  He responded to IV fluids.  His EKG  prior to getting nitroglycerin was normal and two EKGs post syncopal episode  are normal showing no ischemia.  However, because of the patient's unstable  status, he is being transferred from 300 down to the TCU for closer  observation.   FAMILY HISTORY:  The family history reveals that his father died of suicide.  Mother still living at age 52.  The patient has a brother living and another  brother who died of cancer.  The patient is married and has two daughters.   SOCIAL HISTORY:  He does not use alcohol or tobacco.  He is retired from the  police force.  He enjoys playing golf.   PAST SURGICAL HISTORY:  He has had multiple orthopedic operations including  three or four back operations as well as surgery on his knees, wrist and  shoulder.   ALLERGIES:  SULFA and MYCINS.   REVIEW OF SYSTEMS:  He has otherwise negative review of systems.  He is not  having any cough or sputum production or hemoptysis.  The chest discomfort  is in the left chest and does not radiate to the arms.  It feels like  indigestion, but not related to food intake.  Of note is the fact that lab  work in our office had shown normal liver function studies except for a  bilirubin of 1.9 and a normal CBC.   PHYSICAL EXAMINATION:   VITAL SIGNS:  On physical exam, his blood pressure is  120/80, pulse is 60 and regular, respirations are normal.  HEENT:  Color is fair.  Jugular venous pressure normal.  The head and neck  is unremarkable.  Fundi show no hemorrhages or exudates.  Mouth and pharynx  normal.  Carotids normal.  Thyroid normal.  CHEST:  Clear.  HEART:  Reveals no murmur, gallop, rub or click.  ABDOMEN:  Abdomen is soft and nontender.  EXTREMITIES:  Extremities show no phlebitis or edema.   ASSESSMENT:  He is being admitted now for further observation.   PLAN:  He will be treated with IV nitroglycerin, IV heparin, aspirin, beta-  blockers, Nexium.  Serial enzymes will be obtained.  Anticipate cardiac  catheterization Monday or sooner if the patient becomes unstable.           ______________________________  Cassell Clement, M.D.     TB/MEDQ  D:  09/22/2005  T:  09/23/2005  Job:  [id number]   cc:   Donia Guiles, M.D.  Fax: [phone]

## 2011-01-20 NOTE — Consult Note (Signed)
NAMEALDRIDGE, KRZYZANOWSKI              ACCOUNT NO.:  [id number]   MEDICAL RECORD NO.:  [id number]          PATIENT TYPE:  INP   LOCATION:  3013                         FACILITY:  [facility]   PHYSICIAN:  Stefani Dama, M.D.  DATE OF BIRTH:  [birth year]   DATE OF CONSULTATION:  DATE OF DISCHARGE:                                 CONSULTATION   REFERRING PHYSICIAN:  Dr. Mariel Aloe.   REASON FOR REQUEST:  Right upper extremity pain, herniated nucleus  pulposus.   Mr. Mclinden is a 76 year old individual who 2 months ago underwent  surgical decompression of a disk herniation T1-T2 on the right.  He did  well and was discharged from my care and the patient developed severe  pain with an acute onset this past Friday. The pain has not been getting  better despite the passage of time. He notes that it is in the same  distribution. He has been admitted to the emergency room and has  undergone MRI of the neck which demonstrates that there is recurrence of  the disk herniation at T1-T2 level on the right side. He will now be  admitted to the hospital and need to undergo surgical intervention. For  other details of his past medical history, social history please see the  history and physical examination dictated under separate title.      Stefani Dama, M.D.  Electronically Signed     HJE/MEDQ  D:  07/30/2006  T:  07/31/2006  Job:  [id number]

## 2011-01-20 NOTE — Consult Note (Signed)
[name], [name]              ACCOUNT NO.:  [id number]   MEDICAL RECORD NO.:  [id number]          PATIENT TYPE:  INP   LOCATION:  2038                         FACILITY:  [facility]   PHYSICIAN:  Petra Kuba, M.D.    DATE OF BIRTH:  06-12-1935   DATE OF CONSULTATION:  09/26/2005  DATE OF DISCHARGE:                                   CONSULTATION   HISTORY OF PRESENT ILLNESS:  The patient seen by Dr. Patty Sermons for atypical  chest pain. So far, he has had a non-diagnostic catheterization, spiral CT  scan, and gallbladder ultrasound. The pain is actually better but for a few  days it felt like he had an elephant sitting on his chest. He has had once a  week indigestion. Takes a little Tums or Rolaids but has had no previous GI  workup or test except for screening colonoscopies with some small polyps  found.   PAST MEDICAL HISTORY:  Pertinent for multiple orthopedic operations with  some back surgeries; knees, wrists, and shoulder surgeries.   FAMILY HISTORY:  Negative for any obvious GI problem except for his mother  having ulcers at an elderly age.   SOCIAL HISTORY:  Does not use alcohol or tobacco.   HOME MEDICATIONS:  Aspirin only.   CURRENT MEDICATIONS:  In the hospital, he has been on aspirin, Protonix,  Lopressor, potassium, Benadryl, Valium, Lasix, nitroglycerin, morphine,  Darvocet, Ibuprofen, antacids, and some Xanax.   REVIEW OF SYSTEMS:  Negative except as above.   PHYSICAL EXAMINATION:  GENERAL:  No acute distress.  VITAL SIGNS:  Stable. Afebrile.  CHEST:  No wall tenderness.  ABDOMEN:  Soft, nontender.   LABORATORY DATA:  Pertinent for normal BUN and creatinine. CBC on admission  normal. In the hospital, his hemoglobin has dropped to 12.6. Platelet count  is 130,000. Was 166,000. PT 14.8. Normal sed rate. Liver tests normal except  for a bilirubin of 1.8 with a normal albumin.   Gallbladder ultrasound normal as above. May be fatty infiltration of the  liver. CT  scan did reveal a small hiatal hernia.   ASSESSMENT:  Atypical chest pain.   PLAN:  The risks, benefits, methods of an endoscopy versus a barium study  were discussed. We compared it to the colonoscopy that he has had. Offered  him outpatient workup but prefers to proceed tomorrow with further workup  and plans pending those findings.           ______________________________  Petra Kuba, M.D.     MEM/MEDQ  D:  09/26/2005  T:  09/26/2005  Job:  [id number]   cc:   Fayrene Fearing L. Malon Kindle., M.D.  Fax: [phone]

## 2011-01-20 NOTE — Op Note (Signed)
NAMEJAMESYN, [name]              ACCOUNT NO.:  [id number]   MEDICAL RECORD NO.:  [id number]          PATIENT TYPE:  INP   LOCATION:  3013                         FACILITY:  [facility]   PHYSICIAN:  Stefani Dama, M.D.  DATE OF BIRTH:  1935-08-14   DATE OF PROCEDURE:  07/30/2006  DATE OF DISCHARGE:                               OPERATIVE REPORT   PREOPERATIVE DIAGNOSIS:  Herniated nucleus pulposus T1-T2 with right  thoracic radiculopathy.   POSTOPERATIVE DIAGNOSIS:  Herniated nucleus pulposus T1-T2 with right  thoracic radiculopathy.   PROCEDURE:  Laminotomy and diskectomy T1-T2 right with operating  microscope, sitting position.  METRx approach.   SURGEON:  Stefani Dama, M.D.   FIRST ASSISTANT:  None.   ANESTHESIA:  General endotracheal.   INDICATIONS:  [name] is a 76 year old individual who has had  significant recurrence of pain in the right neck, shoulder and arm.  Two  months ago he underwent posterior diskectomy via METRx procedure,  tolerated this well and was in fact returning to his normal activities  when he rather suddenly developed a recurrence of pain of the same  severity.  He was taken to the operating room to undergo excision of  recurrent disk herniation.   PROCEDURE:  The patient was brought to the operating room, previously  placed central venous catheter was positioned right jugular vein.  He  was placed under general endotracheal anesthesia, placed in the three-  point headrest and then upright into the seated position and back of the  neck was prepped with alcohol and DuraPrep and draped in sterile  fashion.  Previous incision was opened with #11 blade and then  dissection was carried down to allow the placement of a K-wire into the  space at T1-T2 level.  Wanding technique was again used to widen this  area and expose the region around T1-T2.  Scar tissue in this area was  somewhat difficult to release and ultimately a 22 mm diameter  cannula  was measuring 6 cm deep was placed into the wound.  The operating  microscope was then brought into the field and with care the soft  tissues were dissected around this area being careful to expose the  borders of the laminotomy then the soft tissue was removed in a  piecemeal fashion using a one-half and 2 mm Kerrison punch.  Dissection  was then carried down further and ultimately the nerve root was  identified in its travel out the foramen.  Area above the nerve root was  secured first and scar tissue was released here.  There was noted be a  significant amount of epidural fibrosis on the dorsal surface of the  nerve and this had to be released very carefully because of adhesions to  it and the common dural tube.  On the undersurface of the nerve,  however, there was noted to be some bulging and on the medial aspect,  the bulging when opened was found to contain a fragment of disk.  A  single small fragment was removed.  Further exploration revealed two  other medium-sized fragments and once  these were removed, no other  fragments could be elicited.  Care was taken to protect the nerve root  and dissect along the foramen out the nerve roots to make sure that it  was well decompressed.  In the end it was.  Hemostasis from substantial  epidural bleeding was controlled with some bipolar cautery very  carefully and then pledgets of Gelfoam soaked in thrombin which were  later irrigated away.  Once good hemostasis was established, surgical  endoscope  was removed.  The fascia in the cervical spine was reapproximated with 3-  0 Vicryl in interrupted fashion, 3-0 Vicryl was used subcuticularly to  close the skin and Dermabond was placed on the skin.  Blood loss was  estimated less than 30 mL.  The patient was returned to recovery room in  stable condition.      Stefani Dama, M.D.  Electronically Signed     HJE/MEDQ  D:  07/30/2006  T:  07/31/2006  Job:  [id number]

## 2011-01-20 NOTE — Op Note (Signed)
[name]              ACCOUNT NO.:  [id number]   MEDICAL RECORD NO.:  [id number]          PATIENT TYPE:  AMB   LOCATION:  SDS                          FACILITY:  [facility]   PHYSICIAN:  Stefani Dama, M.D.  DATE OF BIRTH:  02-11-1935   DATE OF PROCEDURE:  06/05/2006  DATE OF DISCHARGE:                                 OPERATIVE REPORT   PREOPERATIVE DIAGNOSIS:  Herniated nucleus pulposus T1-T2 on the right, with  right T1 radiculopathy.   POSTOPERATIVE DIAGNOSIS:  Herniated nucleus pulposus T1-T2 on the right,  with right T1 radiculopathy.   PROCEDURE:  Med[employer]rex Microdiskectomy T1-T2 on the right, with operating  microscope microdissection technique.   SURGEON:  Stefani Dama, M.D.   ANESTHESIA:  General endotracheal.   INDICATIONS:  [name] is a 76 year old individual who has had  significant neck, shoulder and right arm pain and weakness in the intrinsic  muscles of the hand.  He has demonstrated a herniated nucleus pulposus at  the T1-T2 level, and he described symptoms and signs consistent with a T1  radiculopathy.  He has been advised regarding surgical extirpation of the  disk at this level; he is now taken to the operating room.   PROCEDURE:  The patient was brought to the operating room supine on the  stretcher, after having central venous catheter placed.  He was placed under  general endotracheal anesthesia and then placed into the sitting position  with a three-pin headrest being applied to the head  After affixing the head  in the sitting position, the back of the neck was shaved, prepped with  alcohol and then DuraPrep, and then draped in sterile fashion.  Fluoroscopic  guidance was used to localize the superior portion of the T1 vertebra; T2  could not be visualized because of the expanse of the shoulders and the  chest cavity.  The area above this area was infiltrated with a total of 20  cc lidocaine with epinephrine 1:100,000.  A vertical  right paramedian  incision was made over the T1 area.  A K-wire was passed down to the  inferior aspect of T1; and, using a wanding technique, a series of dilators  were passed over this area.  Ultimately an 18 mm x 7 cm deep endoscopic  cannula was affixed to the operating table with a clamp.  The operating  microscope was draped and brought into the field, and then the soft tissues  at the bottom of the endoscopic cannula were cleared away to expose, what  appeared to be, the laminar arch of T1.  This was exposed to the inferior  aspect, and the inferior aspect was cleared.  The lamina facetectomy was  created use removing large portion of the bone with a high-speed drill with  a 2.3 mm dissecting tool.  As the medial aspect of the bone was removed, the  dural edge was identified, and further dissection revealed the area of the  foramen.  The dorsal surface of the nerve root was then cleared off with a  1.5 and a 2 mm Kerrison punch.  Radical  facetectomy was created in this  region.  The dorsal surface and the superior aspect of the nerve root being  identified, the inferior aspect was then identified.  Some tissue in this  area was cauterized and epidural veins were divided after careful  cauterization.  The nerve root was then identified and elevated slightly,  and the area inferior to the nerve root was inspected carefully.  On the  medial aspect, just underneath the dura, a glistening irregular sepsis was  identified.  This was consistent with a fragment of disk.  With some gentle  teasing, the edge could be brought into better view; and then, using a micro  pituitary rongeur, the fragment was removed.  This allowed for some  relaxation of the T1 nerve root and the exit foramen.  Further exploration  yielded no other fragments.   With this, the decompression was felt to be quite good.  The area was  irrigated copiously with antibiotic irrigating solution.  Hemostasis from  some  epidural veins in this area was obtained meticulously with some bipolar  cautery and careful use of Gelfoam-soaked thrombin pledgets, which were  later irrigated away.  When hemostasis was well established, the endoscopic  cannula was removed.  Hemostasis in the soft tissues was again obtained by  packing with a 4x4 sponge; and then the fascia was closed with 3-0 Vicryl,  and the subcuticular tissue was similarly closed with 3-0 Vicryl.  Dermabond  was applied to the skin.  The patient was then removed from the head frame  and returned to recovery room in stable condition.  Blood loss for the  procedure was less than 50 cc total.      Stefani Dama, M.D.  Electronically Signed     HJE/MEDQ  D:  06/05/2006  T:  06/07/2006  Job:  [id number]

## 2011-01-20 NOTE — H&P (Signed)
NAMEREILY, ILIC              ACCOUNT NO.:  [id number]   MEDICAL RECORD NO.:  [id number]          PATIENT TYPE:  INP   LOCATION:  3013                         FACILITY:  [facility]   PHYSICIAN:  Stefani Dama, M.D.  DATE OF BIRTH:  07-Jan-1935   DATE OF ADMISSION:  07/30/2006  DATE OF DISCHARGE:                              HISTORY & PHYSICAL   ADMISSION DIAGNOSIS:  Cervical and thoracic spondylosis, right T1  radiculopathy, herniated nucleus pulposus T1-T2.   INDICATIONS:  [name] is a 76 year old individual who had a  herniated nucleus pulposus at the T1-T2 level on the right.  He has been  doing well and was discharged from my care several weeks ago.  The  patient presented to the emergency room today rather acutely complaining  of severe pain in his right arm that had been going on for the past 3  days time.  An MRI demonstrates the presence of a large recurrence of  the disk herniation at the T1-T2 level on the right side   The patient underwent posterior cervical laminotomy and diskectomy and  had prompt relief of his symptoms.  He had been quite pleased and was  returning to more normal activities.  He had been discharged from my  care.  The patient notes that he has the same degree and severity of the  pain.  He knows that there is weakness in the hand, and previously we  had noted that he had developed a fairly acute weakness in the  intrinsics in the hands.  He complained of pain, particularly down the  ulnar distribution of his arm and forearm.   PAST MEDICAL HISTORY:  The patient's general health has been good.  He  is retired.  He has been physically active.  He reports no significant  medical problems and takes no medications on a chronic basis.  He does  note that he gets an occasional irregular heart beat and complains of  some gastric reflux.   PHYSICAL EXAMINATION:  GENERAL:  He is an alert, oriented and  cooperative individual in no overt distress.   His range of motion in his  neck allows him to turn 60 degrees to the left and to the right.  He  extends and flexes normally.  Axial compression does not reproduce any  pain.  His motor strength in the upper extremities reveals the deltoids,  biceps and triceps of good strength to confrontational testing.  Intrinsics of the hands, particularly from the hypothenar muscles,  reveal that he has great difficulty extending his fingers straight.  He  also has great difficulty abducting his small digit.  His grip strength  is decreased on the right side to 3/5.  His sensation appears diminished  on the ulnar aspect of the forearm and hand, particularly along the  lateral digit on the right.  No masses are palpable in the neck.  No  bruits are noted.  EXTREMITIES:  Lower extremity strength appears normal with reflexes  being preserved at 2+ in the patellae, 1+ in the Achilles, 2+ in the  biceps and triceps, 1+  in the brachioradialis.  HEENT:  Normal.  NECK:  No masses, no bruits are heard.  LUNGS:  Clear to auscultation.  HEART:  Regular rate and rhythm.  No murmurs is heard.  ABDOMEN:  Soft.  Bowel sounds are positive.  No masses are palpable.  EXTREMITIES:  Reveal no cyanosis, clubbing or edema.   IMPRESSION:  The patient has evidence of recurrent herniated nucleus  pulposus at T1-T2.  He is now to undergo surgical extirpation of the  disk at T1-T2.      Stefani Dama, M.D.  Electronically Signed     HJE/MEDQ  D:  07/30/2006  T:  07/31/2006  Job:  [id number]

## 2011-01-20 NOTE — Op Note (Signed)
NAME:  [name], [name]              ACCOUNT NO.:  [id number]   MEDICAL RECORD NO.:  [id number]          PATIENT TYPE:  AMB   LOCATION:  ENDO                         FACILITY:  [facility]   PHYSICIAN:  James L. Malon Kindle., M.D.DATE OF BIRTH:  1935-02-10   DATE OF PROCEDURE:  11/01/2004  DATE OF DISCHARGE:                                 OPERATIVE REPORT   PROCEDURE:  Colonoscopy.   MEDICATIONS:  Fentanyl 100 mcg, Versed 9 mg IV.   SCOPE:  Olympus pediatric adjustable colonoscope.   INDICATIONS FOR PROCEDURE:  History of previous adenomatous colon polyps.  This is done as a followup.   DESCRIPTION OF PROCEDURE:  The procedure had been explained to the patient  and consent obtained.  With the patient in the left lateral decubitus  position, the Olympus scope was inserted and advanced. The cecum and  ileocecal valve were reached after placing the patient on his back and using  some abdominal pressure. The scope was withdrawn and the cecum, ascending  colon, transverse colon, splenic flexure, descending and sigmoid colon were  seen well. There were no polyps seen. No significant diverticular disease.  The scope was withdrawn. The patient tolerated the procedure well.   ASSESSMENT:  Previous history of adenomatous polyps, negative colonoscopy at  this time, V12.72.   PLAN:  Will recommend yearly Hemoccults and repeat colonoscopy in five  years.      JLE/MEDQ  D:  11/01/2004  T:  11/01/2004  Job:  [id number]

## 2011-01-20 NOTE — Op Note (Signed)
NAMEDEVONTAE, [name]              ACCOUNT NO.:  [id number]   MEDICAL RECORD NO.:  [id number]          PATIENT TYPE:  INP   LOCATION:  2038                         FACILITY:  [facility]   PHYSICIAN:  Petra Kuba, M.D.    DATE OF BIRTH:  1935/05/16   DATE OF PROCEDURE:  09/27/2005  DATE OF DISCHARGE:                                 OPERATIVE REPORT   PROCEDURE PERFORMED:  Esophagogastroduodenoscopy with biopsy.   ENDOSCOPIST:  Petra Kuba, M.D.   INDICATIONS FOR PROCEDURE:  Atypical chest pain.  Consent was signed after  the risks, benefits, methods and options were thoroughly discussed in the  office and in the hospital yesterday.   MEDICINES USED:  Fentanyl 25 mcg, Versed 2 mg.   DESCRIPTION OF PROCEDURE:  The video endoscope was inserted by direct  vision.  The esophagus was normal.  I doubt he had a hiatal hernia.  Scope  passed into the stomach where he did have a moderate amount of gastritis and  antritis.  The scope was passed through a normal pylorus into a normal  duodenal bulb and around the C-loop to a normal second portion of the  duodenum.  The scope was withdrawn back to the bulb and a good look there  ruled out abnormalities in all locations.  Scope was withdrawn back to the  stomach and retroflexed.  The angularis, cardia, fundus, lesser and greater  curve were all normal on retroflex visualization except for the moderate  amount of gastritis.  Straight visualization of the stomach confirmed the  above findings.  I went ahead and took a few biopsies of antrum, a few of  the proximal stomach to confirm the gastritis, rule out Helicobacter pylori.  Air was suctioned, scope was slowly withdrawn.  Again, a good look at  esophagus was normal.  Scope was removed.  The patient tolerated the  procedure well.  There were no obvious immediate complication.   ENDOSCOPIC DIAGNOSIS:  1.  Moderate antritis, gastritis, status post biopsy.  2.  Otherwise normal  esophagogastroduodenoscopy.   PLAN:  Await pathology.  Continue pump inhibitors.  Happy to see back p.r.n.  or Dr. Randa Evens as an outpatient.  The patient's daughter actually had a CCK-  PEPIDA which helped her with her pain but this just does not sound like the  gallbladder, but could proceed with that next if you think this is a GI  problem.           ______________________________  Petra Kuba, M.D.     MEM/MEDQ  D:  09/27/2005  T:  09/27/2005  Job:  [id number]   cc:   Cassell Clement, M.D.  Fax: [phone]   Llana Aliment. Malon Kindle., M.D.  Fax: [phone]

## 2011-01-20 NOTE — Discharge Summary (Signed)
[name], [name]              ACCOUNT NO.:  [id number]   MEDICAL RECORD NO.:  [id number]          PATIENT TYPE:  INP   LOCATION:  2009                         FACILITY:  [facility]   PHYSICIAN:  Cassell Clement, M.D. DATE OF BIRTH:  Nov 14, 1934   DATE OF ADMISSION:  03/25/2009  DATE OF DISCHARGE:  04/03/2009                               DISCHARGE SUMMARY   FINAL DIAGNOSES:  1. Chest pain, uncertain etiology, myocardial infarction ruled out.  2. Esophageal reflux.  3. Essential hypertension.  4. Osteoarthritis.  5. Possible obstructive sleep apnea.  6. Unspecified gastritis and gastroduodenitis.  7. Diaphragmatic hernia.  8. Chronic cholecystitis suggested by HIDA scan, although gallbladder      ultrasound negative.   OPERATIONS PERFORMED:  Left heart cardiac catheterization on March 26, 2009, and upper endoscopy on March 29, 2009.   HISTORY:  This 76 year old gentleman was admitted with severe chest  pain.  He has a past history of similar presentation several years  earlier when he had a normal cardiac catheterization.  This time, he  awoke at around midnight with right-sided chest pain, which he rated a  12/10, which radiated across his chest.  There was no nausea or  vomiting.  There was no response to antacid or nitroglycerin.  His D-  dimer was elevated.  A CT angio of the chest did not show any dissection  or pulmonary emboli.  However, there was heavy coronary artery  calcification, which has also been seen on the previous admission.  His  initial cardiac enzymes were negative.  His electrocardiogram even  during pain showed no acute changes.  The pain was so severe that it was  requiring morphine for relief.   The physical exam on admission showed a blood pressure of 166/77, pulse  is 60.  The lungs are clear.  The heart reveals no murmur, gallop, or  rub.  Abdomen is negative.  Extremities are negative.   The patient was started on IV heparin because of the elevated  D-dimer.  He was also treated with IV nitroglycerin, which caused severe headache.  Serial cardiac enzymes were negative and repeat EKG showed no changes.  Because of the intensity of his pain, it was felt that he would need  cardiac catheterization to be sure there was not something cardiac going  on.  Dr. Kristeen Miss saw the patient and took him to the Cath Lab on  March 26, 2009.  He found essentially normally coronary arteries with  only minor irregularities, and he found normal LV function with an  ejection fraction of 55%.  There was coronary artery calcification.  Because of positive family history of gallstones and lack of suitable  explanation for his pain, we proceeded with a GI workup.  The patient  had a gallbladder ultrasound, which was negative for cholelithiasis.  The patient continued to experience severe pain and for this reason, we  asked GI to see the patient.  Dr. Evette Cristal saw him for pre-endoscopy  consult and noted that he had had a similar workup 2 years earlier at  which time, he was found  to have antritis and gastritis in 2007.  He  also noted that his recent hepatic function panel and lipase had been  normal on this admission.  His physical exam continued to be  unremarkable despite the patient's complaint of severe substernal pain.  Of note was the fact that the pain was substernal and was not in the  right upper quadrant.  The patient underwent upper endoscopy, which  showed mild gastritis, otherwise negative.  Dr. Randa Evens who did the  endoscopy doubted that was a cause of his chest pain.  Post endoscopy  again the patient's chest pain increased from 2/10 to 8/10 and STAT EKG  and enzymes did not show any evidence of myocardial damage.  We  proceeded to get a HIDA scan for gallbladder function.  The hiatus scan  was read out as being consistent with chronic cholecystitis.  It was  noted, however, that the gallbladder ultrasound itself had been  negative.  It was  felt that the location of the patient's pain was very  atypical for a gallbladder etiology.  Nonetheless with the abnormal  reading on the HIDA scan, we did ask Dr. Lindie Spruce of the [facility] to see the patient.  Dr. Lindie Spruce did not think that the  gallbladder was the cause of his pain, and he did not recommend surgery  therefore.  With that we tried additional GI medications including  Carafate slurry and Reglan.  We also added over-the-counter Gas-X.  The  patient was followed for the next several days by Dr. Lindie Spruce who also  discussed his case with several of his partners, all of whom felt that  the patient's symptoms were unlikely to be due to gallbladder disease.  Pain management continued to be a problem, but by April 03, 2009, the  patient felt tremendously better and was anxious to go home.  His vital  signs were stable at that point and the patient was able to be  discharged home improved on April 03, 2009.   DISCHARGE MEDICATIONS:  1. Aspirin 325 one daily.  2. Protonix 40 mg one daily.  3. Carafate 1 g three times a day before meals.  4. Reglan 10 mg 1 tablet twice a day before breakfast and dinner.  5. Toprol-XL 50 mg one daily.   The patient will return to see Dr. Patty Sermons in 2 weeks and he will see  Dr. Randa Evens in 2-3 weeks.  He will be on a low-sodium heart-healthy  diet.   Condition at discharge is improved.           ______________________________  Cassell Clement, M.D.     TB/MEDQ  D:  05/02/2009  T:  05/03/2009  Job:  [id number]   cc:   Cherylynn Ridges, M.D.  James L. Malon Kindle., M.D.  Vesta Mixer, M.D.

## 2011-01-20 NOTE — Op Note (Signed)
[facility]  Patient:    [name], [name] Visit Number: [id number] MRN: [id number]          Service Type: END Location: ENDO Attending Physician:  Orland Mustard Dictated by:   Llana Aliment. Randa Evens, M.D. Proc. Date: 08/22/01 Admit Date:  08/22/2001 Discharge Date: 08/22/2001   CC:         Desma Maxim, M.D.   Operative Report  DATE OF BIRTH:  07-04-1935  PROCEDURE PERFORMED:  Colonoscopy and coagulation of polyp.  ENDOSCOPIST:  Llana Aliment. Randa Evens, M.D.  MEDICATIONS USED:  Fentanyl 50 mcg, Versed 6 mg IV.  INSTRUMENT:  Adult Olympus video colonoscope.  INDICATIONS:  Previous history of adenomatous polyps.  This is done as routine follow-up.  DESCRIPTION OF PROCEDURE:  The procedure had been explained to the patient and consent obtained.  With the patient in the left lateral decubitus position, the Olympus adult video colonoscope was inserted and advanced under direct visualization.  The prep was excellent and we were able to advance to the cecum with the patient in the right lateral decubitus position using abdominal pressure.  The scope was withdrawn.  The cecum, ascending colon were seen well.  A 4 mm polyp in the ascending colon was cauterized.  The hepatic flexure, transverse colon, splenic flexure, descending and sigmoid colon were seen well and no polyps, no significant diverticular disease.  Scope withdrawn, patient tolerated the procedure well.  ASSESSMENT:  Ascending colon polyp cauterized.  PLAN:  Routine postpolypectomy instructions, recommend repeat procedure in three years. Dictated by:   Llana Aliment. Randa Evens, M.D. Attending Physician:  Orland Mustard DD:  08/22/01 TD:  08/23/01 Job: [id number]

## 2011-02-01 ENCOUNTER — Encounter (HOSPITAL_COMMUNITY)
Admission: RE | Admit: 2011-02-01 | Discharge: 2011-02-01 | Disposition: A | Discharge status: Home / Self Care | Payer: Medicare Other | Source: Ambulatory Visit | Attending: Orthopaedic Surgery | Admitting: Orthopaedic Surgery

## 2011-02-01 ENCOUNTER — Telehealth: Payer: Self-pay | Admitting: Cardiology

## 2011-02-01 LAB — COMPREHENSIVE METABOLIC PANEL
Albumin: 4 g/dL (ref 3.5–5.2)
Alkaline Phosphatase: 60 U/L (ref 39–117)
BUN: 21 mg/dL (ref 6–23)
Calcium: 9.6 mg/dL (ref 8.4–10.5)
Glucose, Bld: 95 mg/dL (ref 70–99)
Potassium: 4.7 mEq/L (ref 3.5–5.1)
Sodium: 144 mEq/L (ref 135–145)
Total Protein: 6.9 g/dL (ref 6.0–8.3)

## 2011-02-01 LAB — CBC
HCT: 45.8 % (ref 39.0–52.0)
MCV: 89.8 fL (ref 78.0–100.0)
RBC: 5.1 MIL/uL (ref 4.22–5.81)
RDW: 13.4 % (ref 11.5–15.5)
WBC: 7.6 10*3/uL (ref 4.0–10.5)

## 2011-02-01 LAB — URINALYSIS, ROUTINE W REFLEX MICROSCOPIC
Bilirubin Urine: NEGATIVE
Hgb urine dipstick: NEGATIVE
Nitrite: NEGATIVE
Specific Gravity, Urine: 1.023 (ref 1.005–1.030)
Urobilinogen, UA: 1 mg/dL (ref 0.0–1.0)
pH: 6 (ref 5.0–8.0)

## 2011-02-01 LAB — DIFFERENTIAL
Basophils Absolute: 0 10*3/uL (ref 0.0–0.1)
Lymphocytes Relative: 31 % (ref 12–46)
Lymphs Abs: 2.4 10*3/uL (ref 0.7–4.0)
Neutro Abs: 4.3 10*3/uL (ref 1.7–7.7)
Neutrophils Relative %: 56 % (ref 43–77)

## 2011-02-01 LAB — PROTIME-INR
INR: 1.03 (ref 0.00–1.49)
Prothrombin Time: 13.7 seconds (ref 11.6–15.2)

## 2011-02-01 LAB — APTT: aPTT: 26 seconds (ref 24–37)

## 2011-02-01 LAB — SURGICAL PCR SCREEN
MRSA, PCR: NEGATIVE
Staphylococcus aureus: NEGATIVE

## 2011-02-01 NOTE — Telephone Encounter (Signed)
JENNIE WITH [facility] AND LAST OFFICE NOTE FAX [phone]

## 2011-02-02 LAB — URINE CULTURE
Colony Count: NO GROWTH
Culture  Setup Time: 201205302035

## 2011-02-07 ENCOUNTER — Inpatient Hospital Stay (HOSPITAL_COMMUNITY)
Admission: RE | Admit: 2011-02-07 | Discharge: 2011-02-09 | DRG: 467 | Disposition: A | Discharge status: Home Health | Payer: Medicare Other | Source: Ambulatory Visit | Attending: Orthopaedic Surgery | Admitting: Orthopaedic Surgery

## 2011-02-07 ENCOUNTER — Inpatient Hospital Stay (HOSPITAL_COMMUNITY): Payer: Medicare Other

## 2011-02-07 DIAGNOSIS — T8489XA Other specified complication of internal orthopedic prosthetic devices, implants and grafts, initial encounter: Principal | ICD-10-CM | POA: Diagnosis present

## 2011-02-07 DIAGNOSIS — M549 Dorsalgia, unspecified: Secondary | ICD-10-CM | POA: Diagnosis present

## 2011-02-07 DIAGNOSIS — D62 Acute posthemorrhagic anemia: Secondary | ICD-10-CM | POA: Diagnosis not present

## 2011-02-07 DIAGNOSIS — Y849 Medical procedure, unspecified as the cause of abnormal reaction of the patient, or of later complication, without mention of misadventure at the time of the procedure: Secondary | ICD-10-CM | POA: Diagnosis present

## 2011-02-07 DIAGNOSIS — M25559 Pain in unspecified hip: Secondary | ICD-10-CM | POA: Diagnosis present

## 2011-02-07 DIAGNOSIS — Z96649 Presence of unspecified artificial hip joint: Secondary | ICD-10-CM

## 2011-02-07 DIAGNOSIS — I10 Essential (primary) hypertension: Secondary | ICD-10-CM | POA: Diagnosis present

## 2011-02-07 DIAGNOSIS — E876 Hypokalemia: Secondary | ICD-10-CM | POA: Diagnosis not present

## 2011-02-07 LAB — ABO/RH: ABO/RH(D): O NEG

## 2011-02-08 LAB — CBC
HCT: 37.5 % — ABNORMAL LOW (ref 39.0–52.0)
Hemoglobin: 13.1 g/dL (ref 13.0–17.0)
MCHC: 34.9 g/dL (ref 30.0–36.0)
RBC: 4.16 MIL/uL — ABNORMAL LOW (ref 4.22–5.81)
WBC: 8.5 10*3/uL (ref 4.0–10.5)

## 2011-02-08 NOTE — Op Note (Signed)
NAMERIAD, WAGLEY NO.:  [id number]  MEDICAL RECORD NO.:  [id number]  LOCATION:  5010                         FACILITY:  [facility]  PHYSICIAN:  [name], M.D.DATE OF BIRTH:  04-Aug-1935  DATE OF PROCEDURE:  02/07/2011 DATE OF DISCHARGE:                              OPERATIVE REPORT   PREOPERATIVE DIAGNOSIS:  Painful eccentric wear of polyethylene component, right total hip replacement.  POSTOPERATIVE DIAGNOSIS:  Painful eccentric wear of polyethylene component, right total hip replacement.  PROCEDURE:  Revision and bridging polyethylene and femoral head components of right total hip replacement.  SURGEON:  George Manges. Cleophas Dunker, MD  ASSISTANT:  Oris Drone. Petrarca, PA-C  ANESTHESIA:  General.  COMPONENTS REMOVED:  A Duraloc polyethylene acetabular component with a Duraloc locking ring and a 28-mm outer diameter femoral head and inserted a Duraloc locking ring 60-mm polyethylene bearing with a 10- degree posterior lip, a 36-mm outer diameter femoral head.  PROCEDURE:  [name] was met in the holding area, identified the right hip as the appropriate operative site and marked the right hip. The patient was then transported to room #15 and placed under general orotracheal anesthesia without difficulty.  Nursing staff inserted a Foley catheter.  Urine was clear.  The patient was then carefully placed in the lateral decubitus position with the right side up and secured to the operating room table with the Innomed hip system.  The left hip was then prepped with Betadine scrub and DuraPrep from the iliac crest to below the knee.  Sterile draping was performed.  The previous southern incision was utilized and via sharp dissection carried down to subcutaneous tissue.  Any gross bleeders were Bovie coagulated. Adipose tissue was incised to the level of the iliotibial band which was incised with the Bovie.  Self-retaining retractors were  inserted proximally and distally.  There was considerable scar beneath the iliotibial band.  I initially had some difficulty finding the appropriate operative plane but with careful dissection, I found the old short external rotators and the capsule.  The short external rotators were carefully tagged.  The capsule was then incised with the Bovie along the femoral neck and head. There was a somewhat cloudy yellow joint fluid.  This was sent for culture and sensitivity.  I then opened the capsule, there was considerable inflammatory synovitis and foreign body reaction synovitis. I sent specimens to the lab.  I did a synovectomy.  The head was then dislocated from the acetabulum and then the head was removed with a punch.  There was considerable scarring around the capsule.  I had some difficulty placing the femoral neck anteriorly.  I did a capsular release to facilitate this.  The retractors were then inserted.  Using the polyethylene component extractor, this was inserted and the Duraloc component was then removed.  The  locking ring was also identified and removed.  Unfortunately, the revision components were not available from [facility] at this time and we had to have one of the representatives from [facility] visit another hospital to pick up the components.  During that time, the wound was irrigated with saline solution and antibiotic solution.  I redraped and then waded carefully without  any further problems with the anesthesia.  Once we had the components in hand, the wound was then re-irrigated. Retractor was then placed about the acetabulum.  There was still some soft tissue surrounding the acetabulum which was debrided.  I had a clear view of the acetabulum.  The locking ring was then inserted and the 60-mm outer diameter Duraloc Marathon acetabular liner with a +4 offset and 10 degree posterior lip was inserted and impacted.  I then checked it carefully with a Glorious Peach and felt that it  was nicely locked in place.  Wound was irrigated with saline solution.  I cleaned the Morse taper neck, inserted a +1.5 mm neck length 36-mm outer diameter hip ball corresponding to the same neck length that was removed.  This was then reduced and through full range of motion I had perfect stability of the components.  Preoperatively, there did not appear to be any cyst formation about the pelvis of the proximal femur and there was excellent position of the acetabulum.  I did check the acetabulum and thought it was perfectly stable.  The head was then reduced, it was then dislocated, the trial head was removed and the final 36-mm outer diameter metallic hip ball was applied to the [facility] taper neck with a 1.5 mm neck length.  Again, I irrigated the acetabulum and then I carefully reduced the head.  I felt like the leg lengths were symmetrical and I had perfect stability.  Wound was again irrigated with saline solution.  The capsule was closed with interrupted #1 Ethibond.  The short external rotators and the scar were then closed with the same material.  Iliotibial band was closed with several Ethibond and a running #1 Vicryl, the subcu was closed in several layers with 0 and 2-0 Vicryl, 3-0 Monocryl and skin clips. Mepilex dressing was applied.  There were no anesthetic complications.  The patient received 2 grams of Ancef IV preoperatively.     [name], M.D.     PWW/MEDQ  D:  02/07/2011  T:  02/08/2011  Job:  [id number]  Electronically Signed by Norlene Campbell M.D. on 02/08/2011 08:48:52 AM

## 2011-02-09 LAB — CROSSMATCH: Unit division: 0

## 2011-02-09 LAB — CBC
MCH: 31.4 pg (ref 26.0–34.0)
MCHC: 34.8 g/dL (ref 30.0–36.0)
Platelets: 126 10*3/uL — ABNORMAL LOW (ref 150–400)
RBC: 3.92 MIL/uL — ABNORMAL LOW (ref 4.22–5.81)

## 2011-02-09 LAB — BASIC METABOLIC PANEL
Calcium: 8.2 mg/dL — ABNORMAL LOW (ref 8.4–10.5)
Creatinine, Ser: 0.61 mg/dL (ref 0.4–1.5)
GFR calc Af Amer: >60 mL/min (ref >60)

## 2011-02-09 LAB — WOUND CULTURE

## 2011-02-10 ENCOUNTER — Emergency Department (HOSPITAL_COMMUNITY)
Admission: EM | Admit: 2011-02-10 | Discharge: 2011-02-11 | Disposition: A | Discharge status: Home / Self Care | Payer: Medicare Other | Attending: Emergency Medicine | Admitting: Emergency Medicine

## 2011-02-10 DIAGNOSIS — X58XXXA Exposure to other specified factors, initial encounter: Secondary | ICD-10-CM | POA: Insufficient documentation

## 2011-02-10 DIAGNOSIS — I10 Essential (primary) hypertension: Secondary | ICD-10-CM | POA: Insufficient documentation

## 2011-02-10 DIAGNOSIS — Z9889 Other specified postprocedural states: Secondary | ICD-10-CM | POA: Insufficient documentation

## 2011-02-10 DIAGNOSIS — M25569 Pain in unspecified knee: Secondary | ICD-10-CM | POA: Insufficient documentation

## 2011-02-10 DIAGNOSIS — S7000XA Contusion of unspecified hip, initial encounter: Secondary | ICD-10-CM | POA: Insufficient documentation

## 2011-02-10 DIAGNOSIS — M25559 Pain in unspecified hip: Secondary | ICD-10-CM | POA: Insufficient documentation

## 2011-02-10 LAB — TISSUE CULTURE: Culture: NO GROWTH

## 2011-02-11 LAB — POCT I-STAT, CHEM 8
HCT: 41 % (ref 39.0–52.0)
Hemoglobin: 13.9 g/dL (ref 13.0–17.0)
Potassium: 3.7 mEq/L (ref 3.5–5.1)
Sodium: 140 mEq/L (ref 135–145)

## 2011-02-11 LAB — CBC
HCT: 38.1 % — ABNORMAL LOW (ref 39.0–52.0)
Hemoglobin: 13.7 g/dL (ref 13.0–17.0)
RDW: 13.3 % (ref 11.5–15.5)
WBC: 9.6 10*3/uL (ref 4.0–10.5)

## 2011-02-11 LAB — APTT: aPTT: 34 seconds (ref 24–37)

## 2011-02-11 LAB — PROTIME-INR: INR: 1.33 (ref 0.00–1.49)

## 2011-02-11 LAB — BODY FLUID CULTURE: Culture: NO GROWTH

## 2011-02-11 LAB — DIFFERENTIAL
Basophils Absolute: 0 10*3/uL (ref 0.0–0.1)
Lymphocytes Relative: 20 % (ref 12–46)
Neutro Abs: 6.2 10*3/uL (ref 1.7–7.7)
Neutrophils Relative %: 65 % (ref 43–77)

## 2011-02-12 LAB — ANAEROBIC CULTURE: Gram Stain: NONE SEEN

## 2011-03-05 DIAGNOSIS — J069 Acute upper respiratory infection, unspecified: Secondary | ICD-10-CM

## 2011-03-05 HISTORY — DX: Acute upper respiratory infection, unspecified: J06.9

## 2011-04-19 ENCOUNTER — Ambulatory Visit (INDEPENDENT_AMBULATORY_CARE_PROVIDER_SITE_OTHER): Payer: Medicare Other | Admitting: *Deleted

## 2011-04-19 DIAGNOSIS — N400 Enlarged prostate without lower urinary tract symptoms: Secondary | ICD-10-CM

## 2011-04-19 DIAGNOSIS — M1990 Unspecified osteoarthritis, unspecified site: Secondary | ICD-10-CM

## 2011-04-19 DIAGNOSIS — I119 Hypertensive heart disease without heart failure: Secondary | ICD-10-CM

## 2011-04-19 LAB — HEPATIC FUNCTION PANEL
AST: 25 U/L (ref 0–37)
Alkaline Phosphatase: 48 U/L (ref 39–117)
Bilirubin, Direct: 0.2 mg/dL (ref 0.0–0.3)
Total Bilirubin: 1.2 mg/dL (ref 0.3–1.2)

## 2011-04-19 LAB — BASIC METABOLIC PANEL
Calcium: 9.2 mg/dL (ref 8.4–10.5)
GFR: 101.94 mL/min (ref >60.00)
Glucose, Bld: 112 mg/dL — ABNORMAL HIGH (ref 70–99)
Potassium: 4.1 mEq/L (ref 3.5–5.1)
Sodium: 143 mEq/L (ref 135–145)

## 2011-04-19 LAB — LIPID PANEL
LDL Cholesterol: 105 mg/dL — ABNORMAL HIGH (ref 0–99)
Total CHOL/HDL Ratio: 4
VLDL: 21 mg/dL (ref 0.0–40.0)

## 2011-04-21 ENCOUNTER — Ambulatory Visit (INDEPENDENT_AMBULATORY_CARE_PROVIDER_SITE_OTHER): Payer: Medicare Other | Admitting: Cardiology

## 2011-04-21 ENCOUNTER — Encounter: Payer: Self-pay | Admitting: Cardiology

## 2011-04-21 VITALS — BP 126/78 | HR 68 | Wt 234.0 lb

## 2011-04-21 DIAGNOSIS — E7800 Pure hypercholesterolemia, unspecified: Secondary | ICD-10-CM

## 2011-04-21 DIAGNOSIS — R1013 Epigastric pain: Secondary | ICD-10-CM

## 2011-04-21 DIAGNOSIS — E785 Hyperlipidemia, unspecified: Secondary | ICD-10-CM

## 2011-04-21 DIAGNOSIS — Z9049 Acquired absence of other specified parts of digestive tract: Secondary | ICD-10-CM

## 2011-04-21 DIAGNOSIS — K3189 Other diseases of stomach and duodenum: Secondary | ICD-10-CM

## 2011-04-21 DIAGNOSIS — I119 Hypertensive heart disease without heart failure: Secondary | ICD-10-CM

## 2011-04-21 DIAGNOSIS — Z9089 Acquired absence of other organs: Secondary | ICD-10-CM

## 2011-04-21 NOTE — Assessment & Plan Note (Signed)
The patient is not having any symptoms of common duct stones following his cholecystectomy.  No jaundice or right upper quadrant discomfort.

## 2011-04-21 NOTE — Progress Notes (Signed)
Lenard Forth Date of Birth:  September 08, 1934 [facility] / [facility] [address]   [address] [place], [place]  [address] [phone]           Fax   [phone]  History of Present Illness: This pleasant 76 year old gentleman is seen for a scheduled followup office visit.  He has a past history of atypical chest pain.  He has had several cardiac catheterizations.  These have not shown significant obstructive coronary disease.  Recent catheterization was in July 2010.  The patient subsequently developed symptoms of gastritis and duodenitis and was also found to have chronic cholecystitis and underwent laparoscopic cholecystectomy by Dr. Zachery Dakins on 09/20/09.  Postoperatively he required ERCP by Dr. Ewing Schlein for removal of some common duct stones.  The patient has had very little chest discomfort since then.  He's to has occasional acute dyspepsia.  Since we last saw him the patient has had successful right hip replacement by Dr. Cleophas Dunker.  Current Outpatient Prescriptions  Medication Sig Dispense Refill  . Ascorbic Acid (VITAMIN C) 500 MG tablet Take 1 tablet (500 mg total) by mouth daily.  30 tablet    . aspirin 325 MG tablet Take 325 mg by mouth daily.        Marland Kitchen ibuprofen (ADVIL,MOTRIN) 200 MG tablet Take 200 mg by mouth every 6 (six) hours as needed.        . metoprolol tartrate (LOPRESSOR) 25 MG tablet Take 25 mg by mouth daily.        . nitroGLYCERIN (NITROSTAT) 0.4 MG SL tablet Place 0.4 mg under the tongue every 5 (five) minutes as needed.        . potassium chloride SA (K-DUR,KLOR-CON) 20 MEQ tablet Take 20 mEq by mouth daily.         Allergies  Allergen Reactions  . Sulfa Antibiotics   . Tape     Adhesive tape  . Tetracyclines & Related     Patient Active Problem List  Diagnoses  . Benign hypertensive heart disease without heart failure  . BPH (benign prostatic hyperplasia)  . Osteoarthritis  . Dyslipidemia  . Status post cholecystectomy    History    Smoking status  . Never Smoker   Smokeless tobacco  . Not on file    History  Alcohol Use No    Family History  Problem Relation Age of Onset  . Heart disease Mother   . Heart attack Mother     Review of Systems: Constitutional: no fever chills diaphoresis or fatigue or change in weight.  Head and neck: no hearing loss, no epistaxis, no photophobia or visual disturbance. Respiratory: No cough, shortness of breath or wheezing. Cardiovascular: No chest pain peripheral edema, palpitations. Gastrointestinal: No abdominal distention, no abdominal pain, no change in bowel habits hematochezia or melena. Genitourinary: No dysuria, no frequency, no urgency, no nocturia. Musculoskeletal:No arthralgias, no back pain, no gait disturbance or myalgias. Neurological: No dizziness, no headaches, no numbness, no seizures, no syncope, no weakness, no tremors. Hematologic: No lymphadenopathy, no easy bruising. Psychiatric: No confusion, no hallucinations, no sleep disturbance.    Physical Exam: Filed Vitals:   04/21/11 1523  BP: 126/78  Pulse: 68  General appearance reveals a well-developed tall gentleman in no distress.The head and neck exam reveals pupils equal and reactive.  Extraocular movements are full.  There is no scleral icterus.  The mouth and pharynx are normal.  The neck is supple.  The carotids reveal no bruits.  The jugular  venous pressure is normal.  The  thyroid is not enlarged.  There is no lymphadenopathy.  The chest is clear to percussion and auscultation.  There are no rales or rhonchi.  Expansion of the chest is symmetrical.  The precordium is quiet.  The first heart sound is normal.  The second heart sound is physiologically split.  There is no murmur gallop rub or click.  There is no abnormal lift or heave.  The abdomen is soft and nontender.  The bowel sounds are normal.  The liver and spleen are not enlarged.  There are no abdominal masses.  There are no abdominal bruits.   Extremities reveal good pedal pulses.  There is no phlebitis or edema.  There is no cyanosis or clubbing.  Strength is normal and symmetrical in all extremities.  There is no lateralizing weakness.  There are no sensory deficits.  The skin is warm and dry.  There is no rash.     Assessment / Plan: Continue same medication.  Recheck in 4 months for followup office visit and fasting lab work.

## 2011-04-21 NOTE — Assessment & Plan Note (Signed)
Patient has a history of hypercholesterolemia.  He is on a low cholesterol diet.  He does not have to take any statin therapy.

## 2011-04-21 NOTE — Assessment & Plan Note (Signed)
The patient has not been having in his symptoms referable to his blood pressure.  He denies dizziness or syncope or headaches.

## 2011-06-01 ENCOUNTER — Other Ambulatory Visit: Payer: Self-pay | Admitting: Cardiology

## 2011-06-01 NOTE — Telephone Encounter (Signed)
Refilled k-dur

## 2011-06-06 LAB — URINALYSIS, ROUTINE W REFLEX MICROSCOPIC
Bilirubin Urine: NEGATIVE
Glucose, UA: NEGATIVE
Ketones, ur: 15 — AB
Nitrite: NEGATIVE
Protein, ur: 100 — AB
pH: 7.5

## 2011-06-06 LAB — POCT I-STAT, CHEM 8
Chloride: 108
Glucose, Bld: 165 — ABNORMAL HIGH
HCT: 45
Hemoglobin: 15.3
Potassium: 3.7
Sodium: 142

## 2011-06-06 LAB — DIFFERENTIAL
Basophils Absolute: 0
Eosinophils Absolute: 0.2
Eosinophils Relative: 4
Monocytes Absolute: 0.5

## 2011-06-06 LAB — URINE MICROSCOPIC-ADD ON

## 2011-06-06 LAB — CBC
HCT: 47.1
Hemoglobin: 16.1
MCV: 91.2
Platelets: 132 — ABNORMAL LOW
RDW: 12.9

## 2011-06-19 ENCOUNTER — Other Ambulatory Visit: Payer: Self-pay | Admitting: Orthopaedic Surgery

## 2011-06-19 DIAGNOSIS — M25551 Pain in right hip: Secondary | ICD-10-CM

## 2011-06-21 ENCOUNTER — Ambulatory Visit
Admission: RE | Admit: 2011-06-21 | Discharge: 2011-06-21 | Disposition: A | Discharge status: Home / Self Care | Payer: Medicare Other | Source: Ambulatory Visit | Attending: Orthopaedic Surgery | Admitting: Orthopaedic Surgery

## 2011-06-21 DIAGNOSIS — M25551 Pain in right hip: Secondary | ICD-10-CM

## 2011-07-11 ENCOUNTER — Encounter (HOSPITAL_COMMUNITY): Payer: Self-pay | Admitting: Pharmacy Technician

## 2011-07-12 ENCOUNTER — Encounter (HOSPITAL_COMMUNITY)
Admission: RE | Admit: 2011-07-12 | Discharge: 2011-07-12 | Disposition: A | Discharge status: Home / Self Care | Payer: Medicare Other | Source: Ambulatory Visit | Attending: Orthopaedic Surgery | Admitting: Orthopaedic Surgery

## 2011-07-12 ENCOUNTER — Other Ambulatory Visit: Payer: Self-pay | Admitting: Orthopaedic Surgery

## 2011-07-12 ENCOUNTER — Encounter (HOSPITAL_COMMUNITY): Payer: Self-pay

## 2011-07-12 HISTORY — DX: Calculus of kidney: N20.0

## 2011-07-12 HISTORY — DX: Ventricular premature depolarization: I49.3

## 2011-07-12 HISTORY — DX: Unspecified osteoarthritis, unspecified site: M19.90

## 2011-07-12 HISTORY — DX: Acute upper respiratory infection, unspecified: J06.9

## 2011-07-12 LAB — COMPREHENSIVE METABOLIC PANEL
ALT: 28 U/L (ref 0–53)
AST: 21 U/L (ref 0–37)
CO2: 30 mEq/L (ref 19–32)
Calcium: 10.2 mg/dL (ref 8.4–10.5)
GFR calc non Af Amer: 81 mL/min — ABNORMAL LOW (ref >90)
Sodium: 144 mEq/L (ref 135–145)

## 2011-07-12 LAB — CBC
Hemoglobin: 16.6 g/dL (ref 13.0–17.0)
MCH: 31.9 pg (ref 26.0–34.0)
MCV: 87.9 fL (ref 78.0–100.0)
RBC: 5.2 MIL/uL (ref 4.22–5.81)

## 2011-07-12 LAB — URINALYSIS, ROUTINE W REFLEX MICROSCOPIC
Glucose, UA: NEGATIVE mg/dL
Leukocytes, UA: NEGATIVE
Nitrite: NEGATIVE
Protein, ur: NEGATIVE mg/dL
Urobilinogen, UA: 1 mg/dL (ref 0.0–1.0)

## 2011-07-12 LAB — APTT: aPTT: 30 seconds (ref 24–37)

## 2011-07-12 LAB — TYPE AND SCREEN
ABO/RH(D): O NEG
Antibody Screen: NEGATIVE

## 2011-07-12 LAB — SURGICAL PCR SCREEN: Staphylococcus aureus: NEGATIVE

## 2011-07-12 MED ORDER — CHLORHEXIDINE GLUCONATE 4 % EX LIQD: 60.0000 mL | Freq: Every day | CUTANEOUS | Status: DC

## 2011-07-12 MED ORDER — CHLORHEXIDINE GLUCONATE 4 % EX LIQD: 60.0000 mL | Freq: Once | CUTANEOUS | Status: DC

## 2011-07-12 NOTE — Progress Notes (Addendum)
Pt had ECHO 09/2005, cardiac cath 03/26/2009. Requesting CXR from [facility].

## 2011-07-12 NOTE — Progress Notes (Deleted)
Spoke to Kimberly at Dr. Doreen Salvage office regarding orders for surgery. Dr. Dwain Sarna is currently out of town.

## 2011-07-12 NOTE — Progress Notes (Signed)
Called Dr. Hoy Register office to request orders.

## 2011-07-12 NOTE — Pre-Procedure Instructions (Addendum)
20 [name]  07/12/2011   Your procedure is scheduled on:  November 13  Report to [facility] at 5:30 AM.  Call this number if you have problems the morning of surgery: [phone]   Remember:   Do not eat food:After Midnight.  Do not drink clear liquids: 4 Hours before arrival.  Take these medicines the morning of surgery with A SIP OF WATER: Metoprolol, nitroglycerine   Do not wear jewelry, make-up or nail polish.  Do not wear lotions, powders, or perfumes. You may wear deodorant.  Do not shave 48 hours prior to surgery.  Do not bring valuables to the hospital.  Contacts, dentures or bridgework may not be worn into surgery.  Leave suitcase in the car. After surgery it may be brought to your room.  For patients admitted to the hospital, checkout time is 11:00 AM the day of discharge.   Patients discharged the day of surgery will not be allowed to drive home.  Name and phone number of your driver: [name] [phone]  Special Instructions: CHG Shower Use Special Wash: 1/2 bottle night before surgery and 1/2 bottle morning of surgery. Also shower for three nights prior to surgery starting 11/10.    Please read over the following fact sheets that you were given: Pain Booklet, Coughing and Deep Breathing and Surgical Site Infection Prevention

## 2011-07-13 LAB — URINE CULTURE

## 2011-07-15 ENCOUNTER — Encounter (HOSPITAL_COMMUNITY): Payer: Self-pay

## 2011-07-16 NOTE — H&P (Signed)
COMPLAINT:     Painful right hip.  HPI:  [name] is a very pleasant 76 year old white male who is seen today for evaluation of his right hip and thigh. He did have a total hip replacement 10-12 years ago and has had several bursal injections in the past. He has had trochanteric pain and we actually injected this at his last visit on Jan 09, 2011. After about 15 minutes after the injection, he stated there was no difference in his pain. He underwent a Revision Total Hip Replacement in June of 2012 with replacement of  The femoral head and poly from the acetabulum. He really had immediate relief of that groin and thigh pain, but after some sessions of physical therapy he developed some swelling along the lateral aspect of his hip and after further diagnostic studies we thought it was just a hematoma.  However, he notes that as time has progressed the swelling is somewhat less although it is still present, but he is locally tender over the area of swelling.  He has not had any drainage.  There is no redness.  It is localized just anterior to the distal incision. He at one time had an aspiration of the area with fluid sent which did not grow anything on culture.  Because of recurrent pain he is admitted for exploration of he hip.   PAST MEDICAL HISTORY:  In general his health is fair.  Hospitalizations have included that of a cholecystectomy and hernia repair in 2011. He did have wrist surgery by Dr. Amanda Pea also in 2011.  Bilateral cataracts were performed in 2010. He's also had kidney stone and stents in 2009. Dr. Danielle Dess has performed cervical spine surgery in 2007. A right total hip replacement on 11/03/1999. He's also had 4 previous back surgeries; as well as knees, shoulder, and wrist surgery.   He was also hospitalized in 2010 for viral infection. He also had chest pain which had had a cardiac catheter; not only in 2007 but also in 2010 which were essentially normal. Apparently, he did have gallbladder  problems and they feel that that was the reason for his chest pain. He did undergo cholecystectomy. Recent Revision THR in June 2012.  MEDICATIONS:    Potassium 20 mEq daily, metoprolol 25 mg daily, aspirin 325 mg.   ALLERGIES:                Vancomycin which causes a rash and Fenaladene.  ROS:   Past medical, surgical, social, family histories, medications and ROS are reviewed and are included in the chart. 14 point review of systems is positive for cataract extractions bilaterally. He had pneumonia in 2001. He has also had bronchitis. He has had chest pain but since his cholecystectomy he has had no recurrence.  He does have occasional palpitations and a rapid heart rate but as long as he is on metoprolol, he has no symptoms He does have history of asthma which is under control.  Previous renal calculi in January 2011 with stenting.  Last bladder infection was November 2010. He denies any sleep apnea. Does have a history of migraines. Remainder of 14 point review of systems was negative. Family history is positive for heart disease and diabetes in the mother. Brother with cancer. He is a 76 year old white male who is retired. He denies use tobacco or alcohol.   EXAM: Reveals a very pleasant 76 year old white male who is well-developed, well-nourished, obese, alert, pleasant,  and cooperative.  He is in moderate distress  secondary to right hip and groin pain. He is 6 foot 4 inches.  He weighs 225 pounds.  BMI 27.4 .  Temperature 97.8. Pulse 78.  Respirations 18.  Blood  pressure 130/80.   Head:    Normocephalic.   Eyes:    PERRLA.  Extraocular motions are intact. Neck:    Supple no bruits. Chest:  Good expansion- lungs were clear.  Heart:  Cardiac regular rhythm and rate normal S1-S2 with an occasional ectopic. Abdomen: Obese soft nontender masses and no masses palpable. No bowel sounds present. Genitalia: Genital, rectal, breast exam not indicated for the procedure.  CNS:  Oriented x 3. Cranial  nerves 2 through 12 grossly intact. Musculoskeletal:   Painful ROM of the right hip with internal/external rotation. This is limited secondary to pain. Positive log roll. Negative straight leg raise.   I had previously  reviewed a clearance from Dr. Patty Sermons, his cardiologist,  who feels that he is to continue his current medications but feels he probably is clear to have the surgery from a cardiac standpoint. Dr. Clelia Croft has cleared him now from both a medical and cardiac standpoint also. I have reviewed his notes where he has had previous PVCs.  IMPRESSION: 1. Pain and possible fascial hernia s/p right THRevision.    2. History of ectopics.    3. History of hypokalemia.    4. Obesity.    DISPOSITION: At this time, we feel that his options are to be that of a exploration of R THR and possible fascial hernia repair.  Procedures, risks, and benefits have been explained to him in detail and he is understanding.  Updated   No change since original exam     07/18/2011 [name]. George Holycross, PA-C

## 2011-07-17 MED ORDER — CEFAZOLIN SODIUM-DEXTROSE 2-3 GM-% IV SOLR
2.0000 g | INTRAVENOUS | Status: DC
  Filled 2011-07-17: qty 50

## 2011-07-17 MED ORDER — SODIUM CHLORIDE 0.9 % IV SOLN: INTRAVENOUS | Status: DC

## 2011-07-17 MED ORDER — CHLORHEXIDINE GLUCONATE 4 % EX LIQD: 60.0000 mL | Freq: Once | CUTANEOUS | Status: DC

## 2011-07-17 MED ORDER — CHLORHEXIDINE GLUCONATE 4 % EX LIQD: 60.0000 mL | Freq: Every day | CUTANEOUS | Status: DC

## 2011-07-17 NOTE — Progress Notes (Signed)
CXR From 01/25/11 ON CHART 

## 2011-07-18 ENCOUNTER — Encounter (HOSPITAL_COMMUNITY)
Admission: RE | Disposition: A | Discharge status: Home / Self Care | Payer: Self-pay | Source: Ambulatory Visit | Attending: Orthopaedic Surgery

## 2011-07-18 ENCOUNTER — Encounter (HOSPITAL_COMMUNITY): Payer: Self-pay

## 2011-07-18 ENCOUNTER — Ambulatory Visit (HOSPITAL_COMMUNITY)
Admission: RE | Admit: 2011-07-18 | Discharge: 2011-07-18 | Disposition: A | Discharge status: Home / Self Care | Payer: Medicare Other | Source: Ambulatory Visit | Attending: Orthopaedic Surgery | Admitting: Orthopaedic Surgery

## 2011-07-18 ENCOUNTER — Encounter (HOSPITAL_COMMUNITY): Payer: Self-pay | Admitting: Certified Registered"

## 2011-07-18 ENCOUNTER — Ambulatory Visit (HOSPITAL_COMMUNITY): Payer: Medicare Other | Admitting: Certified Registered"

## 2011-07-18 DIAGNOSIS — M76899 Other specified enthesopathies of unspecified lower limb, excluding foot: Secondary | ICD-10-CM | POA: Insufficient documentation

## 2011-07-18 DIAGNOSIS — M7071 Other bursitis of hip, right hip: Secondary | ICD-10-CM | POA: Diagnosis present

## 2011-07-18 DIAGNOSIS — Z01812 Encounter for preprocedural laboratory examination: Secondary | ICD-10-CM | POA: Insufficient documentation

## 2011-07-18 DIAGNOSIS — Z01811 Encounter for preprocedural respiratory examination: Secondary | ICD-10-CM | POA: Insufficient documentation

## 2011-07-18 HISTORY — PX: INCISION AND DRAINAGE HIP: SHX1801

## 2011-07-18 SURGERY — IRRIGATION AND DEBRIDEMENT HIP
Anesthesia: General | Site: Hip | Laterality: Right | Wound class: Clean

## 2011-07-18 MED ORDER — PROPOFOL 10 MG/ML IV EMUL
INTRAVENOUS | Status: DC | PRN
  Administered 2011-07-18: 100 mg via INTRAVENOUS

## 2011-07-18 MED ORDER — HYDROMORPHONE HCL PF 1 MG/ML IJ SOLN
0.2500 mg | INTRAMUSCULAR | Status: DC | PRN
  Administered 2011-07-18: 0.25 mg via INTRAVENOUS
  Administered 2011-07-18 (×2): 0.5 mg via INTRAVENOUS

## 2011-07-18 MED ORDER — ROCURONIUM BROMIDE 100 MG/10ML IV SOLN
INTRAVENOUS | Status: DC | PRN
  Administered 2011-07-18: 35 mg via INTRAVENOUS

## 2011-07-18 MED ORDER — LACTATED RINGERS IV SOLN
INTRAVENOUS | Status: DC | PRN
  Administered 2011-07-18 (×2): via INTRAVENOUS

## 2011-07-18 MED ORDER — METHOCARBAMOL 500 MG PO TABS: 500.0000 mg | ORAL_TABLET | Freq: Four times a day (QID) | ORAL | Status: AC

## 2011-07-18 MED ORDER — BUPIVACAINE-EPINEPHRINE PF 0.25-1:200000 % IJ SOLN
INTRAMUSCULAR | Status: DC | PRN
  Administered 2011-07-18: 30 mL

## 2011-07-18 MED ORDER — SODIUM CHLORIDE 0.9 % IR SOLN
Status: DC | PRN
  Administered 2011-07-18: 1000 mL

## 2011-07-18 MED ORDER — GLYCOPYRROLATE 0.2 MG/ML IJ SOLN
INTRAMUSCULAR | Status: DC | PRN
  Administered 2011-07-18: 0.2 mg via INTRAVENOUS

## 2011-07-18 MED ORDER — HYDROMORPHONE HCL PF 1 MG/ML IJ SOLN
INTRAMUSCULAR | Status: AC
  Filled 2011-07-18: qty 1

## 2011-07-18 MED ORDER — MEPERIDINE HCL 25 MG/ML IJ SOLN: 6.2500 mg | INTRAMUSCULAR | Status: DC | PRN

## 2011-07-18 MED ORDER — OXYCODONE-ACETAMINOPHEN 5-325 MG PO TABS: 1.0000 | ORAL_TABLET | ORAL | Status: AC | PRN

## 2011-07-18 MED ORDER — FENTANYL CITRATE 0.05 MG/ML IJ SOLN
INTRAMUSCULAR | Status: DC | PRN
  Administered 2011-07-18: 125 ug via INTRAVENOUS

## 2011-07-18 MED ORDER — PHENYLEPHRINE HCL 10 MG/ML IJ SOLN
INTRAMUSCULAR | Status: DC | PRN
  Administered 2011-07-18 (×2): 40 ug via INTRAVENOUS
  Administered 2011-07-18: 80 ug via INTRAVENOUS
  Administered 2011-07-18: 40 ug via INTRAVENOUS

## 2011-07-18 MED ORDER — CEFAZOLIN SODIUM 1-5 GM-% IV SOLN
INTRAVENOUS | Status: DC | PRN
  Administered 2011-07-18: 2 g via INTRAVENOUS

## 2011-07-18 MED ORDER — MIDAZOLAM HCL 5 MG/5ML IJ SOLN
INTRAMUSCULAR | Status: DC | PRN
  Administered 2011-07-18: 2 mg via INTRAVENOUS

## 2011-07-18 MED ORDER — ONDANSETRON HCL 4 MG/2ML IJ SOLN
INTRAMUSCULAR | Status: DC | PRN
  Administered 2011-07-18: 4 mg via INTRAVENOUS

## 2011-07-18 MED ORDER — ONDANSETRON HCL 4 MG/2ML IJ SOLN: 4.0000 mg | Freq: Once | INTRAMUSCULAR | Status: DC | PRN

## 2011-07-18 SURGICAL SUPPLY — 46 items
BAG DECANTER FOR FLEXI CONT (MISCELLANEOUS) ×1 IMPLANT
BNDG COHESIVE 4X5 TAN STRL (GAUZE/BANDAGES/DRESSINGS) ×2 IMPLANT
CLOTH BEACON ORANGE TIMEOUT ST (SAFETY) ×2 IMPLANT
DRAPE ORTHO SPLIT 77X108 STRL (DRAPES) ×4
DRAPE PROXIMA HALF (DRAPES) ×4 IMPLANT
DRAPE SURG ORHT 6 SPLT 77X108 (DRAPES) ×2 IMPLANT
DRSG MEPILEX BORDER 4X12 (GAUZE/BANDAGES/DRESSINGS) ×1 IMPLANT
DRSG PAD ABDOMINAL 8X10 ST (GAUZE/BANDAGES/DRESSINGS) ×2 IMPLANT
DURAPREP 26ML APPLICATOR (WOUND CARE) ×3 IMPLANT
ELECT REM PT RETURN 9FT ADLT (ELECTROSURGICAL) ×2
ELECTRODE REM PT RTRN 9FT ADLT (ELECTROSURGICAL) IMPLANT
GLOVE BIOGEL PI IND STRL 8 (GLOVE) ×1 IMPLANT
GLOVE BIOGEL PI INDICATOR 8 (GLOVE) ×1
GLOVE ECLIPSE 8.0 STRL XLNG CF (GLOVE) ×3 IMPLANT
GOWN PREVENTION PLUS XLARGE (GOWN DISPOSABLE) ×1 IMPLANT
GOWN STRL NON-REIN LRG LVL3 (GOWN DISPOSABLE) ×3 IMPLANT
GOWN STRL REIN 3XL LVL4 (GOWN DISPOSABLE) ×1 IMPLANT
HANDPIECE INTERPULSE COAX TIP (DISPOSABLE)
KIT BASIN OR (CUSTOM PROCEDURE TRAY) ×2 IMPLANT
KIT ROOM TURNOVER OR (KITS) ×2 IMPLANT
MANIFOLD NEPTUNE II (INSTRUMENTS) ×2 IMPLANT
NEEDLE HYPO 22GX1.5 SAFETY (NEEDLE) ×1 IMPLANT
NS IRRIG 1000ML POUR BTL (IV SOLUTION) ×2 IMPLANT
PACK GENERAL/GYN (CUSTOM PROCEDURE TRAY) ×2 IMPLANT
PAD ARMBOARD 7.5X6 YLW CONV (MISCELLANEOUS) ×2 IMPLANT
SET HNDPC FAN SPRY TIP SCT (DISPOSABLE) IMPLANT
SPONGE GAUZE 4X4 12PLY (GAUZE/BANDAGES/DRESSINGS) ×1 IMPLANT
SPONGE LAP 18X18 X RAY DECT (DISPOSABLE) ×2 IMPLANT
SPONGE LAP 4X18 X RAY DECT (DISPOSABLE) ×1 IMPLANT
STAPLER SKIN PROX WIDE 3.9 (STAPLE) ×1 IMPLANT
STOCKINETTE IMPERVIOUS 9X36 MD (GAUZE/BANDAGES/DRESSINGS) ×2 IMPLANT
SUT ETHILON 3 0 PS 1 (SUTURE) IMPLANT
SUT MNCRL AB 3-0 PS2 18 (SUTURE) ×1 IMPLANT
SUT VIC AB 0 CT1 27 (SUTURE) ×6
SUT VIC AB 0 CT1 27XBRD ANBCTR (SUTURE) IMPLANT
SUT VIC AB 1 CT1 27 (SUTURE) ×6
SUT VIC AB 1 CT1 27XBRD ANBCTR (SUTURE) IMPLANT
SUT VIC AB 2-0 CT1 27 (SUTURE) ×4
SUT VIC AB 2-0 CT1 TAPERPNT 27 (SUTURE) IMPLANT
SWAB CULTURE LIQ STUART DBL (MISCELLANEOUS) ×1 IMPLANT
SYR CONTROL 10ML LL (SYRINGE) ×1 IMPLANT
TOWEL OR 17X24 6PK STRL BLUE (TOWEL DISPOSABLE) ×2 IMPLANT
TOWEL OR 17X26 10 PK STRL BLUE (TOWEL DISPOSABLE) ×2 IMPLANT
TUBE ANAEROBIC SPECIMEN COL (MISCELLANEOUS) ×1 IMPLANT
UNDERPAD 30X30 INCONTINENT (UNDERPADS AND DIAPERS) ×1 IMPLANT
WATER STERILE IRR 1000ML POUR (IV SOLUTION) ×1 IMPLANT

## 2011-07-18 NOTE — Transfer of Care (Signed)
Immediate Anesthesia Transfer of Care Note  Patient: [name]  Procedure(s) Performed:  IRRIGATION AND DEBRIDEMENT HIP - RIGHT HIP EXPLORATION, EXCISION OF DEEP BURSAL SAC  Patient Location: PACU  Anesthesia Type: General  Level of Consciousness: awake, alert  and oriented  Airway & Oxygen Therapy: Patient Spontanous Breathing and Patient connected to nasal cannula oxygen  Post-op Assessment: Report given to PACU RN, Post -op Vital signs reviewed and stable and Patient moving all extremities  Post vital signs: Reviewed and stable  Complications: No apparent anesthesia complications

## 2011-07-18 NOTE — Preoperative (Signed)
Beta Blockers   Reason not to administer Beta Blockers:Bet Blocker taken 07/18/11 @ 0445

## 2011-07-18 NOTE — Anesthesia Preprocedure Evaluation (Addendum)
Anesthesia Evaluation    Airway Mallampati: II TM Distance: >3 FB Neck ROM: Full    Dental   Pulmonary          Cardiovascular     Neuro/Psych    GI/Hepatic   Endo/Other    Renal/GU      Musculoskeletal   Abdominal   Peds  Hematology   Anesthesia Other Findings   Reproductive/Obstetrics                           Anesthesia Physical Anesthesia Plan  ASA: II  Anesthesia Plan: General   Post-op Pain Management:    Induction: Intravenous  Airway Management Planned: Oral ETT  Additional Equipment:   Intra-op Plan:   Post-operative Plan: Extubation in OR  Informed Consent: I have reviewed the patients History and Physical, chart, labs and discussed the procedure including the risks, benefits and alternatives for the proposed anesthesia with the patient or authorized representative who has indicated his/her understanding and acceptance.     Plan Discussed with: CRNA and Surgeon  Anesthesia Plan Comments:         Anesthesia Quick Evaluation

## 2011-07-18 NOTE — H&P (View-Only) (Signed)
CXR From 01/25/11 ON CHART

## 2011-07-18 NOTE — Anesthesia Postprocedure Evaluation (Signed)
  Anesthesia Post-op Note  Patient: [name]  Procedure(s) Performed:  IRRIGATION AND DEBRIDEMENT HIP - RIGHT HIP EXPLORATION, EXCISION OF DEEP BURSAL SAC  Patient Location: PACU  Anesthesia Type: General  Level of Consciousness: awake  Airway and Oxygen Therapy: Patient connected to nasal cannula oxygen  Post-op Pain: none  Post-op Assessment: Post-op Vital signs reviewed  Post-op Vital Signs: stable  Complications: No apparent anesthesia complications

## 2011-07-18 NOTE — Anesthesia Procedure Notes (Signed)
Procedure Name: Intubation Date/Time: 07/18/2011 8:17 AM Performed by: Rossie Muskrat Pre-anesthesia Checklist: Patient identified, Emergency Drugs available, Suction available, Patient being monitored and Timeout performed Patient Re-evaluated:Patient Re-evaluated prior to inductionOxygen Delivery Method: Circle System Utilized Preoxygenation: Pre-oxygenation with 100% oxygen Intubation Type: IV induction Ventilation: Mask ventilation without difficulty Laryngoscope Size: Miller and 2 Grade View: Grade I Tube type: Oral Tube size: 7.5 mm Number of attempts: 1 Airway Equipment and Method: stylet Placement Confirmation: ETT inserted through vocal cords under direct vision,  breath sounds checked- equal and bilateral and positive ETCO2 Secured at: 22 cm Tube secured with: Tape Dental Injury: Teeth and Oropharynx as per pre-operative assessment

## 2011-07-18 NOTE — Interval H&P Note (Signed)
History and Physical Interval Note:   07/18/2011   7:39 AM   [name]  has presented today for surgery, with the diagnosis of PAINFUL RIGHT HIP SCAR  The various methods of treatment have been discussed with the patient and family. After consideration of risks, benefits and other options for treatment, the patient has consented to  Procedure(s): IRRIGATION AND DEBRIDEMENT HIP as a surgical intervention .  The patients' history has been reviewed, patient examined, no change in status, stable for surgery.  I have reviewed the patients' chart and labs.  Questions were answered to the patient's satisfaction.     Valeria Batman  MD

## 2011-07-18 NOTE — Brief Op Note (Signed)
07/18/2011  9:23 AM  PATIENT:  [name]  76 y.o. male  PRE-OPERATIVE DIAGNOSIS:  PAINFUL RIGHT HIP SCAR  POST-OPERATIVE DIAGNOSIS:  PAINFUL RIGHT HIP SCAR WITH GREATER TROCHANTERIC BURSA  PROCEDURE:  Procedure(s): IRRIGATION AND DEBRIDEMENT HIP  SURGEON:  Surgeon(s): Valeria Batman, MD  PHYSICIAN ASSISTANT Jacqualine Code, PA-C ANESTHESIA:   general  EBL: 50cc   BLOOD ADMINISTERED:none  DRAINS: none   LOCAL MEDICATIONS USED:  MARCAINE 0.25% 30CC  SPECIMEN:  No Specimen  DISPOSITION OF SPECIMEN:  N/A  COUNTS:  YES  TOURNIQUET:  * No tourniquets in log *  DICTATION: .dictation number [id number]  PLAN OF CARE: Discharge to home after PACU  PATIENT DISPOSITION:  PACU - hemodynamically stable.   Delay start of Pharmacological VTE agent (>24hrs) due to surgical blood loss or risk of bleeding:  {YES/NO/NOT APPLICABLE:20182 PATIENT ID:      [name]  MRN:     [id number] DOB/AGE:    February 17, 1935 / 77 y.o.

## 2011-07-18 NOTE — Interval H&P Note (Signed)
History and Physical Interval Note:   07/18/2011   7:46 AM   [name]  has presented today for surgery, with the diagnosis of PAINFUL RIGHT HIP SCAR  The various methods of treatment have been discussed with the patient and family. After consideration of risks, benefits and other options for treatment, the patient has consented to  Procedure(s): IRRIGATION AND DEBRIDEMENT HIP as a surgical intervention .  The patients' history has been reviewed, patient examined, no change in status, stable for surgery.  I have reviewed the patients' chart and labs.  Questions were answered to the patient's satisfaction.     Valeria Batman  MD

## 2011-07-18 NOTE — Op Note (Signed)
[name], [name]              ACCOUNT NO.:  [id number]  MEDICAL RECORD NO.:  [id number]  LOCATION:  [facility]                         FACILITY:  [facility]  PHYSICIAN:  Claude Manges. Avyana Puffenbarger, M.D.DATE OF BIRTH:  11/14/1934  DATE OF PROCEDURE:  07/18/2011 DATE OF DISCHARGE:                              OPERATIVE REPORT   PREOPERATIVE DIAGNOSIS:  Painful right hip scar with possible deep bursa.  POSTOPERATIVE DIAGNOSIS:  Painful deep bursa including greater trochanteric bursa.  PROCEDURE:  Exploration of right hip wound with excision of complex bursa formation with pan cultures.  SURGEON:  Claude Manges. Cleophas Dunker, MD  ASSISTANT:  Oris Drone. Petrarca, PA-C  ANESTHESIA:  General.  COMPLICATIONS:  None.  PROCEDURE:  [name] was met in the holding area, identified his right hip as the appropriate operative site.  The patient was then transported to room number 1 and placed under general anesthesia without difficulty.  The patient was then placed in the lateral decubitus position with the right side up and secured to the operating room table with the Innomed hip system.  The right hip was then prepped with DuraPrep from the iliac crest distal to the knee.  Sterile draping was performed.  The previous southern incision was utilized, it was elliptically excised.  By Bovie dissection, incision was carried down through the subcutaneous tissue and initial adipose tissue.  There were no abnormalities.  The iliotibial band was identified and carefully incised at which point, we encountered a complex bursa, this extended from the greater trochanter posterior in the wound.  There was very minimal fluid. Cultures were sent for anaerobic and anaerobic bacteria.  Using the Bovie, the bursa was excised.  There was 1 area that was revealing some erythematous bursal material.  This was also excised.  I did not see any fluid.  The bursa did not tract into the deep tissues.  Several Ethibond sutures  were identified and removed.  There was no further visible bursa formation identified.  The wound was irrigated with saline solution, was closed anatomically in several layers with #1 Vicryl, 0-Vicryl, 2-0 Vicryl, and 3-0 Monocryl.  Sterile bulky dressing was applied followed by a compressive dressing.  The patient tolerated the procedure without complications.     Claude Manges. Cleophas Dunker, M.D.     PWW/MEDQ  D:  07/18/2011  T:  07/18/2011  Job:  [id number]

## 2011-07-21 ENCOUNTER — Encounter (HOSPITAL_COMMUNITY): Payer: Self-pay | Admitting: Orthopaedic Surgery

## 2011-07-21 LAB — BODY FLUID CULTURE
Culture: NO GROWTH
Gram Stain: NONE SEEN

## 2011-07-23 LAB — ANAEROBIC CULTURE

## 2011-08-04 ENCOUNTER — Other Ambulatory Visit (INDEPENDENT_AMBULATORY_CARE_PROVIDER_SITE_OTHER): Payer: Medicare Other | Admitting: *Deleted

## 2011-08-04 DIAGNOSIS — E7800 Pure hypercholesterolemia, unspecified: Secondary | ICD-10-CM

## 2011-08-04 LAB — BASIC METABOLIC PANEL
Calcium: 8.9 mg/dL (ref 8.4–10.5)
Chloride: 107 mEq/L (ref 96–112)
Creatinine, Ser: 0.8 mg/dL (ref 0.4–1.5)
Sodium: 141 mEq/L (ref 135–145)

## 2011-08-04 LAB — HEPATIC FUNCTION PANEL
Alkaline Phosphatase: 50 U/L (ref 39–117)
Bilirubin, Direct: 0.2 mg/dL (ref 0.0–0.3)
Total Bilirubin: 1.3 mg/dL — ABNORMAL HIGH (ref 0.3–1.2)

## 2011-08-04 LAB — LIPID PANEL
HDL: 43.9 mg/dL (ref >39.00)
LDL Cholesterol: 105 mg/dL — ABNORMAL HIGH (ref 0–99)
Total CHOL/HDL Ratio: 4
Triglycerides: 111 mg/dL (ref 0.0–149.0)
VLDL: 22.2 mg/dL (ref 0.0–40.0)

## 2011-08-09 ENCOUNTER — Encounter: Payer: Self-pay | Admitting: Cardiology

## 2011-08-09 ENCOUNTER — Ambulatory Visit (INDEPENDENT_AMBULATORY_CARE_PROVIDER_SITE_OTHER): Payer: Medicare Other | Admitting: Cardiology

## 2011-08-09 VITALS — BP 110/74 | HR 70 | Ht 76.0 in | Wt 228.0 lb

## 2011-08-09 DIAGNOSIS — I119 Hypertensive heart disease without heart failure: Secondary | ICD-10-CM

## 2011-08-09 DIAGNOSIS — E7800 Pure hypercholesterolemia, unspecified: Secondary | ICD-10-CM

## 2011-08-09 DIAGNOSIS — E785 Hyperlipidemia, unspecified: Secondary | ICD-10-CM

## 2011-08-09 DIAGNOSIS — M1990 Unspecified osteoarthritis, unspecified site: Secondary | ICD-10-CM

## 2011-08-09 NOTE — Patient Instructions (Signed)
Your physician recommends that you continue on your current medications as directed. Please refer to the Current Medication list given to you today. Your physician wants you to follow-up in: 4 months You will receive a reminder letter in the mail two months in advance. If you don't receive a letter, please call our office to schedule the follow-up appointment.  

## 2011-08-09 NOTE — Assessment & Plan Note (Signed)
The patient is on no cholesterol lowering medication.  Cholesterol levels are borderline are better being controlled with diet alone

## 2011-08-09 NOTE — Assessment & Plan Note (Signed)
The patient had recent revision of his right hip surgery followed by postoperative hematoma.  He is now back to walking and does walk with a limp and is still having moderate discomfort.

## 2011-08-09 NOTE — Progress Notes (Signed)
[name] Date of Birth:  06-03-1935 [facility] [address]   [address] [place], [place]  [address] [phone]           Fax   [phone]  History of Present Illness: This pleasant 76 year old gentleman is seen for a scheduled followup office visit.  He has a past history of atypical chest pain.  He has had several prior cardiac catheterizations which have been normal.  His chest pain turned out to be chronic cholecystitis and he eventually underwent laparoscopic cholecystectomy in January 2011 and subsequent ERCP for removal of common duct stones.  Dr. make.is his gastroenterologist and Dr. Zachery Dakins is his surgeon.  Recently the patient has had problems with a right hip replacement with revision and is still not walking much he did have a postoperative hematoma at the surgical site.  Current Outpatient Prescriptions  Medication Sig Dispense Refill  . Ascorbic Acid (VITAMIN C) 500 MG tablet Take 1 tablet (500 mg total) by mouth daily.  30 tablet    . aspirin 325 MG tablet Take 325 mg by mouth daily.        . calcium carbonate (TUMS - DOSED IN MG ELEMENTAL CALCIUM) 500 MG chewable tablet Chew 2 tablets by mouth daily. As needed       . metoprolol tartrate (LOPRESSOR) 25 MG tablet Take 25 mg by mouth daily.        . nitroGLYCERIN (NITROSTAT) 0.4 MG SL tablet Place 0.4 mg under the tongue every 5 (five) minutes as needed. For chest pain      . potassium chloride SA (K-DUR) 20 MEQ tablet          Allergies  Allergen Reactions  . Erythromycin Other (See Comments)    unknown  . Tape Other (See Comments)    Adhesive tape blisters  . Tetracyclines & Related   . Sulfa Antibiotics Rash    Patient Active Problem List  Diagnoses  . Benign hypertensive heart disease without heart failure  . BPH (benign prostatic hyperplasia)  . Osteoarthritis  . Dyslipidemia  . Status post cholecystectomy  . Trochanteric Bursitis of right hip    History    Smoking status  . Never Smoker   Smokeless tobacco  . Not on file    History  Alcohol Use No    Family History  Problem Relation Age of Onset  . Heart disease Mother   . Heart attack Mother   . Anesthesia problems Neg Hx   . Hypotension Neg Hx   . Malignant hyperthermia Neg Hx   . Pseudochol deficiency Neg Hx     Review of Systems: Constitutional: no fever chills diaphoresis or fatigue or change in weight.  Head and neck: no hearing loss, no epistaxis, no photophobia or visual disturbance. Respiratory: No cough, shortness of breath or wheezing. Cardiovascular: No chest pain peripheral edema, palpitations. Gastrointestinal: No abdominal distention, no abdominal pain, no change in bowel habits hematochezia or melena. Genitourinary: No dysuria, no frequency, no urgency, no nocturia. Musculoskeletal:No arthralgias, no back pain, no gait disturbance or myalgias. Neurological: No dizziness, no headaches, no numbness, no seizures, no syncope, no weakness, no tremors. Hematologic: No lymphadenopathy, no easy bruising. Psychiatric: No confusion, no hallucinations, no sleep disturbance.    Physical Exam: Filed Vitals:   08/09/11 0856  BP: 110/74  Pulse: 70   the general appearance reveals a well-developed well-nourished elderly gentleman in no distress.The head and neck exam reveals pupils equal and reactive.  Extraocular movements are full.  There is no scleral icterus.  The mouth and pharynx are normal.  The neck is supple.  The carotids reveal no bruits.  The jugular venous pressure is normal.  The  thyroid is not enlarged.  There is no lymphadenopathy.  The chest is clear to percussion and auscultation.  There are no rales or rhonchi.  Expansion of the chest is symmetrical.  The precordium is quiet.  The first heart sound is normal.  The second heart sound is physiologically split.  There is no murmur gallop rub or click.  There is no abnormal lift or heave.  The abdomen is soft  and nontender.  The bowel sounds are normal.  The liver and spleen are not enlarged.  There are no abdominal masses.  There are no abdominal bruits.  Extremities reveal good pedal pulses.  There is no phlebitis or edema.  There is no cyanosis or clubbing.  Strength is normal and symmetrical in all extremities.  There is no lateralizing weakness.  There are no sensory deficits.  The patient does walk with a limp because of residual right hip pain postoperatively.  The skin is warm and dry.  There is no rash.     Assessment / Plan: Continue same medication.  Recheck in 4 months for followup office visit and fasting lab work

## 2011-08-09 NOTE — Assessment & Plan Note (Signed)
No headaches.  No dizziness.  Chest pain.

## 2011-08-13 ENCOUNTER — Encounter (HOSPITAL_COMMUNITY): Payer: Self-pay

## 2011-08-13 ENCOUNTER — Emergency Department (HOSPITAL_COMMUNITY)
Admission: EM | Admit: 2011-08-13 | Discharge: 2011-08-13 | Disposition: A | Discharge status: Home / Self Care | Payer: Medicare Other | Attending: Emergency Medicine | Admitting: Emergency Medicine

## 2011-08-13 ENCOUNTER — Emergency Department (HOSPITAL_COMMUNITY): Payer: Medicare Other

## 2011-08-13 DIAGNOSIS — N201 Calculus of ureter: Secondary | ICD-10-CM | POA: Insufficient documentation

## 2011-08-13 DIAGNOSIS — N200 Calculus of kidney: Secondary | ICD-10-CM | POA: Insufficient documentation

## 2011-08-13 DIAGNOSIS — Z7982 Long term (current) use of aspirin: Secondary | ICD-10-CM | POA: Insufficient documentation

## 2011-08-13 DIAGNOSIS — R1031 Right lower quadrant pain: Secondary | ICD-10-CM | POA: Insufficient documentation

## 2011-08-13 DIAGNOSIS — R110 Nausea: Secondary | ICD-10-CM | POA: Insufficient documentation

## 2011-08-13 LAB — URINALYSIS, ROUTINE W REFLEX MICROSCOPIC
Bilirubin Urine: NEGATIVE
Ketones, ur: NEGATIVE mg/dL
Nitrite: NEGATIVE
Protein, ur: NEGATIVE mg/dL
Urobilinogen, UA: 1 mg/dL (ref 0.0–1.0)

## 2011-08-13 LAB — CBC
Hemoglobin: 15.2 g/dL (ref 13.0–17.0)
MCH: 31 pg (ref 26.0–34.0)
MCHC: 35.1 g/dL (ref 30.0–36.0)
Platelets: 139 10*3/uL — ABNORMAL LOW (ref 150–400)
RDW: 13 % (ref 11.5–15.5)

## 2011-08-13 LAB — POCT I-STAT, CHEM 8
Glucose, Bld: 136 mg/dL — ABNORMAL HIGH (ref 70–99)
HCT: 47 % (ref 39.0–52.0)
Hemoglobin: 16 g/dL (ref 13.0–17.0)
Potassium: 3.6 mEq/L (ref 3.5–5.1)

## 2011-08-13 LAB — DIFFERENTIAL
Basophils Relative: 0 % (ref 0–1)
Eosinophils Absolute: 0.2 10*3/uL (ref 0.0–0.7)
Monocytes Relative: 8 % (ref 3–12)
Neutrophils Relative %: 67 % (ref 43–77)

## 2011-08-13 LAB — URINE MICROSCOPIC-ADD ON

## 2011-08-13 MED ORDER — ONDANSETRON HCL 4 MG PO TABS: 4.0000 mg | ORAL_TABLET | Freq: Four times a day (QID) | ORAL | Status: AC

## 2011-08-13 MED ORDER — ONDANSETRON HCL 4 MG/2ML IJ SOLN
4.0000 mg | Freq: Once | INTRAMUSCULAR | Status: AC
  Administered 2011-08-13: 4 mg via INTRAVENOUS
  Filled 2011-08-13: qty 2

## 2011-08-13 MED ORDER — SODIUM CHLORIDE 0.9 % IV BOLUS (SEPSIS): 1000.0000 mL | Freq: Once | INTRAVENOUS | Status: DC

## 2011-08-13 MED ORDER — HYDROMORPHONE HCL PF 1 MG/ML IJ SOLN
1.0000 mg | Freq: Once | INTRAMUSCULAR | Status: AC
  Administered 2011-08-13: 1 mg via INTRAVENOUS
  Filled 2011-08-13: qty 1

## 2011-08-13 MED ORDER — SODIUM CHLORIDE 0.9 % IV SOLN: Freq: Once | INTRAVENOUS | Status: DC

## 2011-08-13 MED ORDER — OXYCODONE-ACETAMINOPHEN 5-325 MG PO TABS: 1.0000 | ORAL_TABLET | ORAL | Status: AC | PRN

## 2011-08-13 NOTE — ED Provider Notes (Signed)
History     CSN: [id number] Arrival date & time: 08/13/2011  9:15 AM   First MD Initiated Contact with Patient 08/13/11 1000      Chief Complaint  Patient presents with  . Flank Pain    (Consider location/radiation/quality/duration/timing/severity/associated sxs/prior treatment) Patient is a 76 y.o. male presenting with flank pain. The history is provided by the patient.  Flank Pain This is a new problem. The current episode started yesterday. The problem occurs intermittently. The problem has been rapidly worsening. Associated symptoms include nausea. Pertinent negatives include no abdominal pain, anorexia, change in bowel habit, chills, fever, myalgias, rash, urinary symptoms, vomiting or weakness. He has tried position changes for the symptoms.  Pt with hx of nephrolithiasis - he has had flank pain intermittently for the past 2 days. Pain feels similar to previous stones. It has gotten much worse over the past 12 hours. He took 1 5/325 Percocet at home last night which did not relieve his pain. No urinary sx; has not noted any blood in urine. No change in BMs.  Urologist is Dr. Isabel Caprice with [facility].  Past Medical History  Diagnosis Date  . Asthma     only if develops cold  . Recurrent upper respiratory infection (URI) 03/2011  . Kidney stone on left side   . Arthritis   . PVC (premature ventricular contraction)     Past Surgical History  Procedure Date  . Cardiac catheterization     july 2010-minor irregl  . Kidney stone surgery 2011    R  . Back surgery     x 4  . Total hip arthroplasty     Right  . Total hip revision     Right  . Shoulder arthroscopy     Left  . Wrist surgery     bilateral, plates  . Knee arthroscopy     Left  . Cholecystectomy   . Appendectomy   . Eye surgery     bilat cataract  . Incision and drainage hip 07/18/2011    Procedure: IRRIGATION AND DEBRIDEMENT HIP;  Surgeon: Valeria Batman, MD;  Location: [facility];  Service: Orthopedics;   Laterality: Right;  RIGHT HIP EXPLORATION, EXCISION OF DEEP BURSAL SAC    Family History  Problem Relation Age of Onset  . Heart disease Mother   . Heart attack Mother   . Anesthesia problems Neg Hx   . Hypotension Neg Hx   . Malignant hyperthermia Neg Hx   . Pseudochol deficiency Neg Hx     History  Substance Use Topics  . Smoking status: Never Smoker   . Smokeless tobacco: Not on file  . Alcohol Use: No      Review of Systems  Constitutional: Negative for fever, chills and activity change.  HENT: Negative.   Eyes: Negative.   Respiratory: Negative for shortness of breath.   Cardiovascular: Negative.   Gastrointestinal: Positive for nausea. Negative for vomiting, abdominal pain, diarrhea, constipation, blood in stool, rectal pain, anorexia and change in bowel habit.  Genitourinary: Positive for flank pain. Negative for dysuria, hematuria, decreased urine volume, scrotal swelling, difficulty urinating, penile pain and testicular pain.  Musculoskeletal: Negative for myalgias.  Skin: Negative for rash.  Neurological: Negative for dizziness and weakness.    Allergies  Tape; Erythromycin; Sulfa antibiotics; and Tetracyclines & related  Home Medications   Current Outpatient Rx  Name Route Sig Dispense Refill  . VITAMIN C 500 MG PO TABS Oral Take 1 tablet (500 mg total) by  mouth daily. 30 tablet   . ASPIRIN 325 MG PO TABS Oral Take 325 mg by mouth daily.      Marland Kitchen CALCIUM CARBONATE ANTACID 500 MG PO CHEW Oral Chew 2 tablets by mouth daily. As needed     . METOPROLOL TARTRATE 25 MG PO TABS Oral Take 25 mg by mouth daily.      . OXYCODONE-ACETAMINOPHEN 5-325 MG PO TABS Oral Take 1 tablet by mouth Every 6 hours as needed. For pain.    Marland Kitchen POTASSIUM CHLORIDE CRYS CR 20 MEQ PO TBCR Oral Take 20 mEq by mouth daily.     Marland Kitchen NITROGLYCERIN 0.4 MG SL SUBL Sublingual Place 0.4 mg under the tongue every 5 (five) minutes as needed. For chest pain      BP 123/74  Pulse 76  Temp(Src) 98.2 F  (36.8 C) (Oral)  Resp 18  SpO2 100%  Physical Exam  Nursing note and vitals reviewed. Constitutional: He is oriented to person, place, and time. He appears well-developed and well-nourished. No distress.       Pt appears uncomfortable and is moving about on the bed 2/2 pain  HENT:  Head: Normocephalic and atraumatic.  Eyes: Conjunctivae are normal. Pupils are equal, round, and reactive to light.  Neck: Normal range of motion.  Cardiovascular: Normal rate, regular rhythm and normal heart sounds.   Pulmonary/Chest: Effort normal and breath sounds normal. No respiratory distress. He exhibits no tenderness.  Abdominal: Soft. Bowel sounds are normal.       TTP in R flank, + CVA tenderness; tenderness radiating around to RLQ  Musculoskeletal: Normal range of motion.  Neurological: He is alert and oriented to person, place, and time.  Skin: Skin is warm and dry. No rash noted. He is not diaphoretic.    ED Course  Procedures (including critical care time)  Labs Reviewed  URINALYSIS, ROUTINE W REFLEX MICROSCOPIC - Abnormal; Notable for the following:    Color, Urine AMBER (*) BIOCHEMICALS MAY BE AFFECTED BY COLOR   Hgb urine dipstick LARGE (*)    Leukocytes, UA TRACE (*)    All other components within normal limits  CBC - Abnormal; Notable for the following:    Platelets 139 (*)    All other components within normal limits  POCT I-STAT, CHEM 8 - Abnormal; Notable for the following:    Glucose, Bld 136 (*)    All other components within normal limits  URINE MICROSCOPIC-ADD ON - Abnormal; Notable for the following:    Bacteria, UA FEW (*)    All other components within normal limits  DIFFERENTIAL  URINE CULTURE   Ct Abdomen Pelvis Wo Contrast  08/13/2011  *RADIOLOGY REPORT*  Clinical Data: Right flank pain, history of left renal calculi  CT ABDOMEN AND PELVIS WITHOUT CONTRAST  Technique:  Multidetector CT imaging of the abdomen and pelvis was performed following the standard protocol  without intravenous contrast.  Comparison: [place] CT abdomen pelvis dated 06/23/2008  Findings: Mild subpleural reticulation at the lung bases.  Cardiomegaly.  Liver is notable for pneumobilia, presumably related to prior sphincterotomy, and a calcified granuloma.  Status post cholecystectomy.  No intrahepatic or extrahepatic ductal dilatation.  Unenhanced spleen, pancreas, and adrenal glands within normal limits.  3 mm nonobstructing left lower pole renal calculus (series 2/image 39).  Mild right hydronephrosis.  No evidence of bowel obstruction.  Colonic diverticulosis, without associate inflammatory changes.  Atherosclerotic calcifications of the abdominal aorta and branch vessels.  No abdominopelvic ascites.  No suspicious  abdominopelvic lymphadenopathy.  Mild right hydroureter.  Punctate calculus at the right UVJ (series 2/image 81).  Prostate is mildly enlarged, measuring 5.5 cm.  Fat-containing left inguinal hernia.  Moderate generative changes of the visualized thoracolumbar spine, most prominent at L5-S1.  Right total arthroplasty, without evidence of hardware complication.  IMPRESSION: Punctate distal right ureteral calculus at the UVJ with mild right hydronephrosis.  3 mm nonobstructing left lower pole renal calculus.  No hydronephrosis.  Additional ancillary findings as above.  Original Report Authenticated By: Charline Bills, M.D.     1. Nephrolithiasis       MDM  10:08 AM Patient examined. History and exam suspicious for nephrolithiasis. Will hydrate, control pain, and obtain labs/imaging. Will reassess.  11:00 AM Patient resting much more comfortably after receiving pain medication. CT stone study shows punctate calculus at the UVJ on the right. Awaiting urinalysis. Will reassess.  11:49 AM Pt's u/a with trace leuk esterase and few bacteria. Lg HGB. Based on this information, does not appear concerning for concomitant UTI. Urine sent for cx. Discussed with Dr.  Karma Ganja.  Discussed findings with pt and family at bedside. Will plan to treat symptoms of nausea/pain and have him make f/u with Dr. Isabel Caprice to get rechecked this week. Was instructed to strain urine. Family verbalized understanding and agreed to plan.    Grant Fontana, [place] 08/14/11 1319

## 2011-08-13 NOTE — ED Notes (Signed)
Pt in from home with right side flank pain states onset 2 days ago with worsening today pt states a hx of kidney stones states pain radiates to the right groin denies difficulty urinating states nausea denies vomiting

## 2011-08-13 NOTE — ED Notes (Signed)
Patient transported to CT 

## 2011-08-14 NOTE — ED Provider Notes (Signed)
Medical screening examination/treatment/procedure(s) were performed by non-physician practitioner and as supervising physician I was immediately available for consultation/collaboration.  Ethelda Chick, MD 08/14/11 [phone]

## 2011-08-16 LAB — URINE CULTURE

## 2011-08-17 NOTE — ED Notes (Signed)
+   urine Chart sent to EDP office for review. 

## 2011-08-31 ENCOUNTER — Other Ambulatory Visit: Payer: Self-pay | Admitting: Cardiology

## 2011-09-10 ENCOUNTER — Ambulatory Visit (INDEPENDENT_AMBULATORY_CARE_PROVIDER_SITE_OTHER): Payer: Medicare Other

## 2011-09-10 DIAGNOSIS — M542 Cervicalgia: Secondary | ICD-10-CM

## 2011-09-10 DIAGNOSIS — R51 Headache: Secondary | ICD-10-CM

## 2011-09-10 DIAGNOSIS — E860 Dehydration: Secondary | ICD-10-CM | POA: Diagnosis not present

## 2011-09-10 DIAGNOSIS — R110 Nausea: Secondary | ICD-10-CM

## 2011-09-10 DIAGNOSIS — R6889 Other general symptoms and signs: Secondary | ICD-10-CM | POA: Diagnosis not present

## 2011-09-10 DIAGNOSIS — Z23 Encounter for immunization: Secondary | ICD-10-CM | POA: Diagnosis not present

## 2011-09-13 ENCOUNTER — Ambulatory Visit
Admission: RE | Admit: 2011-09-13 | Discharge: 2011-09-13 | Disposition: A | Discharge status: Home / Self Care | Payer: Medicare Other | Source: Ambulatory Visit | Attending: Emergency Medicine | Admitting: Emergency Medicine

## 2011-09-13 ENCOUNTER — Other Ambulatory Visit: Payer: Self-pay | Admitting: Emergency Medicine

## 2011-09-13 ENCOUNTER — Ambulatory Visit (INDEPENDENT_AMBULATORY_CARE_PROVIDER_SITE_OTHER): Payer: Medicare Other

## 2011-09-13 DIAGNOSIS — Z23 Encounter for immunization: Secondary | ICD-10-CM | POA: Diagnosis not present

## 2011-09-13 DIAGNOSIS — R51 Headache: Secondary | ICD-10-CM

## 2011-09-13 DIAGNOSIS — R519 Headache, unspecified: Secondary | ICD-10-CM

## 2011-09-13 DIAGNOSIS — E860 Dehydration: Secondary | ICD-10-CM

## 2011-09-13 DIAGNOSIS — R634 Abnormal weight loss: Secondary | ICD-10-CM

## 2011-09-13 DIAGNOSIS — R110 Nausea: Secondary | ICD-10-CM

## 2011-09-13 DIAGNOSIS — M542 Cervicalgia: Secondary | ICD-10-CM | POA: Diagnosis not present

## 2011-09-14 DIAGNOSIS — R3129 Other microscopic hematuria: Secondary | ICD-10-CM | POA: Diagnosis not present

## 2011-09-14 DIAGNOSIS — N200 Calculus of kidney: Secondary | ICD-10-CM | POA: Diagnosis not present

## 2011-09-14 DIAGNOSIS — N201 Calculus of ureter: Secondary | ICD-10-CM | POA: Diagnosis not present

## 2011-09-14 DIAGNOSIS — N138 Other obstructive and reflux uropathy: Secondary | ICD-10-CM | POA: Diagnosis not present

## 2011-09-14 DIAGNOSIS — N401 Enlarged prostate with lower urinary tract symptoms: Secondary | ICD-10-CM | POA: Diagnosis not present

## 2011-09-15 ENCOUNTER — Encounter (HOSPITAL_COMMUNITY): Payer: Self-pay | Admitting: *Deleted

## 2011-09-15 ENCOUNTER — Ambulatory Visit (INDEPENDENT_AMBULATORY_CARE_PROVIDER_SITE_OTHER): Payer: Medicare Other

## 2011-09-15 ENCOUNTER — Ambulatory Visit (HOSPITAL_COMMUNITY)
Admission: EM | Admit: 2011-09-15 | Discharge: 2011-09-15 | Disposition: A | Discharge status: Home / Self Care | Payer: Medicare Other | Attending: Emergency Medicine | Admitting: Emergency Medicine

## 2011-09-15 ENCOUNTER — Emergency Department (HOSPITAL_COMMUNITY): Payer: Medicare Other

## 2011-09-15 DIAGNOSIS — R319 Hematuria, unspecified: Secondary | ICD-10-CM

## 2011-09-15 DIAGNOSIS — Z96649 Presence of unspecified artificial hip joint: Secondary | ICD-10-CM | POA: Insufficient documentation

## 2011-09-15 DIAGNOSIS — M129 Arthropathy, unspecified: Secondary | ICD-10-CM | POA: Diagnosis not present

## 2011-09-15 DIAGNOSIS — R509 Fever, unspecified: Secondary | ICD-10-CM | POA: Diagnosis not present

## 2011-09-15 DIAGNOSIS — IMO0002 Reserved for concepts with insufficient information to code with codable children: Secondary | ICD-10-CM | POA: Diagnosis not present

## 2011-09-15 DIAGNOSIS — J45909 Unspecified asthma, uncomplicated: Secondary | ICD-10-CM | POA: Diagnosis not present

## 2011-09-15 DIAGNOSIS — R110 Nausea: Secondary | ICD-10-CM | POA: Insufficient documentation

## 2011-09-15 DIAGNOSIS — R51 Headache: Secondary | ICD-10-CM | POA: Insufficient documentation

## 2011-09-15 LAB — GRAM STAIN

## 2011-09-15 LAB — CBC
HCT: 40.4 % (ref 39.0–52.0)
Hemoglobin: 13.9 g/dL (ref 13.0–17.0)
MCH: 30.2 pg (ref 26.0–34.0)
MCHC: 34.4 g/dL (ref 30.0–36.0)
MCV: 87.8 fL (ref 78.0–100.0)
Platelets: 215 10*3/uL (ref 150–400)
RBC: 4.6 MIL/uL (ref 4.22–5.81)
RDW: 12.6 % (ref 11.5–15.5)
WBC: 7.1 10*3/uL (ref 4.0–10.5)

## 2011-09-15 LAB — BASIC METABOLIC PANEL WITH GFR
BUN: 10 mg/dL (ref 6–23)
CO2: 25 meq/L (ref 19–32)
Calcium: 9.2 mg/dL (ref 8.4–10.5)
Chloride: 105 meq/L (ref 96–112)
Creatinine, Ser: 0.67 mg/dL (ref 0.50–1.35)
GFR calc Af Amer: >90 mL/min
GFR calc non Af Amer: >90 mL/min
Glucose, Bld: 127 mg/dL — ABNORMAL HIGH (ref 70–99)
Potassium: 4 meq/L (ref 3.5–5.1)
Sodium: 140 meq/L (ref 135–145)

## 2011-09-15 LAB — URINALYSIS, ROUTINE W REFLEX MICROSCOPIC
Bilirubin Urine: NEGATIVE
Glucose, UA: NEGATIVE mg/dL
Ketones, ur: NEGATIVE mg/dL
Leukocytes, UA: NEGATIVE
Nitrite: NEGATIVE
Protein, ur: NEGATIVE mg/dL
Specific Gravity, Urine: 1.006 (ref 1.005–1.030)
Urobilinogen, UA: 1 mg/dL (ref 0.0–1.0)
pH: 7 (ref 5.0–8.0)

## 2011-09-15 LAB — CSF CELL COUNT WITH DIFFERENTIAL
RBC Count, CSF: 1 /mm3 — ABNORMAL HIGH
RBC Count, CSF: 29 /mm3 — ABNORMAL HIGH
Tube #: 1
WBC, CSF: 2 /mm3 (ref 0–5)
WBC, CSF: 0 /mm3 (ref 0–5)

## 2011-09-15 LAB — PROTEIN AND GLUCOSE, CSF
Glucose, CSF: 70 mg/dL (ref 43–76)
Total  Protein, CSF: 27 mg/dL (ref 15–45)

## 2011-09-15 LAB — CSF CULTURE W GRAM STAIN
Culture: NO GROWTH
Special Requests: NORMAL

## 2011-09-15 LAB — URINE MICROSCOPIC-ADD ON

## 2011-09-15 MED ORDER — TRIAMCINOLONE ACETONIDE 10 MG/ML IJ SUSP
10.0000 mg | Freq: Once | INTRAMUSCULAR | Status: DC
  Filled 2011-09-15: qty 1

## 2011-09-15 MED ORDER — PREDNISONE 10 MG PO TABS: 10.0000 mg | ORAL_TABLET | Freq: Every day | ORAL | Status: DC

## 2011-09-15 MED ORDER — PROMETHAZINE HCL 25 MG/ML IJ SOLN
12.5000 mg | Freq: Once | INTRAMUSCULAR | Status: AC
  Administered 2011-09-15: 12.5 mg via INTRAVENOUS
  Filled 2011-09-15 (×2): qty 1

## 2011-09-15 MED ORDER — HYDROMORPHONE HCL PF 1 MG/ML IJ SOLN
1.0000 mg | Freq: Once | INTRAMUSCULAR | Status: AC
  Administered 2011-09-15: 1 mg via INTRAVENOUS
  Filled 2011-09-15: qty 1

## 2011-09-15 MED ORDER — KETOROLAC TROMETHAMINE 30 MG/ML IJ SOLN
30.0000 mg | Freq: Once | INTRAMUSCULAR | Status: AC
  Administered 2011-09-15: 30 mg via INTRAVENOUS
  Filled 2011-09-15: qty 1

## 2011-09-15 MED ORDER — LIDOCAINE HCL (PF) 1 % IJ SOLN
5.0000 mL | Freq: Once | INTRAMUSCULAR | Status: DC
  Filled 2011-09-15: qty 5

## 2011-09-15 MED ORDER — PROMETHAZINE HCL 25 MG/ML IJ SOLN
12.5000 mg | INTRAMUSCULAR | Status: AC
  Administered 2011-09-15: 12.5 mg via INTRAVENOUS
  Filled 2011-09-15: qty 1

## 2011-09-15 MED ORDER — MORPHINE SULFATE 4 MG/ML IJ SOLN: 4.0000 mg | Freq: Once | INTRAMUSCULAR | Status: DC

## 2011-09-15 MED ORDER — DEXAMETHASONE SODIUM PHOSPHATE 10 MG/ML IJ SOLN
10.0000 mg | Freq: Once | INTRAMUSCULAR | Status: AC
  Administered 2011-09-15: 10 mg via INTRAVENOUS
  Filled 2011-09-15: qty 1

## 2011-09-15 NOTE — ED Provider Notes (Signed)
History     CSN: [id number]  Arrival date & time 09/15/11  1103   First MD Initiated Contact with Patient 09/15/11 1121      No chief complaint on file.   (Consider location/radiation/quality/duration/timing/severity/associated sxs/prior treatment) HPI Patient presented to the emergency department with a headache and right-sided neck pain has been persistent for the last 2 weeks.  Patient states that he was seen at [facility]  for this.  She denies chest pain, shortness of breath, weakness, numbness, vomiting, diarrhea, abdominal pain, blurred vision, or back pain.  Patient states he has had nausea along with the headache. Past Medical History  Diagnosis Date  . Asthma     only if develops cold  . Recurrent upper respiratory infection (URI) 03/2011  . Kidney stone on left side   . Arthritis   . PVC (premature ventricular contraction)     Past Surgical History  Procedure Date  . Cardiac catheterization     july 2010-minor irregl  . Kidney stone surgery 2011    R  . Back surgery     x 4  . Total hip arthroplasty     Right  . Total hip revision     Right  . Shoulder arthroscopy     Left  . Wrist surgery     bilateral, plates  . Knee arthroscopy     Left  . Cholecystectomy   . Appendectomy   . Eye surgery     bilat cataract  . Incision and drainage hip 07/18/2011    Procedure: IRRIGATION AND DEBRIDEMENT HIP;  Surgeon: Valeria Batman, MD;  Location: [facility] OR;  Service: Orthopedics;  Laterality: Right;  RIGHT HIP EXPLORATION, EXCISION OF DEEP BURSAL SAC    Family History  Problem Relation Age of Onset  . Heart disease Mother   . Heart attack Mother   . Anesthesia problems Neg Hx   . Hypotension Neg Hx   . Malignant hyperthermia Neg Hx   . Pseudochol deficiency Neg Hx     History  Substance Use Topics  . Smoking status: Never Smoker   . Smokeless tobacco: Not on file  . Alcohol Use: No      Review of Systems All pertinent positives and negatives reviewed  in the history of present illness  Allergies  Tape; Erythromycin; Sulfa antibiotics; and Tetracyclines & related  Home Medications   Current Outpatient Rx  Name Route Sig Dispense Refill  . VITAMIN C 500 MG PO TABS Oral Take 1 tablet (500 mg total) by mouth daily. 30 tablet   . ASPIRIN 325 MG PO TABS Oral Take 325 mg by mouth daily.      Marland Kitchen CALCIUM CARBONATE ANTACID 500 MG PO CHEW Oral Chew 2 tablets by mouth daily. For indigestion    . METOPROLOL TARTRATE 25 MG PO TABS  TAKE 1 TABLET ONCE DAILY. 30 tablet 6  . OXYCODONE-ACETAMINOPHEN 5-325 MG PO TABS Oral Take 1 tablet by mouth Every 6 hours as needed. For pain.    Marland Kitchen POTASSIUM CHLORIDE CRYS ER 20 MEQ PO TBCR Oral Take 20 mEq by mouth daily.     Marland Kitchen NITROGLYCERIN 0.4 MG SL SUBL Sublingual Place 0.4 mg under the tongue every 5 (five) minutes as needed. For chest pain      BP 107/70  Pulse 60  Temp(Src) 97.6 F (36.4 C) (Oral)  Resp 16  SpO2 91%  Physical Exam  Constitutional: He is oriented to person, place, and time. He appears well-developed and  well-nourished. No distress.  HENT:  Head: Normocephalic and atraumatic.  Eyes: Pupils are equal, round, and reactive to light.  Neck: Normal range of motion. Neck supple.  Cardiovascular: Normal rate, regular rhythm and normal heart sounds.  Exam reveals no gallop and no friction rub.   No murmur heard. Pulmonary/Chest: Effort normal and breath sounds normal. He has no rales.  Abdominal: Soft. Bowel sounds are normal. He exhibits no distension.  Neurological: He is alert and oriented to person, place, and time. He displays normal reflexes. He exhibits normal muscle tone. Coordination normal.  Skin: Skin is warm and dry. No rash noted.    ED Course  Procedures (including critical care time)  Labs Reviewed  BASIC METABOLIC PANEL - Abnormal; Notable for the following:    Glucose, Bld 127 (*)    All other components within normal limits  URINALYSIS, ROUTINE W REFLEX MICROSCOPIC -  Abnormal; Notable for the following:    Hgb urine dipstick TRACE (*)    All other components within normal limits  CSF CELL COUNT WITH DIFFERENTIAL - Abnormal; Notable for the following:    RBC Count, CSF 29 (*)    All other components within normal limits  CSF CELL COUNT WITH DIFFERENTIAL - Abnormal; Notable for the following:    RBC Count, CSF 1 (*)    All other components within normal limits  CBC  PROTEIN AND GLUCOSE, CSF  URINE MICROSCOPIC-ADD ON  GRAM STAIN  CSF CULTURE   Patient still having headache.  Given more IV fluids and pain control here.  Patient has no signs of meningitis based on his CSF evaluation or bleeding.  Patient will be monitored further significant his pain under better control.  Patient is admitted in 2010 and for similar type headache and workup.       MDM  Headaches        Carlyle Dolly, PA-C 09/15/11 1616

## 2011-09-15 NOTE — ED Notes (Signed)
Pt reports no improvement or worsening in pain. Sat up to semi fowlers. Tolerated well. No acute changes in status. No acute distress is noted.  Updated on plan of care for consult.

## 2011-09-15 NOTE — Consult Note (Signed)
Reason for Consult:Headache  Referring Physician: Adriana Simas  CC: Right sided headache  HPI: [name] is an 77 y.o. male who reports that on New Year's Eve had acute onset of nausea then the development of a right sided headache that he reports has been intermittent since that time.  It has not resolved completely but does improve on its own at times.  Can not relate to any particular activity or position.  Describes the headache as pounding and located at the right occipital ridge and radiating to the top of the head.  Rates the headache at a 7/10.  Has had associated nausea but no vomiting, photophobia or phonophobia.  In work up has had a head CT that is unremarkable.  LP has been performed as well and is unremarkable.  Has received steroids, narcotics and antiemetics without any improvement in his pain.    Patient had a similar episode about 3 years ago for which no cause was found.    Past Medical History  Diagnosis Date  . Asthma     only if develops cold  . Recurrent upper respiratory infection (URI) 03/2011  . Kidney stone on left side   . Arthritis   . PVC (premature ventricular contraction)     Past Surgical History  Procedure Date  . Cardiac catheterization     july 2010-minor irregl  . Kidney stone surgery 2011    R  . Back surgery     x 4  . Total hip arthroplasty     Right  . Total hip revision     Right  . Shoulder arthroscopy     Left  . Wrist surgery     bilateral, plates  . Knee arthroscopy     Left  . Cholecystectomy   . Appendectomy   . Eye surgery     bilat cataract  . Incision and drainage hip 07/18/2011    Procedure: IRRIGATION AND DEBRIDEMENT HIP;  Surgeon: Valeria Batman, MD;  Location: [facility] OR;  Service: Orthopedics;  Laterality: Right;  RIGHT HIP EXPLORATION, EXCISION OF DEEP BURSAL SAC    Family History  Problem Relation Age of Onset  . Heart disease Mother   . Heart attack Mother   . Anesthesia problems Neg Hx   . Hypotension Neg Hx   .  Malignant hyperthermia Neg Hx   . Pseudochol deficiency Neg Hx     Social History:  reports that he has never smoked. He does not have any smokeless tobacco history on file. He reports that he does not drink alcohol or use illicit drugs.  Allergies  Allergen Reactions  . Tape Other (See Comments)    Adhesive tape blisters  . Erythromycin Rash and Other (See Comments)    unknown  . Sulfa Antibiotics Rash  . Tetracyclines & Related Rash    Medications: I have reviewed the patient's current medications. Prior to Admission:  Vitamin C, ASA, Tums, Lopressor, Percocet, K-Dur, Nitroglycerin  ROS: History obtained from the patient  General ROS: negative for - chills, fatigue, fever, night sweats, weight gain or weight loss Psychological ROS: negative for - behavioral disorder, hallucinations, memory difficulties, mood swings or suicidal ideation Ophthalmic ROS: negative for - blurry vision, double vision, eye pain or loss of vision ENT ROS: negative for - epistaxis, nasal discharge, oral lesions, sore throat, tinnitus or vertigo Allergy and Immunology ROS: negative for - hives or itchy/watery eyes Hematological and Lymphatic ROS: negative for - bleeding problems, bruising or swollen lymph  nodes Endocrine ROS: negative for - galactorrhea, hair pattern changes, polydipsia/polyuria or temperature intolerance Respiratory ROS: negative for - cough, hemoptysis, shortness of breath or wheezing Cardiovascular ROS: negative for - chest pain, dyspnea on exertion, edema or irregular heartbeat Gastrointestinal ROS: nausea Genito-Urinary ROS: negative for - dysuria, hematuria, incontinence or urinary frequency/urgency Musculoskeletal ROS: back pain Neurological ROS: as noted in HPI Dermatological ROS: negative for rash and skin lesion changes Physical Examination: Blood pressure 139/67, pulse 64, temperature 97.6 F (36.4 C), temperature source Oral, resp. rate 15, SpO2 92.00%.  HEENT:   Discomfort on palpation at the right occipital ridge.    Neurologic Examination Mental Status: Alert, oriented, thought content appropriate.  Speech fluent without evidence of aphasia.  Able to follow 3 step commands without difficulty. Cranial Nerves: II: visual fields grossly normal, pupils equal, round, reactive to light and accommodation III,IV, VI: ptosis not present, extra-ocular motions intact bilaterally V,VII: smile symmetric, facial light touch sensation normal bilaterally VIII: hearing normal bilaterally IX,X: gag reflex present XI: trapezius strength/neck flexion strength normal bilaterally XII: tongue strength normal  Motor: Right : Upper extremity   5/5    Left:     Upper extremity   5/5  Lower extremity   5/5     Lower extremity   5/5 Tone and bulk:normal tone throughout; no atrophy noted Sensory: Pinprick and light touch intact throughout, bilaterally Deep Tendon Reflexes: 2+ in the upper extremities, absent at the knees and 1+ at the ankles Plantars: Right: mute   Left: mute Cerebellar: normal finger-to-nose and normal heel-to-shin test   Results for orders placed during the hospital encounter of 09/15/11 (from the past 48 hour(s))  BASIC METABOLIC PANEL     Status: Abnormal   Collection Time   09/15/11 11:42 AM      Component Value Range Comment   Sodium 140  135 - 145 (mEq/L)    Potassium 4.0  3.5 - 5.1 (mEq/L)    Chloride 105  96 - 112 (mEq/L)    CO2 25  19 - 32 (mEq/L)    Glucose, Bld 127 (*) 70 - 99 (mg/dL)    BUN 10  6 - 23 (mg/dL)    Creatinine, Ser 6.96  0.50 - 1.35 (mg/dL)    Calcium 9.2  8.4 - 10.5 (mg/dL)    GFR calc non Af Amer >90  >90 (mL/min)    GFR calc Af Amer >90  >90 (mL/min)   CBC     Status: Normal   Collection Time   09/15/11 11:42 AM      Component Value Range Comment   WBC 7.1  4.0 - 10.5 (K/uL)    RBC 4.60  4.22 - 5.81 (MIL/uL)    Hemoglobin 13.9  13.0 - 17.0 (g/dL)    HCT 29.5  28.4 - 13.2 (%)    MCV 87.8  78.0 - 100.0 (fL)     MCH 30.2  26.0 - 34.0 (pg)    MCHC 34.4  30.0 - 36.0 (g/dL)    RDW 44.0  10.2 - 72.5 (%)    Platelets 215  150 - 400 (K/uL)   URINALYSIS, ROUTINE W REFLEX MICROSCOPIC     Status: Abnormal   Collection Time   09/15/11 12:18 PM      Component Value Range Comment   Color, Urine YELLOW  YELLOW     APPearance CLEAR  CLEAR     Specific Gravity, Urine 1.006  1.005 - 1.030     pH 7.0  5.0 - 8.0     Glucose, UA NEGATIVE  NEGATIVE (mg/dL)    Hgb urine dipstick TRACE (*) NEGATIVE     Bilirubin Urine NEGATIVE  NEGATIVE     Ketones, ur NEGATIVE  NEGATIVE (mg/dL)    Protein, ur NEGATIVE  NEGATIVE (mg/dL)    Urobilinogen, UA 1.0  0.0 - 1.0 (mg/dL)    Nitrite NEGATIVE  NEGATIVE     Leukocytes, UA NEGATIVE  NEGATIVE    URINE MICROSCOPIC-ADD ON     Status: Normal   Collection Time   09/15/11 12:18 PM      Component Value Range Comment   Squamous Epithelial / LPF RARE  RARE     RBC / HPF 0-2  <3 (RBC/hpf)   CSF CELL COUNT WITH DIFFERENTIAL     Status: Abnormal   Collection Time   09/15/11  2:20 PM      Component Value Range Comment   Tube # 4      Color, CSF COLORLESS  COLORLESS     Appearance, CSF CLEAR  CLEAR     Supernatant NOT INDICATED      RBC Count, CSF 1 (*) 0 (/cu mm)    WBC, CSF 2  0 - 5 (/cu mm)    Lymphs, CSF FEW  40 - 80 (%)    Monocyte-Macrophage-Spinal Fluid OCCASIONAL  15 - 45 (%)    Other Cells, CSF TOO FEW TO COUNT, SMEAR AVAILABLE FOR REVIEW     CSF CELL COUNT WITH DIFFERENTIAL     Status: Abnormal   Collection Time   09/15/11  2:30 PM      Component Value Range Comment   Tube # 1      Color, CSF COLORLESS  COLORLESS     Appearance, CSF CLEAR  CLEAR     Supernatant NOT INDICATED      RBC Count, CSF 29 (*) 0 (/cu mm)    WBC, CSF 0  0 - 5 (/cu mm)    Segmented Neutrophils-CSF RARE  0 - 6 (%)    Lymphs, CSF FEW  40 - 80 (%)    Monocyte-Macrophage-Spinal Fluid RARE  15 - 45 (%)    Other Cells, CSF TOO FEW TO COUNT, SMEAR AVAILABLE FOR REVIEW     PROTEIN AND GLUCOSE, CSF      Status: Normal   Collection Time   09/15/11  2:30 PM      Component Value Range Comment   Glucose, CSF 70  43 - 76 (mg/dL)    Total  Protein, CSF 27  15 - 45 (mg/dL)   GRAM STAIN     Status: Normal   Collection Time   09/15/11  2:30 PM      Component Value Range Comment   Specimen Description CSF      Special Requests NONE      Gram Stain        Value: CYTOSPIN SLIDE     WBC PRESENT, PREDOMINANTLY MONONUCLEAR     NO ORGANISMS SEEN   Report Status 09/15/2011 FINAL       Recent Results (from the past 240 hour(s))  GRAM STAIN     Status: Normal   Collection Time   09/15/11  2:30 PM      Component Value Range Status Comment   Specimen Description CSF   Final    Special Requests NONE   Final    Gram Stain     Final    Value: CYTOSPIN SLIDE  WBC PRESENT, PREDOMINANTLY MONONUCLEAR     NO ORGANISMS SEEN   Report Status 09/15/2011 FINAL   Final     Dg Fluoro Guide Ndl Plc/bx  09/15/2011  *RADIOLOGY REPORT*  Clinical Data: Headache.  FLUORO GUIDED NEEDLE PLACEMENT  Comparison: 09/13/2011 head CT.  Fluoroscopic time: 0.15 minutes.  Findings: The procedure and associated risks were reviewed with the patient.  Questions were answered.  Written as well as oral witnessed consent was obtained.  Under fluoroscopic guidance and aseptic technique, an L3-4 lumbar puncture was performed with a single pass of a 20-gauge spinal needle.  Opening pressure 18 cm of H2O  8 ml of clear cerebrospinal fluid collected and sent for labs as per request.  No complications.  Postprocedure instructions reviewed in detail to patient.  IMPRESSION: Successful lumbar puncture as noted above.  Original Report Authenticated By: Fuller Canada, M.D.     Assessment/Plan:  Patient Active Hospital Problem List: Headache   Assessment: Patient with prolonged headache.  No causes such as ICH or meningitis found in work up.  Exam and history suggest occipital neuralgia versus migraine.   Plan:  1.  Kenalog/lidocaine  injection at the right occipital ridge.        2.  If no improvement in symptoms after injection would recommend a MRI of the brain  3.  If further treatment for pain necessary after injection would attempt DHE IV with compazine and po steroid taper to be continued on an outpatient basis.  Thana Farr, MD [facility] [phone] 09/15/2011, 8:42 PM   Addendum: Occipital injection procedure (risks and benefits) explained to patient and family.  Verbal consent given.  2cc lidocaine/2cc Kenalog injected at the right occipital ridge without complications.  Patient had initial complaints of nausea and was given 12.5mg  of Phenergan.  After about 15 minutes patient reported that his headche had improved to a 3/10.  With this degree of improvement it was decided that an MRI did not need to be performed tonight but could be performed on an outpatient basis if his headache returned to its previous severity.  Neurological exam is unremarkable.  Will defer DHE at this time as well.  D/C Plan:  1. Prednisone 50mg  X 2 days then decrease by 10mg  daily until off.  Patient advised not to take on an empty stomach.    2. Heat to area of injection once he returns to home for about 30 minutes  3. Motrin 400 mg tonight  4. Continue follow up with PCP.  Physician at that time may also determine the need for an outpatient neurological evaluation.    Raelyn Number Thad Ranger, MD 09/15/11, 10:45PM

## 2011-09-15 NOTE — ED Notes (Signed)
Dinner tray ordered. Ok per EDP  

## 2011-09-15 NOTE — ED Notes (Signed)
Neurology at bedside to inject kenalog/lidocaine for headache. Post injection, pt reports headache now 6/10. Waiting for phenergan from pharmacy, pt reports nausea upon injection.

## 2011-09-15 NOTE — ED Notes (Signed)
Headache now 3/10 and nausea improved.

## 2011-09-15 NOTE — ED Notes (Signed)
Neurology back to bed side. Pt somewhat improved. Sipping gingerale.

## 2011-09-15 NOTE — ED Notes (Signed)
Patient persents to ed via [facility] from urgent care  With c/o headache and stiff neck and nausea onset 09/05/11, states he has been at urgent care on sun. Wed and today. Denies  Blurred or double vision. Denies weakness. States he had viral meningitis  Several years ago. Currently alert oriented.

## 2011-09-15 NOTE — ED Notes (Signed)
Received patient from [facility]. Pt noted alert and oriented and in no acute distress. Neuro intact. Still c/o headache 7/10, but denies needs for pain meds at this time. NS bolus infusing into benign iv site rt ac. Skin is warm and dry. Resp are unlabored. Pt continue to lay flat post LP. Family at bedside.

## 2011-09-15 NOTE — ED Notes (Signed)
Ambulatory to check out stable and in no acute distress.

## 2011-09-15 NOTE — ED Provider Notes (Signed)
Patient continues to have headache despite multiple medications.  Patient states headache has been present for 12 days.  Previous migraine history, no migraines for many years--current headache is not similar to prior episodes.  Neg head CT, neg LP.  Patient admitted in 2010 for similar presentation.  Discussed with Dr.Reynolds, will see in ED.  10:48 PM Patient seen by Dr. Melton Krebs received occipital nerve injection of kenalog and lidocaine with significant improvement in pain.  Patient will be discharged home with prednisone taper and follow-up with his PCP.  George Norman, NP 09/16/11 0005

## 2011-09-15 NOTE — Procedures (Signed)
L3-4 LP with single pass 20g.  OP 18 cm H2O.  8 cc clear CSF collected.  No complicatons.

## 2011-09-16 NOTE — ED Provider Notes (Signed)
Medical screening examination/treatment/procedure(s) were performed by non-physician practitioner and as supervising physician I was immediately available for consultation/collaboration.  Flint Melter, MD 09/16/11 1028

## 2011-09-16 NOTE — ED Provider Notes (Signed)
Medical screening examination/treatment/procedure(s) were conducted as a shared visit with non-physician practitioner(s) and myself.  I personally evaluated the patient during the encounter.  No true meningeal signs. 4th visit to Dr.  Stann Mainland do lumbar puncture to rule out meningitis or bleed.  Donnetta Hutching, MD 09/16/11 [phone]

## 2011-10-02 DIAGNOSIS — M25559 Pain in unspecified hip: Secondary | ICD-10-CM | POA: Diagnosis not present

## 2011-10-02 DIAGNOSIS — M76899 Other specified enthesopathies of unspecified lower limb, excluding foot: Secondary | ICD-10-CM | POA: Diagnosis not present

## 2011-11-04 DIAGNOSIS — M5137 Other intervertebral disc degeneration, lumbosacral region: Secondary | ICD-10-CM | POA: Diagnosis not present

## 2011-11-06 DIAGNOSIS — M5430 Sciatica, unspecified side: Secondary | ICD-10-CM | POA: Diagnosis not present

## 2011-11-16 DIAGNOSIS — M47817 Spondylosis without myelopathy or radiculopathy, lumbosacral region: Secondary | ICD-10-CM | POA: Diagnosis not present

## 2011-11-16 DIAGNOSIS — M545 Low back pain, unspecified: Secondary | ICD-10-CM | POA: Diagnosis not present

## 2011-11-22 DIAGNOSIS — M545 Low back pain, unspecified: Secondary | ICD-10-CM | POA: Diagnosis not present

## 2011-11-22 DIAGNOSIS — M47817 Spondylosis without myelopathy or radiculopathy, lumbosacral region: Secondary | ICD-10-CM | POA: Diagnosis not present

## 2011-11-24 DIAGNOSIS — L578 Other skin changes due to chronic exposure to nonionizing radiation: Secondary | ICD-10-CM | POA: Diagnosis not present

## 2011-11-24 DIAGNOSIS — L820 Inflamed seborrheic keratosis: Secondary | ICD-10-CM | POA: Diagnosis not present

## 2011-11-24 DIAGNOSIS — L821 Other seborrheic keratosis: Secondary | ICD-10-CM | POA: Diagnosis not present

## 2011-11-27 DIAGNOSIS — M76899 Other specified enthesopathies of unspecified lower limb, excluding foot: Secondary | ICD-10-CM | POA: Diagnosis not present

## 2011-11-30 ENCOUNTER — Other Ambulatory Visit (INDEPENDENT_AMBULATORY_CARE_PROVIDER_SITE_OTHER): Payer: Medicare Other

## 2011-11-30 DIAGNOSIS — E7800 Pure hypercholesterolemia, unspecified: Secondary | ICD-10-CM | POA: Diagnosis not present

## 2011-11-30 DIAGNOSIS — I119 Hypertensive heart disease without heart failure: Secondary | ICD-10-CM | POA: Diagnosis not present

## 2011-11-30 LAB — BASIC METABOLIC PANEL
BUN: 17 mg/dL (ref 6–23)
GFR: 111.48 mL/min (ref >60.00)
Glucose, Bld: 110 mg/dL — ABNORMAL HIGH (ref 70–99)
Potassium: 3.8 mEq/L (ref 3.5–5.1)

## 2011-11-30 LAB — HEPATIC FUNCTION PANEL
ALT: 17 U/L (ref 0–53)
AST: 18 U/L (ref 0–37)
Alkaline Phosphatase: 56 U/L (ref 39–117)
Bilirubin, Direct: 0.2 mg/dL (ref 0.0–0.3)
Total Bilirubin: 0.9 mg/dL (ref 0.3–1.2)

## 2011-11-30 LAB — LIPID PANEL: VLDL: 23.8 mg/dL (ref 0.0–40.0)

## 2011-11-30 NOTE — Progress Notes (Signed)
Quick Note:  Please report to patient. The recent labs are stable. Continue same medication and careful diet. BS better. Cholesterol better. ______

## 2011-12-04 ENCOUNTER — Ambulatory Visit: Payer: Medicare Other | Attending: Orthopaedic Surgery | Admitting: Physical Therapy

## 2011-12-04 DIAGNOSIS — R262 Difficulty in walking, not elsewhere classified: Secondary | ICD-10-CM | POA: Insufficient documentation

## 2011-12-04 DIAGNOSIS — IMO0001 Reserved for inherently not codable concepts without codable children: Secondary | ICD-10-CM | POA: Insufficient documentation

## 2011-12-04 DIAGNOSIS — M25559 Pain in unspecified hip: Secondary | ICD-10-CM | POA: Diagnosis not present

## 2011-12-07 ENCOUNTER — Encounter: Payer: Self-pay | Admitting: Cardiology

## 2011-12-07 ENCOUNTER — Ambulatory Visit (INDEPENDENT_AMBULATORY_CARE_PROVIDER_SITE_OTHER): Payer: Medicare Other | Admitting: Cardiology

## 2011-12-07 VITALS — BP 130/82 | HR 60 | Ht 76.0 in | Wt 225.0 lb

## 2011-12-07 DIAGNOSIS — M7071 Other bursitis of hip, right hip: Secondary | ICD-10-CM

## 2011-12-07 DIAGNOSIS — R002 Palpitations: Secondary | ICD-10-CM | POA: Diagnosis not present

## 2011-12-07 DIAGNOSIS — I119 Hypertensive heart disease without heart failure: Secondary | ICD-10-CM | POA: Diagnosis not present

## 2011-12-07 DIAGNOSIS — E7800 Pure hypercholesterolemia, unspecified: Secondary | ICD-10-CM | POA: Diagnosis not present

## 2011-12-07 DIAGNOSIS — M76899 Other specified enthesopathies of unspecified lower limb, excluding foot: Secondary | ICD-10-CM

## 2011-12-07 DIAGNOSIS — E785 Hyperlipidemia, unspecified: Secondary | ICD-10-CM

## 2011-12-07 DIAGNOSIS — M1990 Unspecified osteoarthritis, unspecified site: Secondary | ICD-10-CM

## 2011-12-07 NOTE — Patient Instructions (Signed)
Your physician wants you to follow-up in: 4 months with Dr. Patty Sermons.  You will receive a reminder letter in the mail two months in advance. If you don't receive a letter, please call our office to schedule the  follow-up appointment.  Your physician recommends that you return for lab work in: 4 months.  Lipids, Liver, Bmet.

## 2011-12-07 NOTE — Assessment & Plan Note (Signed)
The patient has a history of high blood pressure.  He has been doing well on current therapy of metoprolol.  No headaches or dizzy spells.  No symptoms of congestive heart failure.

## 2011-12-07 NOTE — Progress Notes (Signed)
Lenard Forth Date of Birth:  07/03/1935 [facility] [address] [place], [place]  [address] [phone]         Fax   [phone]  History of Present Illness: This pleasant 77 year old gentleman is seen for 4 month followup office visit.  He has a past history of atypical chest pain.  He has had several prior cardiac catheterizations which have been normal.  He was found to be having chest pain secondary to chronic cholecystitis and he underwent eventual laparoscopic cholecystectomy in January 2011 and he had subsequent ERCP for removal of common duct stone had no recent symptoms referable to his liver or common bile duct.  Current Outpatient Prescriptions  Medication Sig Dispense Refill  . Ascorbic Acid (VITAMIN C) 500 MG tablet Take 1 tablet (500 mg total) by mouth daily.  30 tablet    . aspirin 325 MG tablet Take 325 mg by mouth daily.        . calcium carbonate (TUMS - DOSED IN MG ELEMENTAL CALCIUM) 500 MG chewable tablet Chew 2 tablets by mouth daily. For indigestion      . metoprolol tartrate (LOPRESSOR) 25 MG tablet TAKE 1 TABLET ONCE DAILY.  30 tablet  6  . nitroGLYCERIN (NITROSTAT) 0.4 MG SL tablet Place 0.4 mg under the tongue every 5 (five) minutes as needed. For chest pain      . potassium chloride SA (K-DUR) 20 MEQ tablet Take 20 mEq by mouth daily.       . diclofenac (VOLTAREN) 50 MG EC tablet as directed.      Marland Kitchen oxyCODONE-acetaminophen (PERCOCET) 5-325 MG per tablet Take 1 tablet by mouth Every 6 hours as needed. For pain.        Allergies  Allergen Reactions  . Tape Other (See Comments)    Adhesive tape blisters  . Erythromycin Rash and Other (See Comments)    unknown  . Sulfa Antibiotics Rash  . Tetracyclines & Related Rash    Patient Active Problem List  Diagnoses  . Benign hypertensive heart disease without heart failure  . BPH (benign prostatic hyperplasia)  . Osteoarthritis  . Dyslipidemia  . Status post cholecystectomy  .  Trochanteric Bursitis of right hip    History  Smoking status  . Never Smoker   Smokeless tobacco  . Not on file    History  Alcohol Use No    Family History  Problem Relation Age of Onset  . Heart disease Mother   . Heart attack Mother   . Anesthesia problems Neg Hx   . Hypotension Neg Hx   . Malignant hyperthermia Neg Hx   . Pseudochol deficiency Neg Hx     Review of Systems: Constitutional: no fever chills diaphoresis or fatigue or change in weight.  Head and neck: no hearing loss, no epistaxis, no photophobia or visual disturbance. Respiratory: No cough, shortness of breath or wheezing. Cardiovascular: No chest pain peripheral edema, palpitations. Gastrointestinal: No abdominal distention, no abdominal pain, no change in bowel habits hematochezia or melena. Genitourinary: No dysuria, no frequency, no urgency, no nocturia. Musculoskeletal:No arthralgias, no back pain, no gait disturbance or myalgias. Neurological: No dizziness, no headaches, no numbness, no seizures, no syncope, no weakness, no tremors. Hematologic: No lymphadenopathy, no easy bruising. Psychiatric: No confusion, no hallucinations, no sleep disturbance.    Physical Exam: Filed Vitals:   12/07/11 0907  BP: 130/82  Pulse: 60   the general appearance reveals a well-developed well-nourished gentleman in no distress.The head  and neck exam reveals pupils equal and reactive.  Extraocular movements are full.  There is no scleral icterus.  The mouth and pharynx are normal.  The neck is supple.  The carotids reveal no bruits.  The jugular venous pressure is normal.  The  thyroid is not enlarged.  There is no lymphadenopathy.  The chest is clear to percussion and auscultation.  There are no rales or rhonchi.  Expansion of the chest is symmetrical.  The precordium is quiet.  The first heart sound is normal.  The second heart sound is physiologically split.  There is no murmur gallop rub or click.  There is no  abnormal lift or heave.  The abdomen is soft and nontender.  The bowel sounds are normal.  The liver and spleen are not enlarged.  There are no abdominal masses.  There are no abdominal bruits.  Extremities reveal good pedal pulses.  There is no phlebitis or edema.  There is no cyanosis or clubbing.  Strength is normal and symmetrical in all extremities.  There is no lateralizing weakness.  There are no sensory deficits.  The skin is warm and dry.  There is no rash.     Assessment / Plan: Continue same medication.  Continue careful diet.  Recheck in 4 months for followup office visit lipid panel hepatic function panel and basal metabolic panel.

## 2011-12-07 NOTE — Assessment & Plan Note (Signed)
The patient has a history of dyslipidemia.  He is treating this at the present time with diet and exercise.  He is not on any statin therapy.  Lipids are satisfactory

## 2011-12-07 NOTE — Assessment & Plan Note (Signed)
The patient has had previous right hip surgery.  The right hip is working well the patient does have residual trochanteric bursitis which is causing him some discomfort.  The patient is hopeful that the hip discomfort we'll subsided by the time he is ready to start playing golf

## 2011-12-08 ENCOUNTER — Ambulatory Visit: Payer: Medicare Other | Admitting: Physical Therapy

## 2011-12-11 DIAGNOSIS — M76899 Other specified enthesopathies of unspecified lower limb, excluding foot: Secondary | ICD-10-CM | POA: Diagnosis not present

## 2011-12-11 DIAGNOSIS — Z471 Aftercare following joint replacement surgery: Secondary | ICD-10-CM | POA: Diagnosis not present

## 2011-12-11 DIAGNOSIS — Z96649 Presence of unspecified artificial hip joint: Secondary | ICD-10-CM | POA: Diagnosis not present

## 2011-12-12 ENCOUNTER — Ambulatory Visit: Payer: Medicare Other | Admitting: Physical Therapy

## 2011-12-13 DIAGNOSIS — M545 Low back pain, unspecified: Secondary | ICD-10-CM | POA: Diagnosis not present

## 2011-12-13 DIAGNOSIS — IMO0001 Reserved for inherently not codable concepts without codable children: Secondary | ICD-10-CM | POA: Diagnosis not present

## 2011-12-14 ENCOUNTER — Ambulatory Visit: Payer: Medicare Other | Admitting: Physical Therapy

## 2011-12-18 ENCOUNTER — Ambulatory Visit: Payer: Medicare Other | Admitting: Physical Therapy

## 2011-12-22 ENCOUNTER — Ambulatory Visit: Payer: Medicare Other | Admitting: Physical Therapy

## 2011-12-25 ENCOUNTER — Ambulatory Visit: Payer: Medicare Other | Admitting: Physical Therapy

## 2011-12-28 ENCOUNTER — Ambulatory Visit: Payer: Medicare Other | Admitting: Physical Therapy

## 2012-01-02 ENCOUNTER — Ambulatory Visit: Payer: Medicare Other | Admitting: Physical Therapy

## 2012-01-04 ENCOUNTER — Ambulatory Visit: Payer: Medicare Other | Attending: Orthopaedic Surgery | Admitting: Physical Therapy

## 2012-01-04 DIAGNOSIS — IMO0001 Reserved for inherently not codable concepts without codable children: Secondary | ICD-10-CM | POA: Insufficient documentation

## 2012-01-04 DIAGNOSIS — M25559 Pain in unspecified hip: Secondary | ICD-10-CM | POA: Insufficient documentation

## 2012-01-04 DIAGNOSIS — R262 Difficulty in walking, not elsewhere classified: Secondary | ICD-10-CM | POA: Insufficient documentation

## 2012-03-20 DIAGNOSIS — B3749 Other urogenital candidiasis: Secondary | ICD-10-CM | POA: Diagnosis not present

## 2012-03-20 DIAGNOSIS — N138 Other obstructive and reflux uropathy: Secondary | ICD-10-CM | POA: Diagnosis not present

## 2012-03-20 DIAGNOSIS — N401 Enlarged prostate with lower urinary tract symptoms: Secondary | ICD-10-CM | POA: Diagnosis not present

## 2012-03-25 ENCOUNTER — Other Ambulatory Visit: Payer: Self-pay | Admitting: Cardiology

## 2012-03-25 NOTE — Telephone Encounter (Signed)
Refilled metoprolol 

## 2012-04-11 ENCOUNTER — Other Ambulatory Visit: Payer: Medicare Other | Admitting: *Deleted

## 2012-04-11 ENCOUNTER — Ambulatory Visit: Payer: Medicare Other | Admitting: Cardiology

## 2012-04-12 ENCOUNTER — Ambulatory Visit (INDEPENDENT_AMBULATORY_CARE_PROVIDER_SITE_OTHER): Payer: Medicare Other | Admitting: *Deleted

## 2012-04-12 DIAGNOSIS — I119 Hypertensive heart disease without heart failure: Secondary | ICD-10-CM

## 2012-04-12 DIAGNOSIS — E785 Hyperlipidemia, unspecified: Secondary | ICD-10-CM | POA: Diagnosis not present

## 2012-04-12 LAB — BASIC METABOLIC PANEL
Chloride: 104 mEq/L (ref 96–112)
Creatinine, Ser: 0.8 mg/dL (ref 0.4–1.5)
GFR: 106.31 mL/min (ref >60.00)
Potassium: 4 mEq/L (ref 3.5–5.1)

## 2012-04-12 LAB — HEPATIC FUNCTION PANEL
ALT: 19 U/L (ref 0–53)
Bilirubin, Direct: 0.2 mg/dL (ref 0.0–0.3)
Total Bilirubin: 1.5 mg/dL — ABNORMAL HIGH (ref 0.3–1.2)

## 2012-04-12 LAB — LIPID PANEL
LDL Cholesterol: 99 mg/dL (ref 0–99)
Total CHOL/HDL Ratio: 3
VLDL: 15.2 mg/dL (ref 0.0–40.0)

## 2012-04-12 NOTE — Progress Notes (Signed)
Quick Note:  Please make copy of labs for patient visit. ______ 

## 2012-04-15 ENCOUNTER — Other Ambulatory Visit: Payer: Medicare Other

## 2012-04-16 ENCOUNTER — Encounter: Payer: Self-pay | Admitting: Cardiology

## 2012-04-16 ENCOUNTER — Ambulatory Visit (INDEPENDENT_AMBULATORY_CARE_PROVIDER_SITE_OTHER): Payer: Medicare Other | Admitting: Cardiology

## 2012-04-16 ENCOUNTER — Other Ambulatory Visit: Payer: Medicare Other

## 2012-04-16 VITALS — BP 124/68 | HR 53 | Ht 76.0 in | Wt 227.0 lb

## 2012-04-16 DIAGNOSIS — I119 Hypertensive heart disease without heart failure: Secondary | ICD-10-CM | POA: Diagnosis not present

## 2012-04-16 DIAGNOSIS — E785 Hyperlipidemia, unspecified: Secondary | ICD-10-CM | POA: Diagnosis not present

## 2012-04-16 DIAGNOSIS — M76899 Other specified enthesopathies of unspecified lower limb, excluding foot: Secondary | ICD-10-CM

## 2012-04-16 DIAGNOSIS — M7071 Other bursitis of hip, right hip: Secondary | ICD-10-CM

## 2012-04-16 NOTE — Assessment & Plan Note (Signed)
Patient has a history of borderline hypercholesterolemia.  He is trying to monitor his diet.  They did a lot of traveling this summer and he was off his diet.  At the present time he is not require any statin therapy

## 2012-04-16 NOTE — Assessment & Plan Note (Signed)
His right hip pain has been only mildly uncomfortable and has not slowed him down from playing golf several times a week.  He is not having any claudication.

## 2012-04-16 NOTE — Patient Instructions (Addendum)
Your physician recommends that you continue on your current medications as directed. Please refer to the Current Medication list given to you today.  Your physician recommends that you schedule a follow-up appointment in: 4 months with fasting labs (lp/bmet/hfp)  

## 2012-04-16 NOTE — Progress Notes (Signed)
[name] Date of Birth:  [birth year] [facility] [address] [place], [place]  [address] [phone]         Fax   [phone]  History of Present Illness: This pleasant 77 year old gentleman is seen for a followup visit.  He has a history of atypical chest pain.  He has had several prior cardiac catheterizations which have been normal.  He was found to have chronic cholecystitis subsequently and underwent laparoscopic cholecystectomy in January 2011 and had a subsequent ERCP for removal of common duct stone.  Since then he has been feeling well.  Current Outpatient Prescriptions  Medication Sig Dispense Refill  . Ascorbic Acid (VITAMIN C) 500 MG tablet Take 1 tablet (500 mg total) by mouth daily.  30 tablet    . aspirin 325 MG tablet Take 325 mg by mouth daily.        . calcium carbonate (TUMS - DOSED IN MG ELEMENTAL CALCIUM) 500 MG chewable tablet Chew 2 tablets by mouth daily. For indigestion      . metoprolol tartrate (LOPRESSOR) 25 MG tablet TAKE 1 TABLET ONCE DAILY.  30 tablet  11  . potassium chloride SA (K-DUR) 20 MEQ tablet Take 20 mEq by mouth daily.         Allergies  Allergen Reactions  . Tape Other (See Comments)    Adhesive tape blisters  . Erythromycin Rash and Other (See Comments)    unknown  . Sulfa Antibiotics Rash  . Tetracyclines & Related Rash    Patient Active Problem List  Diagnosis  . Benign hypertensive heart disease without heart failure  . BPH (benign prostatic hyperplasia)  . Osteoarthritis  . Dyslipidemia  . Status post cholecystectomy  . Trochanteric Bursitis of right hip    History  Smoking status  . Never Smoker   Smokeless tobacco  . Not on file    History  Alcohol Use No    Family History  Problem Relation Age of Onset  . Heart disease Mother   . Heart attack Mother   . Anesthesia problems Neg Hx   . Hypotension Neg Hx   . Malignant hyperthermia Neg Hx   . Pseudochol deficiency Neg Hx      Review of Systems: Constitutional: no fever chills diaphoresis or fatigue or change in weight.  Head and neck: no hearing loss, no epistaxis, no photophobia or visual disturbance. Respiratory: No cough, shortness of breath or wheezing. Cardiovascular: No chest pain peripheral edema, palpitations. Gastrointestinal: No abdominal distention, no abdominal pain, no change in bowel habits hematochezia or melena. Genitourinary: No dysuria, no frequency, no urgency, no nocturia. Musculoskeletal:No arthralgias, no back pain, no gait disturbance or myalgias. Neurological: No dizziness, no headaches, no numbness, no seizures, no syncope, no weakness, no tremors. Hematologic: No lymphadenopathy, no easy bruising. Psychiatric: No confusion, no hallucinations, no sleep disturbance.    Physical Exam: Filed Vitals:   04/16/12 0935  BP: 124/68  Pulse: 53   general feels a large gentleman in no distress.The head and neck exam reveals pupils equal and reactive.  Extraocular movements are full.  There is no scleral icterus.  The mouth and pharynx are normal.  The neck is supple.  The carotids reveal no bruits.  The jugular venous pressure is normal.  The  thyroid is not enlarged.  There is no lymphadenopathy.  The chest is clear to percussion and auscultation.  There are no rales or rhonchi.  Expansion of the chest is symmetrical.  The  precordium is quiet.  The first heart sound is normal.  The second heart sound is physiologically split.  There is no murmur gallop rub or click.  There is no abnormal lift or heave.  The abdomen is soft and nontender.  The bowel sounds are normal.  The liver and spleen are not enlarged.  There are no abdominal masses.  There are no abdominal bruits.  Extremities reveal good pedal pulses.  There is no phlebitis or edema.  There is no cyanosis or clubbing.  Strength is normal and symmetrical in all extremities.  There is no lateralizing weakness.  There are no sensory deficits.   The skin is warm and dry.  There is no rash.     Assessment / Plan: Continue same medication.  Recheck in 4 months for followup office visit hepatic function panel and basal metabolic panel.

## 2012-04-16 NOTE — Assessment & Plan Note (Signed)
The patient has not been having a recent chest pain shortness of breath or palpitations.  His energy level is good.  He has been playing golf several times a week.  He has had no dizzy spells or syncope.

## 2012-05-28 ENCOUNTER — Other Ambulatory Visit: Payer: Self-pay | Admitting: Cardiology

## 2012-08-12 ENCOUNTER — Other Ambulatory Visit (INDEPENDENT_AMBULATORY_CARE_PROVIDER_SITE_OTHER): Payer: Medicare Other

## 2012-08-12 DIAGNOSIS — I119 Hypertensive heart disease without heart failure: Secondary | ICD-10-CM

## 2012-08-12 LAB — HEPATIC FUNCTION PANEL
ALT: 19 U/L (ref 0–53)
AST: 26 U/L (ref 0–37)
Bilirubin, Direct: 0.2 mg/dL (ref 0.0–0.3)
Total Bilirubin: 1.5 mg/dL — ABNORMAL HIGH (ref 0.3–1.2)

## 2012-08-12 LAB — LIPID PANEL
LDL Cholesterol: 114 mg/dL — ABNORMAL HIGH (ref 0–99)
Total CHOL/HDL Ratio: 4
Triglycerides: 114 mg/dL (ref 0.0–149.0)
VLDL: 22.8 mg/dL (ref 0.0–40.0)

## 2012-08-12 LAB — BASIC METABOLIC PANEL
BUN: 19 mg/dL (ref 6–23)
CO2: 26 mEq/L (ref 19–32)
Calcium: 8.9 mg/dL (ref 8.4–10.5)
GFR: 75.63 mL/min (ref >60.00)
Glucose, Bld: 120 mg/dL — ABNORMAL HIGH (ref 70–99)
Sodium: 138 mEq/L (ref 135–145)

## 2012-08-12 NOTE — Progress Notes (Signed)
Quick Note:  Please make copy of labs for patient visit. ______ 

## 2012-08-14 ENCOUNTER — Encounter: Payer: Self-pay | Admitting: Cardiology

## 2012-08-14 ENCOUNTER — Ambulatory Visit (INDEPENDENT_AMBULATORY_CARE_PROVIDER_SITE_OTHER): Payer: Medicare Other | Admitting: Cardiology

## 2012-08-14 VITALS — BP 144/76 | HR 49 | Ht 76.0 in | Wt 230.2 lb

## 2012-08-14 DIAGNOSIS — I119 Hypertensive heart disease without heart failure: Secondary | ICD-10-CM | POA: Diagnosis not present

## 2012-08-14 DIAGNOSIS — E785 Hyperlipidemia, unspecified: Secondary | ICD-10-CM | POA: Diagnosis not present

## 2012-08-14 DIAGNOSIS — R1013 Epigastric pain: Secondary | ICD-10-CM | POA: Diagnosis not present

## 2012-08-14 DIAGNOSIS — K3189 Other diseases of stomach and duodenum: Secondary | ICD-10-CM

## 2012-08-14 DIAGNOSIS — M1990 Unspecified osteoarthritis, unspecified site: Secondary | ICD-10-CM

## 2012-08-14 MED ORDER — OMEPRAZOLE 40 MG PO CPDR: 40.0000 mg | DELAYED_RELEASE_CAPSULE | Freq: Every day | ORAL | Status: DC

## 2012-08-14 NOTE — Progress Notes (Signed)
[name] Date of Birth:  1935/08/05 [facility] [address] [place], [place]  [address] [phone]         Fax   [phone]  History of Present Illness: This pleasant 77 year old gentleman is seen for a followup visit. He has a history of atypical chest pain. He has had several prior cardiac catheterizations which have been normal. He was found to have chronic cholecystitis subsequently and underwent laparoscopic cholecystectomy in January 2011 and had a subsequent ERCP for removal of common duct stone. Since then he has been feeling well.  Recently he has been experiencing some substernal "indigestion" again.  He has tried over-the-counter remedies such as TUMS or Rolaids without any improvement.  Sometimes the discomfort lasts all day.  It does not appear to be related to physical activity.  It does not appear to be related to the type of food he eats.  He is asking if he can start back on his omeprazole which he previously had taken successfully.   Current Outpatient Prescriptions  Medication Sig Dispense Refill  . Ascorbic Acid (VITAMIN C) 500 MG tablet Take 1 tablet (500 mg total) by mouth daily.  30 tablet    . aspirin 325 MG tablet Take 325 mg by mouth daily.        . calcium carbonate (TUMS - DOSED IN MG ELEMENTAL CALCIUM) 500 MG chewable tablet Chew 2 tablets by mouth daily. For indigestion      . K-DUR 20 MEQ tablet TAKE 1 TABLET ONCE DAILY.  30 tablet  12  . metoprolol tartrate (LOPRESSOR) 25 MG tablet TAKE 1 TABLET ONCE DAILY.  30 tablet  11    Allergies  Allergen Reactions  . Tape Other (See Comments)    Adhesive tape blisters  . Erythromycin Rash and Other (See Comments)    unknown  . Sulfa Antibiotics Rash  . Tetracyclines & Related Rash    Patient Active Problem List  Diagnosis  . Benign hypertensive heart disease without heart failure  . BPH (benign prostatic hyperplasia)  . Osteoarthritis  . Dyslipidemia  . Status post  cholecystectomy  . Trochanteric Bursitis of right hip  . Dyspepsia    History  Smoking status  . Never Smoker   Smokeless tobacco  . Not on file    History  Alcohol Use No    Family History  Problem Relation Age of Onset  . Heart disease Mother   . Heart attack Mother   . Anesthesia problems Neg Hx   . Hypotension Neg Hx   . Malignant hyperthermia Neg Hx   . Pseudochol deficiency Neg Hx     Review of Systems: Constitutional: no fever chills diaphoresis or fatigue or change in weight.  Head and neck: no hearing loss, no epistaxis, no photophobia or visual disturbance. Respiratory: No cough, shortness of breath or wheezing. Cardiovascular: No chest pain peripheral edema, palpitations. Gastrointestinal: No abdominal distention, no abdominal pain, no change in bowel habits hematochezia or melena. Genitourinary: No dysuria, no frequency, no urgency, no nocturia. Musculoskeletal:No arthralgias, no back pain, no gait disturbance or myalgias. Neurological: No dizziness, no headaches, no numbness, no seizures, no syncope, no weakness, no tremors. Hematologic: No lymphadenopathy, no easy bruising. Psychiatric: No confusion, no hallucinations, no sleep disturbance.    Physical Exam: Filed Vitals:   08/14/12 0942  BP: 144/76  Pulse: 49   the general appearance reveals a well-developed well-nourished gentleman in no distress.The head and neck exam reveals pupils equal and  reactive.  Extraocular movements are full.  There is no scleral icterus.  The mouth and pharynx are normal.  The neck is supple.  The carotids reveal no bruits.  The jugular venous pressure is normal.  The  thyroid is not enlarged.  There is no lymphadenopathy.  The chest is clear to percussion and auscultation.  There are no rales or rhonchi.  Expansion of the chest is symmetrical.  The precordium is quiet.  The first heart sound is normal.  The second heart sound is physiologically split.  There is no murmur gallop  rub or click.  There is no abnormal lift or heave.  The abdomen is soft and nontender.  The bowel sounds are normal.  The liver and spleen are not enlarged.  There are no abdominal masses.  There are no abdominal bruits.  Extremities reveal good pedal pulses.  There is no phlebitis or edema.  There is no cyanosis or clubbing.  Strength is normal and symmetrical in all extremities.  There is no lateralizing weakness.  There are no sensory deficits.  The skin is warm and dry.  There is no rash.  EKG shows sinus bradycardia and no ischemic changes  Assessment / Plan: Continue same medication.  Add back omeprazole 40 mg daily.  Recheck in 4 months for followup office visit and fasting lab work

## 2012-08-14 NOTE — Assessment & Plan Note (Signed)
His arthritis has been stable since last visit.  He remains on a 325 milligram aspirin daily

## 2012-08-14 NOTE — Addendum Note (Signed)
Addended by: Regis Bill B on: 08/14/2012 11:54 AM   Modules accepted: Orders

## 2012-08-14 NOTE — Assessment & Plan Note (Signed)
The patient has been having increasing dyspepsia and indigestion and substernal discomfort.  We will restart omeprazole 40 mg one daily.  If his symptoms do not respond, we will want him to see Dr. Randa Evens, his gastroenterologist, again.  He might need upper endoscopy.

## 2012-08-14 NOTE — Patient Instructions (Addendum)
Add Omeprazole 40 mg daily otherwise your physician recommends that you continue on your current medications as directed. Please refer to the Current Medication list given to you today.   Your physician wants you to follow-up in: 4 months with fasting labs (lp/bmet/hfp)  You will receive a reminder letter in the mail two months in advance. If you don't receive a letter, please call our office to schedule the follow-up appointment.

## 2012-08-14 NOTE — Assessment & Plan Note (Addendum)
She has a history of dyslipidemia.  His LDL is higher this time and his weight is up 3 pounds.  He is not on any statin drug at the present time.

## 2012-08-14 NOTE — Addendum Note (Signed)
Addended by: Regis Bill B on: 08/14/2012 12:08 PM   Modules accepted: Orders

## 2012-08-14 NOTE — Assessment & Plan Note (Signed)
Blood pressure has been remaining stable on current therapy.  No dizzy spells or syncope.  No symptoms of CHF.

## 2012-08-16 DIAGNOSIS — Z961 Presence of intraocular lens: Secondary | ICD-10-CM | POA: Diagnosis not present

## 2012-08-16 DIAGNOSIS — H02839 Dermatochalasis of unspecified eye, unspecified eyelid: Secondary | ICD-10-CM | POA: Diagnosis not present

## 2012-09-13 DIAGNOSIS — N138 Other obstructive and reflux uropathy: Secondary | ICD-10-CM | POA: Diagnosis not present

## 2012-09-13 DIAGNOSIS — R3129 Other microscopic hematuria: Secondary | ICD-10-CM | POA: Diagnosis not present

## 2012-09-13 DIAGNOSIS — N486 Induration penis plastica: Secondary | ICD-10-CM | POA: Diagnosis not present

## 2012-09-13 DIAGNOSIS — N200 Calculus of kidney: Secondary | ICD-10-CM | POA: Diagnosis not present

## 2012-09-30 DIAGNOSIS — J069 Acute upper respiratory infection, unspecified: Secondary | ICD-10-CM | POA: Diagnosis not present

## 2012-10-04 ENCOUNTER — Telehealth: Payer: Self-pay | Admitting: Cardiology

## 2012-10-04 DIAGNOSIS — E785 Hyperlipidemia, unspecified: Secondary | ICD-10-CM

## 2012-10-04 NOTE — Telephone Encounter (Signed)
Orders in and lab scheduled

## 2012-10-04 NOTE — Telephone Encounter (Signed)
New problem:    Need lab order put into the system wife coming in on  4/7.

## 2012-11-27 DIAGNOSIS — M25569 Pain in unspecified knee: Secondary | ICD-10-CM | POA: Diagnosis not present

## 2012-11-27 DIAGNOSIS — M25469 Effusion, unspecified knee: Secondary | ICD-10-CM | POA: Diagnosis not present

## 2012-11-27 DIAGNOSIS — M1710 Unilateral primary osteoarthritis, unspecified knee: Secondary | ICD-10-CM | POA: Diagnosis not present

## 2012-11-27 DIAGNOSIS — M11869 Other specified crystal arthropathies, unspecified knee: Secondary | ICD-10-CM | POA: Diagnosis not present

## 2012-11-28 DIAGNOSIS — M25469 Effusion, unspecified knee: Secondary | ICD-10-CM | POA: Diagnosis not present

## 2012-11-28 DIAGNOSIS — M25569 Pain in unspecified knee: Secondary | ICD-10-CM | POA: Diagnosis not present

## 2012-11-28 DIAGNOSIS — M1710 Unilateral primary osteoarthritis, unspecified knee: Secondary | ICD-10-CM | POA: Diagnosis not present

## 2012-12-02 DIAGNOSIS — M11869 Other specified crystal arthropathies, unspecified knee: Secondary | ICD-10-CM | POA: Diagnosis not present

## 2012-12-09 ENCOUNTER — Other Ambulatory Visit (INDEPENDENT_AMBULATORY_CARE_PROVIDER_SITE_OTHER): Payer: Medicare Other

## 2012-12-09 DIAGNOSIS — E785 Hyperlipidemia, unspecified: Secondary | ICD-10-CM

## 2012-12-09 LAB — BASIC METABOLIC PANEL
CO2: 26 mEq/L (ref 19–32)
Calcium: 9 mg/dL (ref 8.4–10.5)
GFR: 94.54 mL/min (ref >60.00)
Glucose, Bld: 112 mg/dL — ABNORMAL HIGH (ref 70–99)
Potassium: 3.9 mEq/L (ref 3.5–5.1)
Sodium: 142 mEq/L (ref 135–145)

## 2012-12-09 LAB — HEPATIC FUNCTION PANEL
AST: 20 U/L (ref 0–37)
Albumin: 3.8 g/dL (ref 3.5–5.2)
Alkaline Phosphatase: 49 U/L (ref 39–117)
Bilirubin, Direct: 0.2 mg/dL (ref 0.0–0.3)

## 2012-12-09 LAB — LIPID PANEL
HDL: 41.7 mg/dL (ref >39.00)
Total CHOL/HDL Ratio: 4
VLDL: 15.8 mg/dL (ref 0.0–40.0)

## 2012-12-09 NOTE — Progress Notes (Signed)
Quick Note:  Please make copy of labs for patient visit. ______ 

## 2012-12-12 ENCOUNTER — Ambulatory Visit (INDEPENDENT_AMBULATORY_CARE_PROVIDER_SITE_OTHER): Payer: Medicare Other | Admitting: Cardiology

## 2012-12-12 ENCOUNTER — Encounter: Payer: Self-pay | Admitting: Cardiology

## 2012-12-12 VITALS — BP 122/72 | HR 58 | Ht 76.0 in | Wt 228.8 lb

## 2012-12-12 DIAGNOSIS — K3189 Other diseases of stomach and duodenum: Secondary | ICD-10-CM | POA: Diagnosis not present

## 2012-12-12 DIAGNOSIS — E785 Hyperlipidemia, unspecified: Secondary | ICD-10-CM

## 2012-12-12 DIAGNOSIS — R1013 Epigastric pain: Secondary | ICD-10-CM

## 2012-12-12 DIAGNOSIS — M1120 Other chondrocalcinosis, unspecified site: Secondary | ICD-10-CM | POA: Diagnosis not present

## 2012-12-12 DIAGNOSIS — I119 Hypertensive heart disease without heart failure: Secondary | ICD-10-CM | POA: Diagnosis not present

## 2012-12-12 DIAGNOSIS — M11261 Other chondrocalcinosis, right knee: Secondary | ICD-10-CM

## 2012-12-12 DIAGNOSIS — M11269 Other chondrocalcinosis, unspecified knee: Secondary | ICD-10-CM | POA: Insufficient documentation

## 2012-12-12 NOTE — Progress Notes (Signed)
[name] Date of Birth:  27-Mar-1935 [facility] [address] [place], [place]  [address] [phone]         Fax   [phone]  History of Present Illness: This pleasant 78 year old gentleman is seen for a followup visit. He has a history of atypical chest pain. He has had several prior cardiac catheterizations which have been normal. He was found to have chronic cholecystitis subsequently and underwent laparoscopic cholecystectomy in January 2011 and had a subsequent ERCP for removal of common duct stone. Since then he has been feeling well. Recently he has been experiencing some substernal "indigestion" again. He has tried over-the-counter remedies such as TUMS or Rolaids without any improvement. Sometimes the discomfort lasts all day. It does not appear to be related to physical activity. It does not appear to be related to the type of food he eats.  The symptoms have resolved since he went back on omeprazole.   Current Outpatient Prescriptions  Medication Sig Dispense Refill  . Ascorbic Acid (VITAMIN C) 500 MG tablet Take 1 tablet (500 mg total) by mouth daily.  30 tablet    . aspirin 325 MG tablet Take 325 mg by mouth daily.        . calcium carbonate (TUMS - DOSED IN MG ELEMENTAL CALCIUM) 500 MG chewable tablet Chew 2 tablets by mouth daily. For indigestion      . K-DUR 20 MEQ tablet TAKE 1 TABLET ONCE DAILY.  30 tablet  12  . metoprolol tartrate (LOPRESSOR) 25 MG tablet TAKE 1 TABLET ONCE DAILY.  30 tablet  11  . omeprazole (PRILOSEC) 40 MG capsule Take 1 capsule (40 mg total) by mouth daily.  30 capsule  11   No current facility-administered medications for this visit.    Allergies  Allergen Reactions  . Tape Other (See Comments)    Adhesive tape blisters  . Erythromycin Rash and Other (See Comments)    unknown  . Sulfa Antibiotics Rash  . Tetracyclines & Related Rash    Patient Active Problem List  Diagnosis  . Benign hypertensive heart  disease without heart failure  . BPH (benign prostatic hyperplasia)  . Osteoarthritis  . Dyslipidemia  . Status post cholecystectomy  . Trochanteric Bursitis of right hip  . Dyspepsia  . Pseudogout of knee    History  Smoking status  . Never Smoker   Smokeless tobacco  . Not on file    History  Alcohol Use No    Family History  Problem Relation Age of Onset  . Heart disease Mother   . Heart attack Mother   . Anesthesia problems Neg Hx   . Hypotension Neg Hx   . Malignant hyperthermia Neg Hx   . Pseudochol deficiency Neg Hx     Review of Systems: Constitutional: no fever chills diaphoresis or fatigue or change in weight.  Head and neck: no hearing loss, no epistaxis, no photophobia or visual disturbance. Respiratory: No cough, shortness of breath or wheezing. Cardiovascular: No chest pain peripheral edema, palpitations. Gastrointestinal: No abdominal distention, no abdominal pain, no change in bowel habits hematochezia or melena. Genitourinary: No dysuria, no frequency, no urgency, no nocturia. Musculoskeletal:No arthralgias, no back pain, no gait disturbance or myalgias. Neurological: No dizziness, no headaches, no numbness, no seizures, no syncope, no weakness, no tremors. Hematologic: No lymphadenopathy, no easy bruising. Psychiatric: No confusion, no hallucinations, no sleep disturbance.    Physical Exam: Filed Vitals:   12/12/12 0920  BP: 122/72  Pulse: 58   the general appearance reveals a well-developed large gentleman in no distress.The head and neck exam reveals pupils equal and reactive.  Extraocular movements are full.  There is no scleral icterus.  The mouth and pharynx are normal.  The neck is supple.  The carotids reveal no bruits.  The jugular venous pressure is normal.  The  thyroid is not enlarged.  There is no lymphadenopathy.  The chest is clear to percussion and auscultation.  There are no rales or rhonchi.  Expansion of the chest is symmetrical.   The precordium is quiet.  The first heart sound is normal.  The second heart sound is physiologically split.  There is no murmur gallop rub or click.  There is no abnormal lift or heave.  The abdomen is soft and nontender.  The bowel sounds are normal.  The liver and spleen are not enlarged.  There are no abdominal masses.  There are no abdominal bruits.  Extremities reveal good pedal pulses.  There is no phlebitis or edema.  There is no cyanosis or clubbing.  Strength is normal and symmetrical in all extremities.  There is no lateralizing weakness.  There are no sensory deficits.  The skin is warm and dry.  There is no rash.    Assessment / Plan:  Continue on same medication.  Work harder on weight loss.  Recheck in 4 months for office visit lipid panel hepatic function panel and basal metabolic panel. Recently his exercise has been curtailed because of pseudogout in his right knee.

## 2012-12-12 NOTE — Assessment & Plan Note (Signed)
Patient has a history of dyslipidemia.  LDL is 110.  He is not presently on any statin therapy.  He will try to work harder on diet at this point and increase physical activity and lose weight

## 2012-12-12 NOTE — Assessment & Plan Note (Signed)
Dyspepsia has resolved on omeprazole.

## 2012-12-12 NOTE — Assessment & Plan Note (Signed)
Blood pressure stable on current therapy.  No dizziness or syncope. 

## 2012-12-12 NOTE — Patient Instructions (Addendum)
Your physician recommends that you continue on your current medications as directed. Please refer to the Current Medication list given to you today.  Your physician wants you to follow-up in: 4 months with fasting labs (lp/bmet/hfp) You will receive a reminder letter in the mail two months in advance. If you don't receive a letter, please call our office to schedule the follow-up appointment.  

## 2012-12-27 DIAGNOSIS — M1710 Unilateral primary osteoarthritis, unspecified knee: Secondary | ICD-10-CM | POA: Diagnosis not present

## 2012-12-27 DIAGNOSIS — M11869 Other specified crystal arthropathies, unspecified knee: Secondary | ICD-10-CM | POA: Diagnosis not present

## 2012-12-27 DIAGNOSIS — M25469 Effusion, unspecified knee: Secondary | ICD-10-CM | POA: Diagnosis not present

## 2013-01-30 DIAGNOSIS — M2550 Pain in unspecified joint: Secondary | ICD-10-CM | POA: Diagnosis not present

## 2013-01-30 DIAGNOSIS — M1120 Other chondrocalcinosis, unspecified site: Secondary | ICD-10-CM | POA: Diagnosis not present

## 2013-01-30 DIAGNOSIS — M19049 Primary osteoarthritis, unspecified hand: Secondary | ICD-10-CM | POA: Diagnosis not present

## 2013-01-30 DIAGNOSIS — M5137 Other intervertebral disc degeneration, lumbosacral region: Secondary | ICD-10-CM | POA: Diagnosis not present

## 2013-01-30 DIAGNOSIS — M5030 Other cervical disc degeneration, unspecified cervical region: Secondary | ICD-10-CM | POA: Diagnosis not present

## 2013-01-30 DIAGNOSIS — Z79899 Other long term (current) drug therapy: Secondary | ICD-10-CM | POA: Diagnosis not present

## 2013-03-19 DIAGNOSIS — M1710 Unilateral primary osteoarthritis, unspecified knee: Secondary | ICD-10-CM | POA: Diagnosis not present

## 2013-03-19 DIAGNOSIS — M19049 Primary osteoarthritis, unspecified hand: Secondary | ICD-10-CM | POA: Diagnosis not present

## 2013-03-19 DIAGNOSIS — M6010 Interstitial myositis of unspecified site: Secondary | ICD-10-CM | POA: Diagnosis not present

## 2013-03-19 DIAGNOSIS — M25469 Effusion, unspecified knee: Secondary | ICD-10-CM | POA: Diagnosis not present

## 2013-03-19 DIAGNOSIS — M25569 Pain in unspecified knee: Secondary | ICD-10-CM | POA: Diagnosis not present

## 2013-03-19 DIAGNOSIS — M1120 Other chondrocalcinosis, unspecified site: Secondary | ICD-10-CM | POA: Diagnosis not present

## 2013-03-27 DIAGNOSIS — L259 Unspecified contact dermatitis, unspecified cause: Secondary | ICD-10-CM | POA: Diagnosis not present

## 2013-04-08 ENCOUNTER — Other Ambulatory Visit (INDEPENDENT_AMBULATORY_CARE_PROVIDER_SITE_OTHER): Payer: Medicare Other

## 2013-04-08 DIAGNOSIS — I119 Hypertensive heart disease without heart failure: Secondary | ICD-10-CM

## 2013-04-08 LAB — HEPATIC FUNCTION PANEL
Albumin: 4 g/dL (ref 3.5–5.2)
Alkaline Phosphatase: 44 U/L (ref 39–117)
Bilirubin, Direct: 0.2 mg/dL (ref 0.0–0.3)
Total Protein: 6.5 g/dL (ref 6.0–8.3)

## 2013-04-08 LAB — LIPID PANEL
HDL: 52.1 mg/dL (ref >39.00)
Total CHOL/HDL Ratio: 3
Triglycerides: 76 mg/dL (ref 0.0–149.0)
VLDL: 15.2 mg/dL (ref 0.0–40.0)

## 2013-04-08 LAB — BASIC METABOLIC PANEL
CO2: 28 mEq/L (ref 19–32)
Calcium: 9 mg/dL (ref 8.4–10.5)
Creatinine, Ser: 0.8 mg/dL (ref 0.4–1.5)
GFR: 95.78 mL/min (ref >60.00)
Sodium: 140 mEq/L (ref 135–145)

## 2013-04-08 NOTE — Progress Notes (Signed)
Quick Note:  Please make copy of labs for patient visit. ______ 

## 2013-04-11 ENCOUNTER — Ambulatory Visit (INDEPENDENT_AMBULATORY_CARE_PROVIDER_SITE_OTHER): Payer: Medicare Other | Admitting: Cardiology

## 2013-04-11 ENCOUNTER — Encounter: Payer: Self-pay | Admitting: Cardiology

## 2013-04-11 VITALS — BP 142/83 | HR 52 | Ht 76.0 in | Wt 227.8 lb

## 2013-04-11 DIAGNOSIS — I119 Hypertensive heart disease without heart failure: Secondary | ICD-10-CM

## 2013-04-11 DIAGNOSIS — R21 Rash and other nonspecific skin eruption: Secondary | ICD-10-CM

## 2013-04-11 DIAGNOSIS — E785 Hyperlipidemia, unspecified: Secondary | ICD-10-CM | POA: Diagnosis not present

## 2013-04-11 NOTE — Assessment & Plan Note (Signed)
The patient has not been having any recent chest pain.  He has been more physically active.  He has been playing golf and is enjoying it.  He has not had any palpitations dizziness or syncope.  No symptoms of CHF

## 2013-04-11 NOTE — Progress Notes (Signed)
[name] Date of Birth:  1935/08/07 [facility] [address] [place], [place]  [address] [phone]         Fax   [phone]  History of Present Illness: This pleasant 78 year old gentleman is seen for a followup visit. He has a history of atypical chest pain. He has had several prior cardiac catheterizations which have been normal. He was found to have chronic cholecystitis subsequently and underwent laparoscopic cholecystectomy in January 2011 and had a subsequent ERCP for removal of common duct stone. Since then he has been feeling well. Recently he has been experiencing some substernal "indigestion" again. He has tried over-the-counter remedies such as TUMS or Rolaids without any improvement. Sometimes the discomfort lasts all day. It does not appear to be related to physical activity. It does not appear to be related to the type of food he eats. The symptoms have resolved since he went back on omeprazole. Since last visit he has been doing well except for the development of a skin rash which started after he began taking meloxicam for his arthritis.  Current Outpatient Prescriptions  Medication Sig Dispense Refill  . Ascorbic Acid (VITAMIN C) 500 MG tablet Take 1 tablet (500 mg total) by mouth daily.  30 tablet    . aspirin 325 MG tablet Take 325 mg by mouth daily.        . calcium carbonate (TUMS - DOSED IN MG ELEMENTAL CALCIUM) 500 MG chewable tablet Chew 2 tablets by mouth daily. For indigestion      . K-DUR 20 MEQ tablet TAKE 1 TABLET ONCE DAILY.  30 tablet  12  . metoprolol tartrate (LOPRESSOR) 25 MG tablet TAKE 1 TABLET ONCE DAILY.  30 tablet  11  . omeprazole (PRILOSEC) 40 MG capsule Take 1 capsule (40 mg total) by mouth daily.  30 capsule  11   No current facility-administered medications for this visit.    Allergies  Allergen Reactions  . Tape Other (See Comments)    Adhesive tape blisters  . Erythromycin Rash and Other (See Comments)   unknown  . Mobic (Meloxicam) Rash  . Sulfa Antibiotics Rash  . Tetracyclines & Related Rash    Patient Active Problem List   Diagnosis Date Noted  . Benign hypertensive heart disease without heart failure 12/13/2010    Priority: Medium  . Dyslipidemia 12/13/2010    Priority: Medium  . Status post cholecystectomy 12/13/2010    Priority: Medium  . Skin rash 04/11/2013  . Pseudogout of knee 12/12/2012  . Dyspepsia 08/14/2012  . Trochanteric Bursitis of right hip 07/18/2011  . BPH (benign prostatic hyperplasia) 12/13/2010  . Osteoarthritis 12/13/2010    History  Smoking status  . Never Smoker   Smokeless tobacco  . Not on file    History  Alcohol Use No    Family History  Problem Relation Age of Onset  . Heart disease Mother   . Heart attack Mother   . Anesthesia problems Neg Hx   . Hypotension Neg Hx   . Malignant hyperthermia Neg Hx   . Pseudochol deficiency Neg Hx     Review of Systems: Constitutional: no fever chills diaphoresis or fatigue or change in weight.  Head and neck: no hearing loss, no epistaxis, no photophobia or visual disturbance. Respiratory: No cough, shortness of breath or wheezing. Cardiovascular: No chest pain peripheral edema, palpitations. Gastrointestinal: No abdominal distention, no abdominal pain, no change in bowel habits hematochezia or melena. Genitourinary: No dysuria, no frequency,  no urgency, no nocturia. Musculoskeletal:No arthralgias, no back pain, no gait disturbance or myalgias. Neurological: No dizziness, no headaches, no numbness, no seizures, no syncope, no weakness, no tremors. Hematologic: No lymphadenopathy, no easy bruising. Psychiatric: No confusion, no hallucinations, no sleep disturbance.    Physical Exam: Filed Vitals:   04/11/13 0955  BP: 142/83  Pulse: 52   the general appearance reveals a well-developed well-nourished gentleman in no distress.The head and neck exam reveals pupils equal and reactive.   Extraocular movements are full.  There is no scleral icterus.  The mouth and pharynx are normal.  The neck is supple.  The carotids reveal no bruits.  The jugular venous pressure is normal.  The  thyroid is not enlarged.  There is no lymphadenopathy.  The chest is clear to percussion and auscultation.  There are no rales or rhonchi.  Expansion of the chest is symmetrical.  The precordium is quiet.  The first heart sound is normal.  The second heart sound is physiologically split.  There is no murmur gallop rub or click.  There is no abnormal lift or heave.  The abdomen is soft and nontender.  The bowel sounds are normal.  The liver and spleen are not enlarged.  There are no abdominal masses.  There are no abdominal bruits.  Extremities reveal good pedal pulses.  There is no phlebitis or edema.  There is no cyanosis or clubbing.  Strength is normal and symmetrical in all extremities.  There is no lateralizing weakness.  There are no sensory deficits.  The skin is warm and dry.  There is no rash.     Assessment / Plan: Continue same medication.  Stop meloxicam.  Recheck in 4 months for followup office visit lipid panel hepatic function panel and basal metabolic panel.  Since last visit he has lost 1 pound.  Continue careful diet.

## 2013-04-11 NOTE — Patient Instructions (Signed)
Your physician recommends that you schedule a follow-up appointment in: 4 months   Your physician has recommended you make the following change in your medication:   Stop meloxicam / mobic, it may be causing your skin rash   Your physician recommends that you return for a FASTING lipid profile: 4 months

## 2013-04-11 NOTE — Assessment & Plan Note (Signed)
Patient has a history of dyslipidemia.  He is managing this with diet alone.  We reviewed his recent labs with him and they are satisfactory.

## 2013-04-22 ENCOUNTER — Other Ambulatory Visit: Payer: Self-pay | Admitting: Cardiology

## 2013-05-19 DIAGNOSIS — M25569 Pain in unspecified knee: Secondary | ICD-10-CM | POA: Diagnosis not present

## 2013-05-19 DIAGNOSIS — M19049 Primary osteoarthritis, unspecified hand: Secondary | ICD-10-CM | POA: Diagnosis not present

## 2013-05-19 DIAGNOSIS — M1710 Unilateral primary osteoarthritis, unspecified knee: Secondary | ICD-10-CM | POA: Diagnosis not present

## 2013-05-19 DIAGNOSIS — M6010 Interstitial myositis of unspecified site: Secondary | ICD-10-CM | POA: Diagnosis not present

## 2013-05-23 ENCOUNTER — Other Ambulatory Visit: Payer: Self-pay | Admitting: Cardiology

## 2013-06-30 DIAGNOSIS — M5030 Other cervical disc degeneration, unspecified cervical region: Secondary | ICD-10-CM | POA: Diagnosis not present

## 2013-06-30 DIAGNOSIS — M1120 Other chondrocalcinosis, unspecified site: Secondary | ICD-10-CM | POA: Diagnosis not present

## 2013-06-30 DIAGNOSIS — M25469 Effusion, unspecified knee: Secondary | ICD-10-CM | POA: Diagnosis not present

## 2013-06-30 DIAGNOSIS — M25569 Pain in unspecified knee: Secondary | ICD-10-CM | POA: Diagnosis not present

## 2013-06-30 DIAGNOSIS — M5137 Other intervertebral disc degeneration, lumbosacral region: Secondary | ICD-10-CM | POA: Diagnosis not present

## 2013-07-08 DIAGNOSIS — M1710 Unilateral primary osteoarthritis, unspecified knee: Secondary | ICD-10-CM | POA: Diagnosis not present

## 2013-07-14 DIAGNOSIS — M1710 Unilateral primary osteoarthritis, unspecified knee: Secondary | ICD-10-CM | POA: Diagnosis not present

## 2013-07-14 DIAGNOSIS — M23359 Other meniscus derangements, posterior horn of lateral meniscus, unspecified knee: Secondary | ICD-10-CM | POA: Diagnosis not present

## 2013-07-18 DIAGNOSIS — B354 Tinea corporis: Secondary | ICD-10-CM | POA: Diagnosis not present

## 2013-07-24 DIAGNOSIS — IMO0002 Reserved for concepts with insufficient information to code with codable children: Secondary | ICD-10-CM | POA: Diagnosis not present

## 2013-07-24 DIAGNOSIS — M9420 Chondromalacia, unspecified site: Secondary | ICD-10-CM | POA: Diagnosis not present

## 2013-07-24 DIAGNOSIS — M23359 Other meniscus derangements, posterior horn of lateral meniscus, unspecified knee: Secondary | ICD-10-CM | POA: Diagnosis not present

## 2013-07-24 DIAGNOSIS — M659 Synovitis and tenosynovitis, unspecified: Secondary | ICD-10-CM | POA: Diagnosis not present

## 2013-07-24 DIAGNOSIS — M1710 Unilateral primary osteoarthritis, unspecified knee: Secondary | ICD-10-CM | POA: Diagnosis not present

## 2013-07-24 DIAGNOSIS — G8918 Other acute postprocedural pain: Secondary | ICD-10-CM | POA: Diagnosis not present

## 2013-07-24 DIAGNOSIS — M23302 Other meniscus derangements, unspecified lateral meniscus, unspecified knee: Secondary | ICD-10-CM | POA: Diagnosis not present

## 2013-07-24 DIAGNOSIS — M1120 Other chondrocalcinosis, unspecified site: Secondary | ICD-10-CM | POA: Diagnosis not present

## 2013-08-08 ENCOUNTER — Telehealth: Payer: Self-pay | Admitting: Interventional Cardiology

## 2013-08-08 NOTE — Telephone Encounter (Signed)
Cancel error

## 2013-08-11 ENCOUNTER — Encounter: Payer: Self-pay | Admitting: Cardiology

## 2013-08-11 ENCOUNTER — Ambulatory Visit (INDEPENDENT_AMBULATORY_CARE_PROVIDER_SITE_OTHER): Payer: Medicare Other | Admitting: Cardiology

## 2013-08-11 ENCOUNTER — Other Ambulatory Visit: Payer: Medicare Other

## 2013-08-11 VITALS — BP 115/70 | HR 56 | Ht 76.0 in | Wt 229.0 lb

## 2013-08-11 DIAGNOSIS — I119 Hypertensive heart disease without heart failure: Secondary | ICD-10-CM | POA: Diagnosis not present

## 2013-08-11 DIAGNOSIS — M1990 Unspecified osteoarthritis, unspecified site: Secondary | ICD-10-CM | POA: Diagnosis not present

## 2013-08-11 DIAGNOSIS — E785 Hyperlipidemia, unspecified: Secondary | ICD-10-CM | POA: Diagnosis not present

## 2013-08-11 LAB — LIPID PANEL
Cholesterol: 177 mg/dL (ref 0–200)
HDL: 37.5 mg/dL — ABNORMAL LOW (ref >39.00)
Total CHOL/HDL Ratio: 5
VLDL: 19.8 mg/dL (ref 0.0–40.0)

## 2013-08-11 LAB — HEPATIC FUNCTION PANEL
ALT: 19 U/L (ref 0–53)
AST: 19 U/L (ref 0–37)
Alkaline Phosphatase: 44 U/L (ref 39–117)
Bilirubin, Direct: 0.2 mg/dL (ref 0.0–0.3)
Total Protein: 7.1 g/dL (ref 6.0–8.3)

## 2013-08-11 LAB — BASIC METABOLIC PANEL
Calcium: 9.1 mg/dL (ref 8.4–10.5)
Creatinine, Ser: 0.8 mg/dL (ref 0.4–1.5)
GFR: 101.3 mL/min (ref >60.00)
Potassium: 3.9 mEq/L (ref 3.5–5.1)
Sodium: 140 mEq/L (ref 135–145)

## 2013-08-11 NOTE — Assessment & Plan Note (Signed)
Patient had recent successful surgery on his right knee.  Initially he had to be less active and has gained 2 pounds as a result

## 2013-08-11 NOTE — Assessment & Plan Note (Signed)
Patient has a history of dyslipidemia being treated with diet alone.  Blood work today is pending

## 2013-08-11 NOTE — Assessment & Plan Note (Signed)
The patient denies any chest pain or shortness of breath.  He has not been aware of any palpitations.

## 2013-08-11 NOTE — Progress Notes (Signed)
[name] Date of Birth:  10/20/1934 [address] [place], [place]  [address] [phone]         Fax   [phone]  History of Present Illness: This pleasant 78 year old gentleman is seen for a followup visit. He has a history of atypical chest pain. He has had several prior cardiac catheterizations which have been normal. He was found to have chronic cholecystitis subsequently and underwent laparoscopic cholecystectomy in January 2011 and had a subsequent ERCP for removal of common duct stone. Since then he has been feeling well.  At his last visit he had a skin rash which she had attributed to taking Mobic.  However he saw his dermatologist who diagnosed a fungal infection and treated at with good resolution.  The rash was not from Mobic. Since last visit the patient had a torn meniscus in his right knee and underwent laparoscopic surgery by Dr. Cleophas Dunker several weeks ago.  Current Outpatient Prescriptions  Medication Sig Dispense Refill  . Ascorbic Acid (VITAMIN C) 500 MG tablet Take 1 tablet (500 mg total) by mouth daily.  30 tablet    . aspirin 325 MG tablet Take 325 mg by mouth daily.        . calcium carbonate (TUMS - DOSED IN MG ELEMENTAL CALCIUM) 500 MG chewable tablet Chew 2 tablets by mouth as needed. For indigestion      . metoprolol tartrate (LOPRESSOR) 25 MG tablet TAKE 1 TABLET ONCE DAILY.  30 tablet  6  . omeprazole (PRILOSEC) 40 MG capsule Take 1 capsule (40 mg total) by mouth daily.  30 capsule  11  . potassium chloride SA (K-DUR,KLOR-CON) 20 MEQ tablet TAKE 1 TABLET ONCE DAILY.  30 tablet  6   No current facility-administered medications for this visit.    Allergies  Allergen Reactions  . Tape Other (See Comments)    Adhesive tape blisters  . Erythromycin Rash and Other (See Comments)    unknown  . Mobic [Meloxicam] Rash  . Sulfa Antibiotics Rash  . Tetracyclines & Related Rash    Patient Active Problem List   Diagnosis Date Noted    . Benign hypertensive heart disease without heart failure 12/13/2010    Priority: Medium  . Dyslipidemia 12/13/2010    Priority: Medium  . Status post cholecystectomy 12/13/2010    Priority: Medium  . Skin rash 04/11/2013  . Pseudogout of knee 12/12/2012  . Dyspepsia 08/14/2012  . Trochanteric Bursitis of right hip 07/18/2011  . BPH (benign prostatic hyperplasia) 12/13/2010  . Osteoarthritis 12/13/2010    History  Smoking status  . Never Smoker   Smokeless tobacco  . Not on file    History  Alcohol Use No    Family History  Problem Relation Age of Onset  . Heart disease Mother   . Heart attack Mother   . Anesthesia problems Neg Hx   . Hypotension Neg Hx   . Malignant hyperthermia Neg Hx   . Pseudochol deficiency Neg Hx     Review of Systems: Constitutional: no fever chills diaphoresis or fatigue or change in weight.  Head and neck: no hearing loss, no epistaxis, no photophobia or visual disturbance. Respiratory: No cough, shortness of breath or wheezing. Cardiovascular: No chest pain peripheral edema, palpitations. Gastrointestinal: No abdominal distention, no abdominal pain, no change in bowel habits hematochezia or melena. Genitourinary: No dysuria, no frequency, no urgency, no nocturia. Musculoskeletal:No arthralgias, no back pain, no gait disturbance or myalgias. Neurological: No dizziness, no  headaches, no numbness, no seizures, no syncope, no weakness, no tremors. Hematologic: No lymphadenopathy, no easy bruising. Psychiatric: No confusion, no hallucinations, no sleep disturbance.    Physical Exam: Filed Vitals:   08/11/13 1022  BP: 115/70  Pulse: 56   the general appearance reveals a well-developed well-nourished gentleman in no distress.The head and neck exam reveals pupils equal and reactive.  Extraocular movements are full.  There is no scleral icterus.  The mouth and pharynx are normal.  The neck is supple.  The carotids reveal no bruits.  The  jugular venous pressure is normal.  The  thyroid is not enlarged.  There is no lymphadenopathy.  The chest is clear to percussion and auscultation.  There are no rales or rhonchi.  Expansion of the chest is symmetrical.  The precordium is quiet.  The first heart sound is normal.  The second heart sound is physiologically split.  There is no murmur gallop rub or click.  There is no abnormal lift or heave.  The abdomen is soft and nontender.  The bowel sounds are normal.  The liver and spleen are not enlarged.  There are no abdominal masses.  There are no abdominal bruits.  Extremities reveal good pedal pulses.  There is no phlebitis or edema.  There is no cyanosis or clubbing.  Strength is normal and symmetrical in all extremities.  There is no lateralizing weakness.  There are no sensory deficits.  The skin is warm and dry.  There is no rash.  EKG today shows sinus bradycardia and incomplete right bundle branch block it is unchanged since 08/14/12   Assessment / Plan: Continue same medication.  Continue careful diet.  Recheck in 4 months for office visit lipid panel hepatic function panel and basal metabolic panel.

## 2013-08-11 NOTE — Patient Instructions (Signed)

## 2013-08-11 NOTE — Progress Notes (Signed)
Quick Note:  Please report to patient. The recent labs are stable. Continue same medication and careful diet. LDL higher, watch diet. ______

## 2013-08-12 ENCOUNTER — Telehealth: Payer: Self-pay | Admitting: *Deleted

## 2013-08-12 NOTE — Telephone Encounter (Signed)
Message copied by Burnell Blanks on Tue Aug 12, 2013  9:22 AM ------      Message from: Cassell Clement      Created: Mon Aug 11, 2013  9:08 PM       Please report to patient.  The recent labs are stable. Continue same medication and careful diet. LDL higher, watch diet. ------

## 2013-08-12 NOTE — Telephone Encounter (Signed)
Advised patient of lab results  

## 2013-08-22 ENCOUNTER — Other Ambulatory Visit: Payer: Self-pay | Admitting: Cardiology

## 2013-08-25 DIAGNOSIS — M23359 Other meniscus derangements, posterior horn of lateral meniscus, unspecified knee: Secondary | ICD-10-CM | POA: Diagnosis not present

## 2013-08-25 DIAGNOSIS — M1710 Unilateral primary osteoarthritis, unspecified knee: Secondary | ICD-10-CM | POA: Diagnosis not present

## 2013-09-16 DIAGNOSIS — N200 Calculus of kidney: Secondary | ICD-10-CM | POA: Diagnosis not present

## 2013-09-16 DIAGNOSIS — N401 Enlarged prostate with lower urinary tract symptoms: Secondary | ICD-10-CM | POA: Diagnosis not present

## 2013-09-16 DIAGNOSIS — R3129 Other microscopic hematuria: Secondary | ICD-10-CM | POA: Diagnosis not present

## 2013-09-16 DIAGNOSIS — N138 Other obstructive and reflux uropathy: Secondary | ICD-10-CM | POA: Diagnosis not present

## 2013-09-16 DIAGNOSIS — N139 Obstructive and reflux uropathy, unspecified: Secondary | ICD-10-CM | POA: Diagnosis not present

## 2013-10-06 DIAGNOSIS — M1710 Unilateral primary osteoarthritis, unspecified knee: Secondary | ICD-10-CM | POA: Diagnosis not present

## 2013-10-06 DIAGNOSIS — M23359 Other meniscus derangements, posterior horn of lateral meniscus, unspecified knee: Secondary | ICD-10-CM | POA: Diagnosis not present

## 2013-11-14 ENCOUNTER — Other Ambulatory Visit: Payer: Self-pay | Admitting: Cardiology

## 2013-11-17 DIAGNOSIS — M1710 Unilateral primary osteoarthritis, unspecified knee: Secondary | ICD-10-CM | POA: Diagnosis not present

## 2013-12-15 ENCOUNTER — Other Ambulatory Visit: Payer: Self-pay | Admitting: Cardiology

## 2013-12-25 ENCOUNTER — Other Ambulatory Visit (INDEPENDENT_AMBULATORY_CARE_PROVIDER_SITE_OTHER): Payer: Medicare Other

## 2013-12-25 DIAGNOSIS — I119 Hypertensive heart disease without heart failure: Secondary | ICD-10-CM

## 2013-12-25 DIAGNOSIS — E785 Hyperlipidemia, unspecified: Secondary | ICD-10-CM | POA: Diagnosis not present

## 2013-12-25 DIAGNOSIS — M1710 Unilateral primary osteoarthritis, unspecified knee: Secondary | ICD-10-CM | POA: Diagnosis not present

## 2013-12-25 LAB — BASIC METABOLIC PANEL
BUN: 17 mg/dL (ref 6–23)
CO2: 29 mEq/L (ref 19–32)
CREATININE: 0.8 mg/dL (ref 0.4–1.5)
Calcium: 9.1 mg/dL (ref 8.4–10.5)
Chloride: 105 mEq/L (ref 96–112)
GFR: 95.59 mL/min (ref >60.00)
GLUCOSE: 107 mg/dL — AB (ref 70–99)
Potassium: 3.9 mEq/L (ref 3.5–5.1)
Sodium: 141 mEq/L (ref 135–145)

## 2013-12-25 LAB — HEPATIC FUNCTION PANEL
ALK PHOS: 45 U/L (ref 39–117)
ALT: 20 U/L (ref 0–53)
AST: 23 U/L (ref 0–37)
Albumin: 3.9 g/dL (ref 3.5–5.2)
BILIRUBIN TOTAL: 1.2 mg/dL (ref 0.3–1.2)
Bilirubin, Direct: 0.1 mg/dL (ref 0.0–0.3)
Total Protein: 6.5 g/dL (ref 6.0–8.3)

## 2013-12-25 LAB — LIPID PANEL
CHOLESTEROL: 145 mg/dL (ref 0–200)
HDL: 42.2 mg/dL (ref >39.00)
LDL Cholesterol: 86 mg/dL (ref 0–99)
Total CHOL/HDL Ratio: 3
Triglycerides: 86 mg/dL (ref 0.0–149.0)
VLDL: 17.2 mg/dL (ref 0.0–40.0)

## 2013-12-25 NOTE — Progress Notes (Signed)
Quick Note:  Please make copy of labs for patient visit. ______ 

## 2013-12-29 ENCOUNTER — Encounter: Payer: Self-pay | Admitting: Cardiology

## 2013-12-29 ENCOUNTER — Ambulatory Visit (INDEPENDENT_AMBULATORY_CARE_PROVIDER_SITE_OTHER): Payer: Medicare Other | Admitting: Cardiology

## 2013-12-29 VITALS — BP 122/68 | HR 69 | Ht 76.0 in | Wt 228.0 lb

## 2013-12-29 DIAGNOSIS — E785 Hyperlipidemia, unspecified: Secondary | ICD-10-CM | POA: Diagnosis not present

## 2013-12-29 DIAGNOSIS — R1013 Epigastric pain: Secondary | ICD-10-CM

## 2013-12-29 DIAGNOSIS — K3189 Other diseases of stomach and duodenum: Secondary | ICD-10-CM

## 2013-12-29 DIAGNOSIS — I119 Hypertensive heart disease without heart failure: Secondary | ICD-10-CM | POA: Diagnosis not present

## 2013-12-29 DIAGNOSIS — M1990 Unspecified osteoarthritis, unspecified site: Secondary | ICD-10-CM

## 2013-12-29 NOTE — Progress Notes (Signed)
[name] Date of Birth:  1935/05/21 [address] [place],   [address] [phone]         Fax   [phone]  History of Present Illness: This pleasant 79 year old gentleman is seen for a followup visit. He has a history of atypical chest pain. He has had several prior cardiac catheterizations which have been normal. He was found to have chronic cholecystitis subsequently and underwent laparoscopic cholecystectomy in January 2011 and had a subsequent ERCP for removal of common duct stone. Since then he has been feeling well.  At his last visit he had a skin rash which she had attributed to taking Mobic.  However he saw his dermatologist who diagnosed a fungal infection and treated at with good resolution.  The rash was not from Mobic. Since last visit the patient had a torn meniscus in his right knee and underwent laparoscopic surgery by Dr. Durward Fortes several weeks ago.  He is looking forward to playing golf this spring.  Overall he has been doing well with no new complaints.  Current Outpatient Prescriptions  Medication Sig Dispense Refill  . Ascorbic Acid (VITAMIN C) 500 MG tablet Take 1 tablet (500 mg total) by mouth daily.  30 tablet    . aspirin 325 MG tablet Take 325 mg by mouth daily.        . calcium carbonate (TUMS - DOSED IN MG ELEMENTAL CALCIUM) 500 MG chewable tablet Chew 2 tablets by mouth as needed. For indigestion      . metoprolol tartrate (LOPRESSOR) 25 MG tablet TAKE 1 TABLET ONCE DAILY.  30 tablet  1  . omeprazole (PRILOSEC) 40 MG capsule TAKE (1) CAPSULE DAILY.  30 capsule  PRN  . potassium chloride SA (K-DUR,KLOR-CON) 20 MEQ tablet TAKE 1 TABLET ONCE DAILY.  30 tablet  1   No current facility-administered medications for this visit.    Allergies  Allergen Reactions  . Tape Other (See Comments)    Adhesive tape blisters  . Erythromycin Rash and Other (See Comments)    unknown  . Sulfa Antibiotics Rash  . Tetracyclines & Related Rash     Patient Active Problem List   Diagnosis Date Noted  . Benign hypertensive heart disease without heart failure 12/13/2010    Priority: Medium  . Dyslipidemia 12/13/2010    Priority: Medium  . Status post cholecystectomy 12/13/2010    Priority: Medium  . Skin rash 04/11/2013  . Pseudogout of knee 12/12/2012  . Dyspepsia 08/14/2012  . Trochanteric Bursitis of right hip 07/18/2011  . BPH (benign prostatic hyperplasia) 12/13/2010  . Osteoarthritis 12/13/2010    History  Smoking status  . Never Smoker   Smokeless tobacco  . Not on file    History  Alcohol Use No    Family History  Problem Relation Age of Onset  . Heart disease Mother   . Heart attack Mother   . Anesthesia problems Neg Hx   . Hypotension Neg Hx   . Malignant hyperthermia Neg Hx   . Pseudochol deficiency Neg Hx     Review of Systems: Constitutional: no fever chills diaphoresis or fatigue or change in weight.  Head and neck: no hearing loss, no epistaxis, no photophobia or visual disturbance. Respiratory: No cough, shortness of breath or wheezing. Cardiovascular: No chest pain peripheral edema, palpitations. Gastrointestinal: No abdominal distention, no abdominal pain, no change in bowel habits hematochezia or melena. Genitourinary: No dysuria, no frequency, no urgency, no nocturia. Musculoskeletal:No arthralgias, no back  pain, no gait disturbance or myalgias. Neurological: No dizziness, no headaches, no numbness, no seizures, no syncope, no weakness, no tremors. Hematologic: No lymphadenopathy, no easy bruising. Psychiatric: No confusion, no hallucinations, no sleep disturbance.    Physical Exam: Filed Vitals:   12/29/13 1437  BP: 122/68  Pulse: 69   the general appearance reveals a well-developed well-nourished gentleman in no distress.The head and neck exam reveals pupils equal and reactive.  Extraocular movements are full.  There is no scleral icterus.  The mouth and pharynx are normal.  The  neck is supple.  The carotids reveal no bruits.  The jugular venous pressure is normal.  The  thyroid is not enlarged.  There is no lymphadenopathy.  The chest is clear to percussion and auscultation.  There are no rales or rhonchi.  Expansion of the chest is symmetrical.  The precordium is quiet.  The first heart sound is normal.  The second heart sound is physiologically split.  There is no murmur gallop rub or click.  There is no abnormal lift or heave.  The abdomen is soft and nontender.  The bowel sounds are normal.  The liver and spleen are not enlarged.  There are no abdominal masses.  There are no abdominal bruits.  Extremities reveal good pedal pulses.  There is no phlebitis or edema.  There is no cyanosis or clubbing.  Strength is normal and symmetrical in all extremities.  There is no lateralizing weakness.  There are no sensory deficits.  The skin is warm and dry.  There is no rash.     Assessment / Plan: Continue same medication.  Continue careful diet.  Recheck in 4 months for office visit lipid panel hepatic function panel and basal metabolic panel.  Continue close followup with his orthopedist regarding his arthritis of both knees.  He has been told that the next step would be knee replacement.  He has been favoring his left knee and now both knees are bone-on-bone.

## 2013-12-29 NOTE — Patient Instructions (Signed)
Your physician recommends that you continue on your current medications as directed. Please refer to the Current Medication list given to you today.  Your physician wants you to follow-up in: 4 months with fasting labs (lp/bmet/hfp) You will receive a reminder letter in the mail two months in advance. If you don't receive a letter, please call our office to schedule the follow-up appointment.  

## 2013-12-29 NOTE — Assessment & Plan Note (Signed)
Since last visit the patient has not had any recurrent dyspepsia or GI symptoms.

## 2013-12-29 NOTE — Assessment & Plan Note (Signed)
Blood pressure has been staying regular and in therapeutic range on metoprolol.  No chest pain or shortness of breath or palpitations.  No symptoms of CHF.

## 2013-12-29 NOTE — Assessment & Plan Note (Signed)
Since last visit his lipids have remained stable and are improved from last visit.  Is not currently on any statin therapy.

## 2014-01-08 DIAGNOSIS — M1710 Unilateral primary osteoarthritis, unspecified knee: Secondary | ICD-10-CM | POA: Diagnosis not present

## 2014-01-08 DIAGNOSIS — M1120 Other chondrocalcinosis, unspecified site: Secondary | ICD-10-CM | POA: Diagnosis not present

## 2014-02-16 ENCOUNTER — Other Ambulatory Visit: Payer: Self-pay | Admitting: Cardiology

## 2014-02-26 ENCOUNTER — Telehealth: Payer: Self-pay | Admitting: Cardiology

## 2014-02-26 DIAGNOSIS — M1710 Unilateral primary osteoarthritis, unspecified knee: Secondary | ICD-10-CM | POA: Diagnosis not present

## 2014-02-26 NOTE — Telephone Encounter (Signed)
Walk In pt Form " [facility]" Clearance gave to [facility]  6.25.15/km

## 2014-03-04 ENCOUNTER — Telehealth: Payer: Self-pay | Admitting: Cardiology

## 2014-03-04 NOTE — Telephone Encounter (Signed)
Advised patient form faxed

## 2014-03-04 NOTE — Telephone Encounter (Signed)
Received request from Nurse fax box, documents faxed for surgical clearance. °To: [facility] °Fax number: [phone] °Attention: °7.1.15/km °

## 2014-03-04 NOTE — Telephone Encounter (Signed)
New Message:  Pt is calling to check the status of his surgical clearance form for a knee replacement.

## 2014-03-18 ENCOUNTER — Encounter (HOSPITAL_COMMUNITY): Payer: Self-pay | Admitting: Pharmacy Technician

## 2014-03-18 DIAGNOSIS — M1710 Unilateral primary osteoarthritis, unspecified knee: Secondary | ICD-10-CM | POA: Diagnosis not present

## 2014-03-19 NOTE — Pre-Procedure Instructions (Signed)
HARACE MCCLUNEY  03/19/2014   Your procedure is scheduled on:  Tuesday, July 28.  Report to [facility] Admitting at 5:30 AM.  Call this number if you have problems the morning of surgery: [phone]   Remember:   Do not eat food or drink liquids after midnight Monday, July 27.   Take these medicines the morning of surgery with A SIP OF WATER: metoprolol tartrate (LOPRESSOR),  omeprazole (PRILOSEC).              Stop taking Aspirin and Vitamins July 21.     Do not wear jewelry, make-up or nail polish.  Do not wear lotions, powders, or perfumes.    Men may shave face and neck.  Do not bring valuables to the hospital.              [facility] is not responsible for any belongings or valuables.               Contacts, dentures or bridgework may not be worn into surgery.  Leave suitcase in the car. After surgery it may be brought to your room.  For patients admitted to the hospital, discharge time is determined by your treatment team.                 Special Instructions: -   Please read over the following fact sheets that you were given: Pain Booklet, Coughing and Deep Breathing, Blood Transfusion Information and Surgical Site Infection Prevention And Incentive Spirometery

## 2014-03-20 ENCOUNTER — Ambulatory Visit (HOSPITAL_COMMUNITY)
Admission: RE | Admit: 2014-03-20 | Discharge: 2014-03-20 | Disposition: A | Discharge status: Home / Self Care | Payer: Medicare Other | Source: Ambulatory Visit | Attending: Orthopedic Surgery | Admitting: Orthopedic Surgery

## 2014-03-20 ENCOUNTER — Encounter (HOSPITAL_COMMUNITY)
Admission: RE | Admit: 2014-03-20 | Discharge: 2014-03-20 | Disposition: A | Discharge status: Home / Self Care | Payer: Medicare Other | Source: Ambulatory Visit | Attending: Orthopaedic Surgery | Admitting: Orthopaedic Surgery

## 2014-03-20 ENCOUNTER — Encounter (HOSPITAL_COMMUNITY): Payer: Self-pay

## 2014-03-20 DIAGNOSIS — Z01811 Encounter for preprocedural respiratory examination: Secondary | ICD-10-CM | POA: Diagnosis not present

## 2014-03-20 DIAGNOSIS — Z01818 Encounter for other preprocedural examination: Secondary | ICD-10-CM | POA: Diagnosis not present

## 2014-03-20 LAB — TYPE AND SCREEN
ABO/RH(D): O NEG
Antibody Screen: NEGATIVE

## 2014-03-20 LAB — CBC WITH DIFFERENTIAL/PLATELET
Basophils Absolute: 0 10*3/uL (ref 0.0–0.1)
Basophils Relative: 1 % (ref 0–1)
EOS ABS: 0.3 10*3/uL (ref 0.0–0.7)
EOS PCT: 5 % (ref 0–5)
HEMATOCRIT: 44.8 % (ref 39.0–52.0)
HEMOGLOBIN: 16.2 g/dL (ref 13.0–17.0)
LYMPHS ABS: 2 10*3/uL (ref 0.7–4.0)
Lymphocytes Relative: 31 % (ref 12–46)
MCH: 32.1 pg (ref 26.0–34.0)
MCHC: 36.2 g/dL — ABNORMAL HIGH (ref 30.0–36.0)
MCV: 88.7 fL (ref 78.0–100.0)
MONO ABS: 0.5 10*3/uL (ref 0.1–1.0)
MONOS PCT: 8 % (ref 3–12)
Neutro Abs: 3.6 10*3/uL (ref 1.7–7.7)
Neutrophils Relative %: 55 % (ref 43–77)
PLATELETS: 133 10*3/uL — AB (ref 150–400)
RBC: 5.05 MIL/uL (ref 4.22–5.81)
RDW: 13.1 % (ref 11.5–15.5)
WBC: 6.4 10*3/uL (ref 4.0–10.5)

## 2014-03-20 LAB — SURGICAL PCR SCREEN
MRSA, PCR: NEGATIVE
Staphylococcus aureus: NEGATIVE

## 2014-03-20 LAB — URINALYSIS, ROUTINE W REFLEX MICROSCOPIC
Bilirubin Urine: NEGATIVE
GLUCOSE, UA: NEGATIVE mg/dL
Ketones, ur: NEGATIVE mg/dL
Leukocytes, UA: NEGATIVE
Nitrite: NEGATIVE
PH: 5.5 (ref 5.0–8.0)
PROTEIN: NEGATIVE mg/dL
Specific Gravity, Urine: 1.024 (ref 1.005–1.030)
Urobilinogen, UA: 1 mg/dL (ref 0.0–1.0)

## 2014-03-20 LAB — PROTIME-INR
INR: 1.08 (ref 0.00–1.49)
Prothrombin Time: 14 seconds (ref 11.6–15.2)

## 2014-03-20 LAB — COMPREHENSIVE METABOLIC PANEL
ALT: 17 U/L (ref 0–53)
ANION GAP: 15 (ref 5–15)
AST: 21 U/L (ref 0–37)
Albumin: 4 g/dL (ref 3.5–5.2)
Alkaline Phosphatase: 55 U/L (ref 39–117)
BUN: 20 mg/dL (ref 6–23)
CALCIUM: 9.1 mg/dL (ref 8.4–10.5)
CO2: 25 mEq/L (ref 19–32)
Chloride: 104 mEq/L (ref 96–112)
Creatinine, Ser: 0.74 mg/dL (ref 0.50–1.35)
GFR, EST NON AFRICAN AMERICAN: 87 mL/min — AB (ref >90)
GLUCOSE: 139 mg/dL — AB (ref 70–99)
Potassium: 4.1 mEq/L (ref 3.7–5.3)
Sodium: 144 mEq/L (ref 137–147)
Total Bilirubin: 1.1 mg/dL (ref 0.3–1.2)
Total Protein: 7 g/dL (ref 6.0–8.3)

## 2014-03-20 LAB — URINE MICROSCOPIC-ADD ON

## 2014-03-20 LAB — APTT: aPTT: 28 seconds (ref 24–37)

## 2014-03-20 NOTE — Progress Notes (Signed)
Had been having "chest pains'' yrs ago.  He went to see Dr. Mare Ferrari, cath was done and coronaries were clean. The problem was not the heart, but gallbladder.  No problems since.  LOV with Dr. Mare Ferrari was 12/2013.

## 2014-03-21 LAB — URINE CULTURE: Colony Count: 4000

## 2014-03-25 NOTE — H&P (Signed)
CHIEF COMPLAINT:  Painful right knee.   HISTORY OF PRESENT ILLNESS:  [name] is a very pleasant 79 year old white male who is seen today for evaluation of his right knee.  He has been having problems with  his right knee dating back several years.  He did have an arthroscopic debridement at the end of 2014 and was noted to have significant arthritis in the knee.  He has had recurrent effusions and has undergone several aspirations and corticosteroid injections.  He was tried on Voltaren as an anti-inflammatory but continues to have difficulties.  He has also used over-the-counter Advil and Aleve.  He comes back with persistent effusion and despite the Depo-Medrol still has difficulties.  He is now to the point where he is having unbearable pain, and he has pain with every step as well as nighttime pain and pain with activities of daily living.  He limps and actually does have to use an assistive device at times.  He is seen today for reevaluation.   PAST MEDICAL/SURGICAL HISTORY:  In general, he has had 4 back surgeries in 1964 and 1976.  He has also had a total hip replacement in 2001 and revision in 2012 with excision of a bursa in 2012.  He has had bilateral knee arthroscopies in 2014.  In 1991 he had right wrist surgery and in 2011 left wrist surgery.  He has had his shoulders scoped bilaterally.  Laparoscopic cholecystectomy in 2011.  In 2009, renal calculi removal by cystoscopy.  In 2007, 2 neck surgeries.  He has also had a viral infection in 2013 and kidney stone in 2012, both without surgical intervention.   MEDICATIONS:  Include potassium 20 mEq daily, metoprolol 25 mg daily, omeprazole 40 mg daily, aspirin 325 mg daily, vitamin C 500 mg daily.    ALLERGIES:  SULFA, MYCINS, PHENCYCLINE, and MORPHINE which causes him hallucinations.  He has also has difficulty with SURGICAL TAPE and STERI-STRIPS.  Apparently he had a fairly large reaction with that.   FAMILY HISTORY:  Positive for mother who died at  age 54 from a virus.  She had heart disease and diabetes.  His father committed suicide at age 49.  His brother died at age 72 from throat cancer.  He has a 35 year old brother who is in good condition.  He has no sisters.   SOCIAL HISTORY:  [name] is a 79 year old white, married male, retired.  He does not smoke or drink or has done either of those in the past except for some beer.   FOURTEEN-POINT REVIEW OF SYSTEMS:  Positive for bilateral cataract excision.  He does wear glasses.  Also has decreased hearing.  He did have bronchitis in January 2014.  He does have PVCs and has used metoprolol for that benefit.  He does have asthmatic attacks when he is in the cold, damp, and also around cats.  History of pseudogout in the past.  Multiple episodes of renal calculi in 2009 and 2012.  He normally urinates possibly once a night at the most.  He did have migraines which are now gone, but he has had pains in his head and has had multiple studies, but no one can find out what the reason is.   PHYSICAL EXAMINATION:   General:  Today reveals a 79 year old white male, well developed, well nourished, alert, pleasant, cooperative, in severe distress secondary to right knee pain. Vital Signs:  Height is 6 feet 3 inches, weight 229 pounds.  BMI 28.6.  Temperature 98.2,  pulse 60, respirations 12, blood pressure 120/64. Head:  Normocephalic. Eyes:  Pupils equal, round, and reactive to light and accommodation with extraocular movements intact. Ears/Nose/Throat:  Benign. Neck:  Supple and no bruits. Chest:  Had good expansion. Lungs:  Essentially clear. Cardiac:  Had a regular rhythm and rate.  A little bradycardic, borderline.  No murmurs were noted. Pulses:  Were 2+, bilateral and symmetric, in the lower extremities. Abdomen:  Scaphoid, soft, nontender.  No masses palpable.  Normal bowel sounds present. Genital/Rectal/Breast:  Not indicated for an orthopedic evaluation. CNS:  He is oriented x3, and cranial  nerves II through XII grossly intact. Musculoskeletal:  He has range of motion from about 10 to 15 degrees shy of full extension to about 95 to 100 degrees.  He does have a palpable Baker cyst.  He has crepitance with range of motion.  He has a fairly large effusion at this time.  No warmth or erythema.  He does have some pseudolaxity with varus and valgus stressing.   RADIOGRAPHS:  Radiographic studies reveal bone-on-bone lateral compartment osteoarthritis with periarticular spurring both medial, lateral, and patellofemoral.  He has significant patellofemoral OA also with sclerosing and narrowing throughout all 3 compartments.  He does appear to have some chondral calcinosis noted especially in the medial compartment.   ASSESSMENT:  End-stage OA of the right knee.   PLAN:   At this time I have reviewed a clearance from Dr. Mare Ferrari who feels that from both a medical and cardiac standpoint that he would be able to survive the surgical intervention of a total knee replacement.  He will be available for cardiac issues, but we will have the hospitalist follow him for medical issues.  At this time I am going to proceed with a total knee replacement on the right knee.  He is seen today, and I explained the procedure, risks, and benefits in detail to him.  Explained about the hospital course as well as his rehabilitation and physical therapy.  Questions from his wife and himself were asked and answered.  Plan for procedure in the near future.   Mike Craze [place], [place] [phone]  03/25/2014 2:00 PM

## 2014-03-30 MED ORDER — CEFAZOLIN SODIUM-DEXTROSE 2-3 GM-% IV SOLR
2.0000 g | INTRAVENOUS | Status: AC
  Administered 2014-03-31: 2 g via INTRAVENOUS
  Filled 2014-03-30: qty 50

## 2014-03-30 MED ORDER — ACETAMINOPHEN 10 MG/ML IV SOLN
1000.0000 mg | Freq: Once | INTRAVENOUS | Status: AC
  Administered 2014-03-31: 1000 mg via INTRAVENOUS
  Filled 2014-03-30: qty 100

## 2014-03-31 ENCOUNTER — Inpatient Hospital Stay (HOSPITAL_COMMUNITY): Payer: Medicare Other | Admitting: Certified Registered Nurse Anesthetist

## 2014-03-31 ENCOUNTER — Encounter (HOSPITAL_COMMUNITY)
Admission: RE | Disposition: A | Discharge status: Home Health | Payer: Self-pay | Source: Ambulatory Visit | Attending: Orthopaedic Surgery

## 2014-03-31 ENCOUNTER — Encounter (HOSPITAL_COMMUNITY): Payer: Medicare Other | Admitting: Certified Registered Nurse Anesthetist

## 2014-03-31 ENCOUNTER — Inpatient Hospital Stay (HOSPITAL_COMMUNITY)
Admission: RE | Admit: 2014-03-31 | Discharge: 2014-04-04 | DRG: 470 | Disposition: A | Discharge status: Home Health | Payer: Medicare Other | Source: Ambulatory Visit | Attending: Orthopaedic Surgery | Admitting: Orthopaedic Surgery

## 2014-03-31 ENCOUNTER — Encounter (HOSPITAL_COMMUNITY): Payer: Self-pay | Admitting: Certified Registered Nurse Anesthetist

## 2014-03-31 DIAGNOSIS — R339 Retention of urine, unspecified: Secondary | ICD-10-CM | POA: Diagnosis not present

## 2014-03-31 DIAGNOSIS — Y831 Surgical operation with implant of artificial internal device as the cause of abnormal reaction of the patient, or of later complication, without mention of misadventure at the time of the procedure: Secondary | ICD-10-CM | POA: Diagnosis not present

## 2014-03-31 DIAGNOSIS — Z8249 Family history of ischemic heart disease and other diseases of the circulatory system: Secondary | ICD-10-CM

## 2014-03-31 DIAGNOSIS — M25569 Pain in unspecified knee: Secondary | ICD-10-CM | POA: Diagnosis not present

## 2014-03-31 DIAGNOSIS — Y9210 Unspecified residential institution as the place of occurrence of the external cause: Secondary | ICD-10-CM | POA: Diagnosis not present

## 2014-03-31 DIAGNOSIS — R338 Other retention of urine: Secondary | ICD-10-CM

## 2014-03-31 DIAGNOSIS — D62 Acute posthemorrhagic anemia: Secondary | ICD-10-CM | POA: Diagnosis not present

## 2014-03-31 DIAGNOSIS — IMO0002 Reserved for concepts with insufficient information to code with codable children: Secondary | ICD-10-CM | POA: Diagnosis not present

## 2014-03-31 DIAGNOSIS — K929 Disease of digestive system, unspecified: Secondary | ICD-10-CM | POA: Diagnosis not present

## 2014-03-31 DIAGNOSIS — G43909 Migraine, unspecified, not intractable, without status migrainosus: Secondary | ICD-10-CM | POA: Diagnosis present

## 2014-03-31 DIAGNOSIS — N401 Enlarged prostate with lower urinary tract symptoms: Secondary | ICD-10-CM | POA: Diagnosis present

## 2014-03-31 DIAGNOSIS — Z833 Family history of diabetes mellitus: Secondary | ICD-10-CM | POA: Diagnosis not present

## 2014-03-31 DIAGNOSIS — R110 Nausea: Secondary | ICD-10-CM | POA: Diagnosis not present

## 2014-03-31 DIAGNOSIS — J45909 Unspecified asthma, uncomplicated: Secondary | ICD-10-CM | POA: Diagnosis present

## 2014-03-31 DIAGNOSIS — Z882 Allergy status to sulfonamides status: Secondary | ICD-10-CM | POA: Diagnosis not present

## 2014-03-31 DIAGNOSIS — N138 Other obstructive and reflux uropathy: Secondary | ICD-10-CM | POA: Diagnosis present

## 2014-03-31 DIAGNOSIS — Z881 Allergy status to other antibiotic agents status: Secondary | ICD-10-CM

## 2014-03-31 DIAGNOSIS — Z87442 Personal history of urinary calculi: Secondary | ICD-10-CM | POA: Diagnosis not present

## 2014-03-31 DIAGNOSIS — K560 Paralytic ileus: Secondary | ICD-10-CM | POA: Diagnosis not present

## 2014-03-31 DIAGNOSIS — Z79899 Other long term (current) drug therapy: Secondary | ICD-10-CM | POA: Diagnosis not present

## 2014-03-31 DIAGNOSIS — Z7901 Long term (current) use of anticoagulants: Secondary | ICD-10-CM

## 2014-03-31 DIAGNOSIS — M1711 Unilateral primary osteoarthritis, right knee: Secondary | ICD-10-CM

## 2014-03-31 DIAGNOSIS — Z9089 Acquired absence of other organs: Secondary | ICD-10-CM | POA: Diagnosis not present

## 2014-03-31 DIAGNOSIS — K9189 Other postprocedural complications and disorders of digestive system: Secondary | ICD-10-CM

## 2014-03-31 DIAGNOSIS — R0989 Other specified symptoms and signs involving the circulatory and respiratory systems: Secondary | ICD-10-CM | POA: Diagnosis not present

## 2014-03-31 DIAGNOSIS — G8918 Other acute postprocedural pain: Secondary | ICD-10-CM | POA: Diagnosis not present

## 2014-03-31 DIAGNOSIS — N320 Bladder-neck obstruction: Secondary | ICD-10-CM | POA: Diagnosis present

## 2014-03-31 DIAGNOSIS — M1710 Unilateral primary osteoarthritis, unspecified knee: Principal | ICD-10-CM | POA: Diagnosis present

## 2014-03-31 DIAGNOSIS — K567 Ileus, unspecified: Secondary | ICD-10-CM | POA: Diagnosis not present

## 2014-03-31 DIAGNOSIS — I498 Other specified cardiac arrhythmias: Secondary | ICD-10-CM | POA: Diagnosis present

## 2014-03-31 DIAGNOSIS — Q5564 Hidden penis: Secondary | ICD-10-CM | POA: Diagnosis not present

## 2014-03-31 DIAGNOSIS — H9190 Unspecified hearing loss, unspecified ear: Secondary | ICD-10-CM | POA: Diagnosis present

## 2014-03-31 DIAGNOSIS — N9989 Other postprocedural complications and disorders of genitourinary system: Secondary | ICD-10-CM | POA: Diagnosis not present

## 2014-03-31 DIAGNOSIS — Z96659 Presence of unspecified artificial knee joint: Secondary | ICD-10-CM

## 2014-03-31 DIAGNOSIS — Z96649 Presence of unspecified artificial hip joint: Secondary | ICD-10-CM | POA: Diagnosis not present

## 2014-03-31 DIAGNOSIS — R141 Gas pain: Secondary | ICD-10-CM | POA: Diagnosis not present

## 2014-03-31 DIAGNOSIS — R143 Flatulence: Secondary | ICD-10-CM | POA: Diagnosis not present

## 2014-03-31 HISTORY — PX: TOTAL KNEE ARTHROPLASTY: SHX125

## 2014-03-31 SURGERY — ARTHROPLASTY, KNEE, TOTAL
Anesthesia: General | Site: Knee | Laterality: Right

## 2014-03-31 MED ORDER — FENTANYL CITRATE 0.05 MG/ML IJ SOLN
INTRAMUSCULAR | Status: DC | PRN
  Administered 2014-03-31 (×5): 50 ug via INTRAVENOUS

## 2014-03-31 MED ORDER — DOCUSATE SODIUM 100 MG PO CAPS
100.0000 mg | ORAL_CAPSULE | Freq: Two times a day (BID) | ORAL | Status: DC
  Administered 2014-03-31 – 2014-04-04 (×9): 100 mg via ORAL
  Filled 2014-03-31 (×9): qty 1

## 2014-03-31 MED ORDER — OXYCODONE HCL 5 MG PO TABS
ORAL_TABLET | ORAL | Status: AC
  Filled 2014-03-31: qty 1

## 2014-03-31 MED ORDER — FLEET ENEMA 7-19 GM/118ML RE ENEM: 1.0000 | ENEMA | Freq: Once | RECTAL | Status: AC | PRN

## 2014-03-31 MED ORDER — ALUM & MAG HYDROXIDE-SIMETH 200-200-20 MG/5ML PO SUSP: 30.0000 mL | ORAL | Status: DC | PRN

## 2014-03-31 MED ORDER — METHOCARBAMOL 1000 MG/10ML IJ SOLN
500.0000 mg | Freq: Four times a day (QID) | INTRAVENOUS | Status: DC | PRN
  Filled 2014-03-31: qty 5

## 2014-03-31 MED ORDER — ONDANSETRON HCL 4 MG/2ML IJ SOLN
INTRAMUSCULAR | Status: AC
  Filled 2014-03-31: qty 2

## 2014-03-31 MED ORDER — FENTANYL CITRATE 0.05 MG/ML IJ SOLN
INTRAMUSCULAR | Status: AC
  Filled 2014-03-31: qty 5

## 2014-03-31 MED ORDER — ONDANSETRON HCL 4 MG PO TABS
4.0000 mg | ORAL_TABLET | Freq: Four times a day (QID) | ORAL | Status: DC | PRN
  Administered 2014-04-02: 4 mg via ORAL
  Filled 2014-03-31: qty 1

## 2014-03-31 MED ORDER — KETOROLAC TROMETHAMINE 15 MG/ML IJ SOLN
15.0000 mg | Freq: Four times a day (QID) | INTRAMUSCULAR | Status: AC
Start: 2014-03-31 — End: 2014-03-31
  Administered 2014-03-31 (×2): 15 mg via INTRAVENOUS
  Filled 2014-03-31 (×2): qty 1

## 2014-03-31 MED ORDER — 0.9 % SODIUM CHLORIDE (POUR BTL) OPTIME
TOPICAL | Status: DC | PRN
  Administered 2014-03-31: 1000 mL

## 2014-03-31 MED ORDER — HYDROMORPHONE HCL PF 1 MG/ML IJ SOLN
0.2500 mg | INTRAMUSCULAR | Status: DC | PRN
  Administered 2014-03-31 (×4): 0.5 mg via INTRAVENOUS

## 2014-03-31 MED ORDER — CEFAZOLIN SODIUM-DEXTROSE 2-3 GM-% IV SOLR
2.0000 g | Freq: Four times a day (QID) | INTRAVENOUS | Status: AC
  Administered 2014-03-31 (×2): 2 g via INTRAVENOUS
  Filled 2014-03-31 (×2): qty 50

## 2014-03-31 MED ORDER — GLYCOPYRROLATE 0.2 MG/ML IJ SOLN
INTRAMUSCULAR | Status: DC | PRN
  Administered 2014-03-31: 0.6 mg via INTRAVENOUS
  Administered 2014-03-31: 0.2 mg via INTRAVENOUS
  Administered 2014-03-31: 0.4 mg via INTRAVENOUS

## 2014-03-31 MED ORDER — ROCURONIUM BROMIDE 100 MG/10ML IV SOLN
INTRAVENOUS | Status: DC | PRN
  Administered 2014-03-31: 50 mg via INTRAVENOUS

## 2014-03-31 MED ORDER — SODIUM CHLORIDE 0.9 % IV SOLN: INTRAVENOUS | Status: DC

## 2014-03-31 MED ORDER — PROMETHAZINE HCL 25 MG/ML IJ SOLN: 6.2500 mg | INTRAMUSCULAR | Status: DC | PRN

## 2014-03-31 MED ORDER — PANTOPRAZOLE SODIUM 40 MG PO TBEC
80.0000 mg | DELAYED_RELEASE_TABLET | Freq: Every day | ORAL | Status: DC
  Administered 2014-03-31 – 2014-04-04 (×5): 80 mg via ORAL
  Filled 2014-03-31 (×5): qty 2

## 2014-03-31 MED ORDER — HYDROMORPHONE HCL PF 1 MG/ML IJ SOLN
0.5000 mg | INTRAMUSCULAR | Status: DC | PRN
  Administered 2014-04-02: 02:00:00 via INTRAVENOUS
  Administered 2014-04-02 (×2): 0.5 mg via INTRAVENOUS
  Filled 2014-03-31 (×3): qty 1

## 2014-03-31 MED ORDER — NEOSTIGMINE METHYLSULFATE 10 MG/10ML IV SOLN
INTRAVENOUS | Status: DC | PRN
  Administered 2014-03-31: 3 mg via INTRAVENOUS
  Administered 2014-03-31: 2 mg via INTRAVENOUS

## 2014-03-31 MED ORDER — CHLORHEXIDINE GLUCONATE 4 % EX LIQD
60.0000 mL | Freq: Every day | CUTANEOUS | Status: DC
  Filled 2014-03-31: qty 60

## 2014-03-31 MED ORDER — PHENOL 1.4 % MT LIQD: 1.0000 | OROMUCOSAL | Status: DC | PRN

## 2014-03-31 MED ORDER — HYDROMORPHONE HCL PF 1 MG/ML IJ SOLN
INTRAMUSCULAR | Status: AC
  Administered 2014-03-31: 0.5 mg via INTRAVENOUS
  Filled 2014-03-31: qty 1

## 2014-03-31 MED ORDER — ONDANSETRON HCL 4 MG/2ML IJ SOLN: 4.0000 mg | Freq: Four times a day (QID) | INTRAMUSCULAR | Status: DC | PRN

## 2014-03-31 MED ORDER — BUPIVACAINE-EPINEPHRINE (PF) 0.25% -1:200000 IJ SOLN
INTRAMUSCULAR | Status: AC
  Filled 2014-03-31: qty 30

## 2014-03-31 MED ORDER — BUPIVACAINE-EPINEPHRINE (PF) 0.5% -1:200000 IJ SOLN
INTRAMUSCULAR | Status: DC | PRN
  Administered 2014-03-31: 30 mL via PERINEURAL

## 2014-03-31 MED ORDER — ACETAMINOPHEN 325 MG PO TABS
650.0000 mg | ORAL_TABLET | Freq: Four times a day (QID) | ORAL | Status: DC | PRN
  Administered 2014-04-03 – 2014-04-04 (×2): 650 mg via ORAL
  Filled 2014-03-31 (×2): qty 2

## 2014-03-31 MED ORDER — PROPOFOL 10 MG/ML IV BOLUS
INTRAVENOUS | Status: DC | PRN
  Administered 2014-03-31: 160 mg via INTRAVENOUS

## 2014-03-31 MED ORDER — GLYCOPYRROLATE 0.2 MG/ML IJ SOLN
INTRAMUSCULAR | Status: AC
  Filled 2014-03-31: qty 5

## 2014-03-31 MED ORDER — POTASSIUM CHLORIDE CRYS ER 20 MEQ PO TBCR
20.0000 meq | EXTENDED_RELEASE_TABLET | Freq: Every day | ORAL | Status: DC
  Administered 2014-03-31 – 2014-04-04 (×5): 20 meq via ORAL
  Filled 2014-03-31 (×6): qty 1

## 2014-03-31 MED ORDER — METOCLOPRAMIDE HCL 5 MG/ML IJ SOLN: 5.0000 mg | Freq: Three times a day (TID) | INTRAMUSCULAR | Status: DC | PRN

## 2014-03-31 MED ORDER — OXYCODONE HCL 5 MG PO TABS
5.0000 mg | ORAL_TABLET | Freq: Once | ORAL | Status: AC | PRN
  Administered 2014-03-31: 5 mg via ORAL

## 2014-03-31 MED ORDER — BISACODYL 10 MG RE SUPP: 10.0000 mg | Freq: Every day | RECTAL | Status: DC | PRN

## 2014-03-31 MED ORDER — ONDANSETRON HCL 4 MG/2ML IJ SOLN
INTRAMUSCULAR | Status: DC | PRN
  Administered 2014-03-31: 4 mg via INTRAVENOUS

## 2014-03-31 MED ORDER — METOPROLOL TARTRATE 25 MG PO TABS
25.0000 mg | ORAL_TABLET | Freq: Every day | ORAL | Status: DC
  Administered 2014-04-01 – 2014-04-04 (×4): 25 mg via ORAL
  Filled 2014-03-31 (×5): qty 1

## 2014-03-31 MED ORDER — LACTATED RINGERS IV SOLN
INTRAVENOUS | Status: DC | PRN
  Administered 2014-03-31 (×2): via INTRAVENOUS

## 2014-03-31 MED ORDER — ACETAMINOPHEN 650 MG RE SUPP: 650.0000 mg | Freq: Four times a day (QID) | RECTAL | Status: DC | PRN

## 2014-03-31 MED ORDER — OXYCODONE HCL 5 MG PO TABS
5.0000 mg | ORAL_TABLET | ORAL | Status: DC | PRN
  Administered 2014-03-31 (×2): 10 mg via ORAL
  Administered 2014-03-31: 5 mg via ORAL
  Administered 2014-03-31 – 2014-04-02 (×9): 10 mg via ORAL
  Filled 2014-03-31 (×12): qty 2

## 2014-03-31 MED ORDER — ROCURONIUM BROMIDE 50 MG/5ML IV SOLN
INTRAVENOUS | Status: AC
  Filled 2014-03-31: qty 1

## 2014-03-31 MED ORDER — METHOCARBAMOL 500 MG PO TABS
500.0000 mg | ORAL_TABLET | Freq: Four times a day (QID) | ORAL | Status: DC | PRN
  Administered 2014-03-31 – 2014-04-04 (×7): 500 mg via ORAL
  Filled 2014-03-31 (×7): qty 1

## 2014-03-31 MED ORDER — CHLORHEXIDINE GLUCONATE 4 % EX LIQD
60.0000 mL | Freq: Once | CUTANEOUS | Status: DC
  Filled 2014-03-31: qty 60

## 2014-03-31 MED ORDER — RIVAROXABAN 10 MG PO TABS
10.0000 mg | ORAL_TABLET | ORAL | Status: DC
Start: 2014-03-31 — End: 2014-04-04
  Administered 2014-03-31 – 2014-04-03 (×4): 10 mg via ORAL
  Filled 2014-03-31 (×5): qty 1

## 2014-03-31 MED ORDER — LIDOCAINE HCL (CARDIAC) 20 MG/ML IV SOLN
INTRAVENOUS | Status: DC | PRN
  Administered 2014-03-31: 100 mg via INTRAVENOUS

## 2014-03-31 MED ORDER — EPHEDRINE SULFATE 50 MG/ML IJ SOLN
INTRAMUSCULAR | Status: AC
  Filled 2014-03-31: qty 1

## 2014-03-31 MED ORDER — BUPIVACAINE-EPINEPHRINE 0.25% -1:200000 IJ SOLN
INTRAMUSCULAR | Status: DC | PRN
  Administered 2014-03-31: 30 mL

## 2014-03-31 MED ORDER — MIDAZOLAM HCL 5 MG/5ML IJ SOLN
INTRAMUSCULAR | Status: DC | PRN
  Administered 2014-03-31 (×2): 1 mg via INTRAVENOUS

## 2014-03-31 MED ORDER — ARTIFICIAL TEARS OP OINT
TOPICAL_OINTMENT | OPHTHALMIC | Status: DC | PRN
  Administered 2014-03-31: 1 via OPHTHALMIC

## 2014-03-31 MED ORDER — ACETAMINOPHEN 10 MG/ML IV SOLN
1000.0000 mg | Freq: Four times a day (QID) | INTRAVENOUS | Status: AC
  Administered 2014-03-31 – 2014-04-01 (×4): 1000 mg via INTRAVENOUS
  Filled 2014-03-31 (×7): qty 100

## 2014-03-31 MED ORDER — MENTHOL 3 MG MT LOZG: 1.0000 | LOZENGE | OROMUCOSAL | Status: DC | PRN

## 2014-03-31 MED ORDER — ARTIFICIAL TEARS OP OINT
TOPICAL_OINTMENT | OPHTHALMIC | Status: AC
  Filled 2014-03-31: qty 3.5

## 2014-03-31 MED ORDER — MAGNESIUM HYDROXIDE 400 MG/5ML PO SUSP: 30.0000 mL | Freq: Every day | ORAL | Status: DC | PRN

## 2014-03-31 MED ORDER — STERILE WATER FOR INJECTION IJ SOLN
INTRAMUSCULAR | Status: AC
  Filled 2014-03-31: qty 10

## 2014-03-31 MED ORDER — NEOSTIGMINE METHYLSULFATE 10 MG/10ML IV SOLN
INTRAVENOUS | Status: AC
  Filled 2014-03-31: qty 1

## 2014-03-31 MED ORDER — SODIUM CHLORIDE 0.9 % IV SOLN
75.0000 mL/h | INTRAVENOUS | Status: DC
  Administered 2014-03-31 – 2014-04-03 (×2): 75 mL/h via INTRAVENOUS

## 2014-03-31 MED ORDER — MIDAZOLAM HCL 2 MG/2ML IJ SOLN
INTRAMUSCULAR | Status: AC
  Filled 2014-03-31: qty 2

## 2014-03-31 MED ORDER — PROPOFOL 10 MG/ML IV BOLUS
INTRAVENOUS | Status: AC
  Filled 2014-03-31: qty 20

## 2014-03-31 MED ORDER — SODIUM CHLORIDE 0.9 % IR SOLN
Status: DC | PRN
  Administered 2014-03-31 (×2): 1000 mL

## 2014-03-31 MED ORDER — DEXAMETHASONE SODIUM PHOSPHATE 10 MG/ML IJ SOLN
INTRAMUSCULAR | Status: DC | PRN
  Administered 2014-03-31: 4 mg via INTRAVENOUS

## 2014-03-31 MED ORDER — HYDROMORPHONE HCL PF 1 MG/ML IJ SOLN
INTRAMUSCULAR | Status: AC
  Filled 2014-03-31: qty 1

## 2014-03-31 MED ORDER — OXYCODONE HCL 5 MG/5ML PO SOLN: 5.0000 mg | Freq: Once | ORAL | Status: AC | PRN

## 2014-03-31 MED ORDER — METOCLOPRAMIDE HCL 10 MG PO TABS
5.0000 mg | ORAL_TABLET | Freq: Three times a day (TID) | ORAL | Status: DC | PRN
  Administered 2014-04-01 – 2014-04-02 (×2): 10 mg via ORAL
  Filled 2014-03-31 (×2): qty 1

## 2014-03-31 MED ORDER — LIDOCAINE HCL (CARDIAC) 20 MG/ML IV SOLN
INTRAVENOUS | Status: AC
  Filled 2014-03-31: qty 5

## 2014-03-31 SURGICAL SUPPLY — 60 items
BANDAGE ESMARK 6X9 LF (GAUZE/BANDAGES/DRESSINGS) ×1 IMPLANT
BLADE SAGITTAL 25.0X1.19X90 (BLADE) ×2 IMPLANT
BNDG CMPR 9X6 STRL LF SNTH (GAUZE/BANDAGES/DRESSINGS) ×1
BNDG ESMARK 6X9 LF (GAUZE/BANDAGES/DRESSINGS) ×2
BOWL SMART MIX CTS (DISPOSABLE) ×2 IMPLANT
CAPT RP KNEE ×1 IMPLANT
CEMENT HV SMART SET (Cement) ×4 IMPLANT
COVER BACK TABLE 24X17X13 BIG (DRAPES) ×2 IMPLANT
COVER SURGICAL LIGHT HANDLE (MISCELLANEOUS) ×2 IMPLANT
CUFF TOURNIQUET SINGLE 34IN LL (TOURNIQUET CUFF) ×1 IMPLANT
DRAPE EXTREMITY T 121X128X90 (DRAPE) ×2 IMPLANT
DRAPE PROXIMA HALF (DRAPES) ×2 IMPLANT
DRSG ADAPTIC 3X8 NADH LF (GAUZE/BANDAGES/DRESSINGS) ×1 IMPLANT
DURAPREP 26ML APPLICATOR (WOUND CARE) ×2 IMPLANT
ELECT CAUTERY BLADE 6.4 (BLADE) ×2 IMPLANT
ELECT REM PT RETURN 9FT ADLT (ELECTROSURGICAL) ×2
ELECTRODE REM PT RTRN 9FT ADLT (ELECTROSURGICAL) ×1 IMPLANT
EVACUATOR 1/8 PVC DRAIN (DRAIN) ×1 IMPLANT
FACESHIELD WRAPAROUND (MASK) ×4 IMPLANT
FACESHIELD WRAPAROUND OR TEAM (MASK) ×2 IMPLANT
FLOSEAL 10ML (HEMOSTASIS) IMPLANT
GLOVE BIOGEL PI IND STRL 8 (GLOVE) ×1 IMPLANT
GLOVE BIOGEL PI IND STRL 8.5 (GLOVE) ×1 IMPLANT
GLOVE BIOGEL PI INDICATOR 8 (GLOVE) ×1
GLOVE BIOGEL PI INDICATOR 8.5 (GLOVE) ×2
GLOVE ECLIPSE 8.0 STRL XLNG CF (GLOVE) ×5 IMPLANT
GLOVE SURG ORTHO 8.5 STRL (GLOVE) ×5 IMPLANT
GOWN STRL REUS W/ TWL LRG LVL3 (GOWN DISPOSABLE) ×2 IMPLANT
GOWN STRL REUS W/TWL 2XL LVL3 (GOWN DISPOSABLE) ×2 IMPLANT
GOWN STRL REUS W/TWL LRG LVL3 (GOWN DISPOSABLE) ×4
HANDPIECE INTERPULSE COAX TIP (DISPOSABLE) ×2
KIT BASIN OR (CUSTOM PROCEDURE TRAY) ×2 IMPLANT
KIT ROOM TURNOVER OR (KITS) ×2 IMPLANT
MANIFOLD NEPTUNE II (INSTRUMENTS) ×2 IMPLANT
NEEDLE 22X1 1/2 (OR ONLY) (NEEDLE) ×1 IMPLANT
NS IRRIG 1000ML POUR BTL (IV SOLUTION) ×2 IMPLANT
PACK TOTAL JOINT (CUSTOM PROCEDURE TRAY) ×2 IMPLANT
PAD ABD 8X10 STRL (GAUZE/BANDAGES/DRESSINGS) ×1 IMPLANT
PAD ARMBOARD 7.5X6 YLW CONV (MISCELLANEOUS) ×4 IMPLANT
PAD CAST 4YDX4 CTTN HI CHSV (CAST SUPPLIES) IMPLANT
PADDING CAST COTTON 4X4 STRL (CAST SUPPLIES) ×2
PADDING CAST COTTON 6X4 STRL (CAST SUPPLIES) ×1 IMPLANT
SET HNDPC FAN SPRY TIP SCT (DISPOSABLE) ×1 IMPLANT
SPONGE GAUZE 4X4 12PLY STER LF (GAUZE/BANDAGES/DRESSINGS) ×1 IMPLANT
STAPLER VISISTAT 35W (STAPLE) ×1 IMPLANT
SUCTION FRAZIER TIP 10 FR DISP (SUCTIONS) ×2 IMPLANT
SUT BONE WAX W31G (SUTURE) ×2 IMPLANT
SUT ETHIBOND NAB CT1 #1 30IN (SUTURE) ×6 IMPLANT
SUT MNCRL AB 3-0 PS2 18 (SUTURE) ×2 IMPLANT
SUT VIC AB 0 CT1 27 (SUTURE) ×2
SUT VIC AB 0 CT1 27XBRD ANBCTR (SUTURE) ×1 IMPLANT
SUT VIC AB 1 CT1 27 (SUTURE) ×2
SUT VIC AB 1 CT1 27XBRD ANBCTR (SUTURE) ×1 IMPLANT
SYR CONTROL 10ML LL (SYRINGE) ×1 IMPLANT
TOWEL OR 17X24 6PK STRL BLUE (TOWEL DISPOSABLE) ×2 IMPLANT
TOWEL OR 17X26 10 PK STRL BLUE (TOWEL DISPOSABLE) ×2 IMPLANT
TRAY FOLEY CATH 16FRSI W/METER (SET/KITS/TRAYS/PACK) ×1 IMPLANT
TUBE CONNECTING 20X1/4 (TUBING) ×1 IMPLANT
WATER STERILE IRR 1000ML POUR (IV SOLUTION) ×3 IMPLANT
WRAP KNEE MAXI GEL POST OP (GAUZE/BANDAGES/DRESSINGS) ×2 IMPLANT

## 2014-03-31 NOTE — Evaluation (Signed)
Physical Therapy Evaluation Patient Details Name: [name] MRN: [id number] DOB: Jan 12, 1935 Today's Date: 03/31/2014   History of Present Illness  Admitted for elective R TKA  Clinical Impression  Pt admitted with/for elective R TKA.  Pt currently limited functionally due to the problems listed below.  (see problems list.)  Pt will benefit from PT to maximize function and safety to be able to get home safely with available assist of family.     Follow Up Recommendations Home health PT    Equipment Recommendations  None recommended by PT    Recommendations for Other Services       Precautions / Restrictions Precautions Precautions: Knee Precaution Booklet Issued: Yes (comment) Restrictions RLE Weight Bearing: Partial weight bearing RLE Partial Weight Bearing Percentage or Pounds: 50%      Mobility  Bed Mobility Overal bed mobility: Needs Assistance Bed Mobility: Supine to Sit     Supine to sit: Min guard     General bed mobility comments: cued for good technique  Transfers Overall transfer level: Needs assistance Equipment used: Rolling walker (2 wheeled) Transfers: Sit to/from Stand Sit to Stand: Min guard         General transfer comment: cued for good hand placement; no assist  Ambulation/Gait Ambulation/Gait assistance: Min guard Ambulation Distance (Feet): 15 Feet Assistive device: Rolling walker (2 wheeled) Gait Pattern/deviations: Step-to pattern     General Gait Details: Unable to accept weight without nerve block causing knee buckle R LE  Stairs            Wheelchair Mobility    Modified Rankin (Stroke Patients Only)       Balance Overall balance assessment: No apparent balance deficits (not formally assessed)                                           Pertinent Vitals/Pain Nerve blocked    Home Living Family/patient expects to be discharged to:: Private residence Living Arrangements:  Spouse/significant other Available Help at Discharge: Family Type of Home: House Home Access: Stairs to enter Entrance Stairs-Rails: Psychiatric nurse of Steps: 2 Home Layout: One level Home Equipment: Environmental consultant - 2 wheels;Bedside commode;Cane - single point (shower with seat and grab bars, high toilet with grab bar)      Prior Function Level of Independence: Independent               Hand Dominance        Extremity/Trunk Assessment               Lower Extremity Assessment: Overall WFL for tasks assessed;RLE deficits/detail RLE Deficits / Details: flexs against gravity; assisted to >90* and ext -5*       Communication   Communication: No difficulties  Cognition Arousal/Alertness: Awake/alert Behavior During Therapy: WFL for tasks assessed/performed Overall Cognitive Status: Within Functional Limits for tasks assessed                      General Comments      Exercises Total Joint Exercises Ankle Circles/Pumps: AROM;20 reps;Supine Quad Sets: AROM;Both;10 reps;Supine Short Arc Quad: AROM;Right;5 reps;Supine Heel Slides: Right;10 reps;Supine;AAROM Hip ABduction/ADduction: AAROM;Right;10 reps;Supine Straight Leg Raises: AROM;Right;10 reps;Supine Knee Flexion: AAROM;Right;5 reps;Seated Goniometric ROM: ~95*      Assessment/Plan    PT Assessment Patient needs continued PT services  PT Diagnosis Difficulty walking;Generalized weakness   PT  Problem List Decreased strength;Decreased range of motion;Decreased activity tolerance;Decreased mobility;Decreased knowledge of use of DME;Pain  PT Treatment Interventions DME instruction;Gait training;Stair training;Functional mobility training;Therapeutic activities;Patient/family education   PT Goals (Current goals can be found in the Care Plan section) Acute Rehab PT Goals Patient Stated Goal: Back independent and activie PT Goal Formulation: With patient Time For Goal Achievement:  04/07/14 Potential to Achieve Goals: Good    Frequency 7X/week   Barriers to discharge        Co-evaluation               End of Session   Activity Tolerance: Patient tolerated treatment well Patient left: in chair;with call bell/phone within reach;with family/visitor present Nurse Communication: Mobility status         Time: 7035-0093 PT Time Calculation (min): 41 min   Charges:   PT Evaluation $Initial PT Evaluation Tier I: 1 Procedure PT Treatments $Gait Training: 8-22 mins $Therapeutic Exercise: 8-22 mins   PT G Codes:          [name], [name] 03/31/2014, 5:48 PM 03/31/2014  [name], [name] [phone]  (pager)

## 2014-03-31 NOTE — Care Management Note (Signed)
CARE MANAGEMENT NOTE 03/31/2014  Patient:  [name], [name]   Account Number:  [id number]  Date Initiated:  03/31/2014  Documentation initiated by:  Ricki Miller  Subjective/Objective Assessment:   79 yr old male s/p right total knee arthroplasty.     Action/Plan:   PT/OT eval  patient preoperatively setup with [facility]  Case Manager will continue to monitor.   Anticipated DC Date:  04/02/2014   Anticipated DC Plan:  [place]  CM consult      Choice offered to / List presented to:             Status of service:  In process, will continue to follow  Per UR Regulation:  Reviewed for med. necessity/level of care/duration of stay

## 2014-03-31 NOTE — Progress Notes (Signed)
Patient ID: [name], male   DOB: 05/02/1935, 79 y.o.   MRN: [id number] The recent History & Physical has been reviewed. I have personally examined the patient today. There is no interval change to the documented History & Physical. The patient would like to proceed with the procedure.  [name] W 03/31/2014,  7:17 AM

## 2014-03-31 NOTE — Progress Notes (Signed)
Utilization review completed.  

## 2014-03-31 NOTE — Progress Notes (Signed)
Orthopedic Tech Progress Note Patient Details:  [name] 07/25/1935 [id number] CPM applied to RLE with appropriate settings. OHF applied to bed.  CPM Right Knee CPM Right Knee: On Right Knee Flexion (Degrees): 60 Right Knee Extension (Degrees): 0   [name] 03/31/2014, 10:49 AM

## 2014-03-31 NOTE — Progress Notes (Signed)
Orthopedic Tech Progress Note Patient Details:  [name] [birth year] [id number] On cpm at 8:00 pm RLE -[id number] Patient ID: [name], male   DOB: Mar 24, 1935, 79 y.o.   MRN: [id number]   Braulio Bosch 03/31/2014, 8:00 PM

## 2014-03-31 NOTE — Transfer of Care (Signed)
Immediate Anesthesia Transfer of Care Note  Patient: [name]  Procedure(s) Performed: Procedure(s): TOTAL KNEE ARTHROPLASTY (Right)  Patient Location: PACU  Anesthesia Type:GA combined with regional for post-op pain  Level of Consciousness: awake, alert  and oriented  Airway & Oxygen Therapy: Patient Spontanous Breathing and Patient connected to nasal cannula oxygen  Post-op Assessment: Report given to PACU RN and Post -op Vital signs reviewed and stable  Post vital signs: Reviewed and stable  Complications: No apparent anesthesia complications

## 2014-03-31 NOTE — Discharge Instructions (Signed)
Information on my medicine - XARELTO (Rivaroxaban)  This medication education was reviewed with me or my healthcare representative as part of my discharge preparation.  The pharmacist that spoke with me during my hospital stay was:  Erik Obey, [facility]  Why was Xarelto prescribed for you? Xarelto was prescribed for you to reduce the risk of blood clots forming after orthopedic surgery. The medical term for these abnormal blood clots is venous thromboembolism (VTE).  What do you need to know about xarelto ? Take your Xarelto ONCE DAILY at the same time every day. You may take it either with or without food.  If you have difficulty swallowing the tablet whole, you may crush it and mix in applesauce just prior to taking your dose.  Take Xarelto exactly as prescribed by your doctor and DO NOT stop taking Xarelto without talking to the doctor who prescribed the medication.  Stopping without other VTE prevention medication to take the place of Xarelto may increase your risk of developing a clot.  After discharge, you should have regular check-up appointments with your healthcare provider that is prescribing your Xarelto.    What do you do if you miss a dose? If you miss a dose, take it as soon as you remember on the same day then continue your regularly scheduled once daily regimen the next day. Do not take two doses of Xarelto on the same day.   Important Safety Information A possible side effect of Xarelto is bleeding. You should call your healthcare provider right away if you experience any of the following:   Bleeding from an injury or your nose that does not stop.   Unusual colored urine (red or dark brown) or unusual colored stools (red or black).   Unusual bruising for unknown reasons.   A serious fall or if you hit your head (even if there is no bleeding).  Some medicines may interact with Xarelto and might increase your risk of bleeding while on Xarelto. To help avoid  this, consult your healthcare provider or pharmacist prior to using any new prescription or non-prescription medications, including herbals, vitamins, non-steroidal anti-inflammatory drugs (NSAIDs) and supplements.  This website has more information on Xarelto: [email or web address]

## 2014-03-31 NOTE — Anesthesia Procedure Notes (Addendum)
Anesthesia Regional Block:  Femoral nerve block  Pre-Anesthetic Checklist: ,, timeout performed, Correct Patient, Correct Site, Correct Laterality, Correct Procedure, Correct Position, site marked, Risks and benefits discussed, at surgeon's request and post-op pain management  Laterality: Right and Upper  Prep: chloraprep       Needles:  Injection technique: Single-shot  Needle Type: Echogenic Needle     Needle Length: 9cm 9 cm Needle Gauge: 22 and 22 G  Needle insertion depth: 5 cm   Additional Needles:  Procedures: ultrasound guided (picture in chart) and nerve stimulator Femoral nerve block  Nerve Stimulator or Paresthesia:  Response: Twitch elicited, 0.8 mA,   Additional Responses:   Narrative:  Start time: 03/31/2014 7:05 AM End time: 03/31/2014 7:20 AM Injection made incrementally with aspirations every 5 mL.  Performed by: Personally  Anesthesiologist: Bartolo Darter, MD  Additional Notes: BP cuff, EKG monitors applied. Sedation begun. Femoral artery palpated for location of nerve. After nerve location anesthetic injected incrementally, slowly , and after neg aspirations. Tolerated well.   Procedure Name: Intubation Date/Time: 03/31/2014 7:35 AM Performed by: Ollen Bowl Pre-anesthesia Checklist: Patient identified, Timeout performed, Emergency Drugs available, Suction available and Patient being monitored Patient Re-evaluated:Patient Re-evaluated prior to inductionOxygen Delivery Method: Circle system utilized and Simple face mask Preoxygenation: Pre-oxygenation with 100% oxygen Intubation Type: IV induction Ventilation: Mask ventilation without difficulty Laryngoscope Size: Miller and 3 Grade View: Grade I Tube type: Oral Tube size: 7.5 mm Number of attempts: 1 Airway Equipment and Method: Patient positioned with wedge pillow and Stylet Placement Confirmation: ETT inserted through vocal cords under direct vision,  positive ETCO2 and breath sounds  checked- equal and bilateral Secured at: 23 cm Tube secured with: Tape Dental Injury: Teeth and Oropharynx as per pre-operative assessment

## 2014-03-31 NOTE — Plan of Care (Signed)
Problem: Consults Goal: Diagnosis- Total Joint Replacement Primary Total Knee     

## 2014-03-31 NOTE — Anesthesia Postprocedure Evaluation (Signed)
  Anesthesia Post-op Note  Patient: [name]  Procedure(s) Performed: Procedure(s): TOTAL KNEE ARTHROPLASTY (Right)  Patient Location: PACU  Anesthesia Type:GA combined with regional for post-op pain  Level of Consciousness: awake and alert   Airway and Oxygen Therapy: Patient Spontanous Breathing  Post-op Pain: mild  Post-op Assessment: Post-op Vital signs reviewed and Pain level controlled  Post-op Vital Signs: stable  Last Vitals:  Filed Vitals:   03/31/14 1125  BP:   Pulse: 55  Temp: 36.3 C  Resp: 15    Complications: No apparent anesthesia complications

## 2014-03-31 NOTE — Op Note (Signed)
PATIENT ID:      LOYDE ORTH  MRN:     [id number] DOB/AGE:    03-31-1935 / 79 y.o.       OPERATIVE REPORT    DATE OF PROCEDURE:  03/31/2014       PREOPERATIVE DIAGNOSIS:   RIGHT KNEE OSTEOARTHRITIS-END STAGE                                                       Estimated body mass index is 28.01 kg/(m^2) as calculated from the following:   Height as of this encounter: 6\' 4"  (1.93 m).   Weight as of this encounter: 104.327 kg (230 lb).     POSTOPERATIVE DIAGNOSIS:   RIGHT KNEE OSTEOARTHRITIS-END STAGE                                                                    Estimated body mass index is 28.01 kg/(m^2) as calculated from the following:   Height as of this encounter: 6\' 4"  (1.93 m).   Weight as of this encounter: 104.327 kg (230 lb).     PROCEDURE:  Procedure(s):RIGHT TOTAL KNEE ARTHROPLASTY      SURGEON:  [name], MD    ASSISTANT:   Biagio Borg, PA-C   (Present and scrubbed throughout the case, critical for assistance with exposure, retraction, instrumentation, and closure.)          ANESTHESIA: regional and general     DRAINS: (RIGHT KNEE) Hemovact drain(s) in the CLAMPED with  Suction Clamped :      TOURNIQUET TIME:  Total Tourniquet Time Documented: Thigh (Right) - 96 minutes Total: Thigh (Right) - 96 minutes     COMPLICATIONS:  None   CONDITION:  stable  PROCEDURE IN [id number]    [name] W 03/31/2014, 9:43 AM

## 2014-03-31 NOTE — Anesthesia Preprocedure Evaluation (Addendum)
Anesthesia Evaluation  Patient identified by MRN, date of birth, ID band Patient awake    Reviewed: Allergy & Precautions, H&P , NPO status , Patient's Chart, lab work & pertinent test results, reviewed documented beta blocker date and time   Airway Mallampati: II TM Distance: >3 FB Neck ROM: Full    Dental  (+) Teeth Intact   Pulmonary asthma , Recent URI , Resolved,          Cardiovascular negative cardio ROS      Neuro/Psych negative neurological ROS     GI/Hepatic negative GI ROS, Neg liver ROS,   Endo/Other  negative endocrine ROS  Renal/GU      Musculoskeletal negative musculoskeletal ROS (+)   Abdominal   Peds  Hematology   Anesthesia Other Findings   Reproductive/Obstetrics                          Anesthesia Physical Anesthesia Plan  ASA: II  Anesthesia Plan:    Post-op Pain Management:    Induction: Intravenous  Airway Management Planned: Oral ETT  Additional Equipment:   Intra-op Plan:   Post-operative Plan: Extubation in OR  Informed Consent: I have reviewed the patients History and Physical, chart, labs and discussed the procedure including the risks, benefits and alternatives for the proposed anesthesia with the patient or authorized representative who has indicated his/her understanding and acceptance.     Plan Discussed with:   Anesthesia Plan Comments:         Anesthesia Quick Evaluation

## 2014-04-01 ENCOUNTER — Encounter (HOSPITAL_COMMUNITY): Payer: Self-pay | Admitting: Orthopaedic Surgery

## 2014-04-01 DIAGNOSIS — M1711 Unilateral primary osteoarthritis, right knee: Secondary | ICD-10-CM | POA: Diagnosis present

## 2014-04-01 LAB — BASIC METABOLIC PANEL
ANION GAP: 12 (ref 5–15)
BUN: 13 mg/dL (ref 6–23)
CHLORIDE: 102 meq/L (ref 96–112)
CO2: 27 mEq/L (ref 19–32)
Calcium: 8.3 mg/dL — ABNORMAL LOW (ref 8.4–10.5)
Creatinine, Ser: 0.8 mg/dL (ref 0.50–1.35)
GFR calc non Af Amer: 85 mL/min — ABNORMAL LOW (ref >90)
Glucose, Bld: 125 mg/dL — ABNORMAL HIGH (ref 70–99)
POTASSIUM: 4.4 meq/L (ref 3.7–5.3)
SODIUM: 141 meq/L (ref 137–147)

## 2014-04-01 LAB — CBC
HCT: 36.7 % — ABNORMAL LOW (ref 39.0–52.0)
Hemoglobin: 12.5 g/dL — ABNORMAL LOW (ref 13.0–17.0)
MCH: 31.2 pg (ref 26.0–34.0)
MCHC: 34.1 g/dL (ref 30.0–36.0)
MCV: 91.5 fL (ref 78.0–100.0)
Platelets: 115 10*3/uL — ABNORMAL LOW (ref 150–400)
RBC: 4.01 MIL/uL — ABNORMAL LOW (ref 4.22–5.81)
RDW: 13.3 % (ref 11.5–15.5)
WBC: 11.7 10*3/uL — AB (ref 4.0–10.5)

## 2014-04-01 NOTE — Progress Notes (Signed)
Seen and agreed 04/01/2014 Jacqualyn Posey PTA [phone] pager [phone] office

## 2014-04-01 NOTE — Evaluation (Signed)
Occupational Therapy Evaluation and Discharge Patient Details Name: [name] MRN: [id number] DOB: Jul 08, 1935 Today's Date: 04/01/2014    History of Present Illness Admitted for elective R TKA   Clinical Impression   Pta was independent with ADLs and now presents with generalized weakness and limited knee ROM interfering with his independence with self care tasks. Educated pt on LB ADL compensatory strategies and AE to assist with LB bathing/dressing, shower transfer to a shower seat and home safety. Pt will have 24/7 supervision at home to assist with ADL/IADLs if needed and feels confident in self care tasks, therefore no further OT is needed. We will sign off.    Follow Up Recommendations  Supervision/Assistance - 24 hour    Equipment Recommendations   (pt has all DME needs)      Precautions / Restrictions Precautions Precautions: Knee Precaution Comments: reviewed precautions with pt Restrictions Weight Bearing Restrictions: Yes RLE Weight Bearing: Partial weight bearing RLE Partial Weight Bearing Percentage or Pounds: 50%      Mobility Bed Mobility Overal bed mobility: Needs Assistance Bed Mobility: Supine to Sit     Supine to sit: Supervision     General bed mobility comments: supervision for safety.   Transfers Overall transfer level: Needs assistance Equipment used: Rolling walker (2 wheeled) Transfers: Sit to/from Stand Sit to Stand: Supervision         General transfer comment: supervision for safety. Pt had good technique and was able to come to stand quickly.     Balance Overall balance assessment: Needs assistance Sitting-balance support: No upper extremity supported Sitting balance-Leahy Scale: Good     Standing balance support: No upper extremity supported Standing balance-Leahy Scale: Fair Standing balance comment: able to do grooming activities standing at the sink without using RW or counter for support.                             ADL Overall ADL's : Needs assistance/impaired Eating/Feeding: Independent;Sitting   Grooming: Standing;Supervision/safety   Upper Body Bathing: Set up;Sitting   Lower Body Bathing: Sit to/from stand;Supervison/ safety   Upper Body Dressing : Set up;Sitting   Lower Body Dressing: Sit to/from stand;With adaptive equipment;Supervision/safety Banker)   Toilet Transfer: Min guard;BSC;Ambulation;RW   Toileting- Clothing Manipulation and Hygiene: Supervision/safety;Sit to/from stand   Tub/ Shower Transfer: Walk-in shower;Min guard;Ambulation;Shower seat;Rolling walker   Functional mobility during ADLs: Min guard;Rolling walker General ADL Comments: Educated and had pt practice shower transfer to shower seat. Educated pt on LB bathing/dressing sequence and technique, and pt stated that he would use his reacher to assist with underwear and pants.                Pertinent Vitals/Pain No c/o pain during tx.        Extremity/Trunk Assessment Upper Extremity Assessment Upper Extremity Assessment: Overall WFL for tasks assessed   Lower Extremity Assessment Lower Extremity Assessment: Defer to PT evaluation   Cervical / Trunk Assessment Cervical / Trunk Assessment: Normal   Communication Communication Communication: No difficulties   Cognition Arousal/Alertness: Awake/alert Behavior During Therapy: WFL for tasks assessed/performed Overall Cognitive Status: Within Functional Limits for tasks assessed                                Home Living Family/patient expects to be discharged to:: Private residence Living Arrangements: Spouse/significant other Available Help at Discharge: Family  Type of Home: House Home Access: Stairs to enter [employer] of Steps: 2 Entrance Stairs-Rails: Right;Left Home Layout: One level     Bathroom Shower/Tub: Walk-in shower;Door   Bathroom Toilet: Handicapped height     Home Equipment: Environmental consultant - 2  wheels;Bedside commode;Cane - single point;Grab bars - tub/shower;Grab bars - toilet;Shower seat - built in;Hand held shower head;Adaptive equipment Adaptive Equipment: Reacher;Sock aid Additional Comments: Pt had full handicap accessible bathroom installed recently      Prior Functioning/Environment Level of Independence: Independent                      OT Goals(Current goals can be found in the care plan section) Acute Rehab OT Goals Patient Stated Goal: Back independent and activie   End of Session Equipment Utilized During Treatment: Rolling walker CPM Right Knee CPM Right Knee: Off  Activity Tolerance: Patient tolerated treatment well Patient left: in chair;with call bell/phone within reach   Time:  -    Charges:    G-Codes:    Lyda Perone 13-Apr-2014, 10:55 AM

## 2014-04-01 NOTE — Progress Notes (Signed)
Physical Therapy Treatment Patient Details Name: [name] MRN: [id number] DOB: 1934-11-28 Today's Date: 04/01/2014    History of Present Illness Admitted for elective R TKA    PT Comments    Pt progressing well with mobility. Noticeable changes from AM session in transfers and strenghth. Pt planning to D/C home tomorrow. Plan on practicing stair training next session.   Follow Up Recommendations  Home health PT     Equipment Recommendations  None recommended by PT    Recommendations for Other Services       Precautions / Restrictions Precautions Precautions: Knee Precaution Comments: reviewed precautions with pt Restrictions Weight Bearing Restrictions: Yes RLE Weight Bearing: Partial weight bearing RLE Partial Weight Bearing Percentage or Pounds: 50%    Mobility  Bed Mobility Overal bed mobility: Needs Assistance Bed Mobility: Supine to Sit     Supine to sit: HOB elevated;Supervision     General bed mobility comments: supervision for safety. Used bed rails for assist.  Transfers Overall transfer level: Needs assistance Equipment used: Rolling walker (2 wheeled) Transfers: Sit to/from Stand Sit to Stand: Min guard         General transfer comment: min guard for safety and to help steady. pt able to come to standing quickly with good hand placements.  Ambulation/Gait Ambulation/Gait assistance: Min guard Ambulation Distance (Feet): 150 Feet Assistive device: Rolling walker (2 wheeled) Gait Pattern/deviations: Step-to pattern;Decreased stride length;Narrow base of support Gait velocity: decreased Gait velocity interpretation: Below normal speed for age/gender General Gait Details: nerve block has dissipated. Pt beginning to amb with step through pattern.    Stairs            Wheelchair Mobility    Modified Rankin (Stroke Patients Only)       Balance Overall balance assessment: Needs assistance Sitting-balance support: No upper  extremity supported Sitting balance-Leahy Scale: Good     Standing balance support: No upper extremity supported Standing balance-Leahy Scale: Fair Standing balance comment: able to do grooming activities standing at the sink without using RW or counter for support.                    Cognition Arousal/Alertness: Awake/alert Behavior During Therapy: WFL for tasks assessed/performed Overall Cognitive Status: Within Functional Limits for tasks assessed                      Exercises Total Joint Exercises Ankle Circles/Pumps: AROM;20 reps;Seated Quad Sets: AROM;10 reps;Both;Seated Gluteal Sets: AROM;Seated;10 reps;Right Heel Slides: AAROM;Seated;Right;5 reps Hip ABduction/ADduction: AAROM;Seated;10 reps;Right Straight Leg Raises: AROM;Right;10 reps;Supine Long Arc Quad: AAROM;Seated;Right;10 reps    General Comments        Pertinent Vitals/Pain no apparent distress. Pt repositioned in recliner for comfort with heel propped to extend R knee.      Home Living Family/patient expects to be discharged to:: Private residence Living Arrangements: Spouse/significant other Available Help at Discharge: Family Type of Home: House Home Access: Stairs to enter Entrance Stairs-Rails: [place]: One level Home Equipment: Environmental consultant - 2 wheels;Bedside commode;Cane - single point;Grab bars - tub/shower;Grab bars - toilet;Shower seat - built in;Hand held shower head;Adaptive equipment Additional Comments: Pt had full handicap accessible bathroom installed recently    Prior Function Level of Independence: Independent          PT Goals (current goals can now be found in the care plan section) Acute Rehab PT Goals Patient Stated Goal: Back independent and activie Progress towards PT goals: Progressing toward  goals    Frequency  7X/week    PT Plan      Co-evaluation             End of Session Equipment Utilized During Treatment: Gait belt Activity  Tolerance: Patient tolerated treatment well Patient left: in chair;with call bell/phone within reach     Time: 0175-1025 PT Time Calculation (min): 19 min  Charges:  $Gait Training: 8-22 mins $Therapeutic Exercise: 8-22 mins                    G Codes:      BRASFIELD,[name],SPTA 04/01/2014, 11:50 AM

## 2014-04-01 NOTE — Op Note (Signed)
[name], [name] NO.:  [id number]  MEDICAL RECORD NO.:  [id number]  LOCATION:  5N31C                        FACILITY:  [facility]  PHYSICIAN:  Vonna Kotyk. Hila Bolding, M.D.DATE OF BIRTH:  [birth year]  DATE OF PROCEDURE:  03/31/2014 DATE OF DISCHARGE:                              OPERATIVE REPORT   PREOPERATIVE DIAGNOSIS:  End-stage osteoarthritis, right knee.  POSTOPERATIVE DIAGNOSIS:  End-stage osteoarthritis, right knee.  PROCEDURE:  Right total knee replacement.  SURGEON:  Vonna Kotyk. Durward Fortes, M.D.  ASSISTANT:  Aaron Edelman D. Petrarca, PA-C, who was present throughout the operative procedure to ensure its timely completion.  ANESTHESIA:  General with supplemental femoral nerve block.  COMPLICATIONS:  None.  COMPONENTS:  DePuy LCS large femoral component, #5 keeled tibial tray, with a 10-mm polyethylene bridging bearing, a metal-backed 3-pegged patella.  Components were secured with polymethyl methacrylate.  DESCRIPTION OF PROCEDURE:  [name] was met in the holding area, identified the right knee as appropriate operative site and marked it accordingly.  He did receive a preoperative femoral nerve block per Anesthesia.  The patient was transported to room #7 and placed under general orotracheal anesthesia without difficulty.  Nursing staff inserted a Foley catheter.  Urine was clear.  The right lower extremity was then placed in a thigh tourniquet.  The leg was then prepped with chlorhexidine scrub and DuraPrep from the tourniquet to the tips of the toes.  Sterile draping was performed.  With the extremity still elevated, it was Esmarch exsanguinated with the proximal tourniquet at 350 mmHg.  Midline longitudinal incision was made, centered about the patella, extending from the superior pouch and tibial tubercle.  Via sharp dissection, incision was carried down to subcutaneous tissue.  There was a small prepatellar bursa that was excised.  First layer of  capsule was incised in the midline.  A medial parapatellar incision was then made with the Bovie.  The joint was entered.  There was a dark yellow effusion, approximately 30 mL in volume.  Patella was everted to 180 degrees laterally, the knee flexed to 90 degrees.  There was complete absence of articular cartilage in the patella and the medial femoral condyle and to a greater extent lateral femoral condyle, representing end-stage osteoarthritis.  There were also large areas of articular cartilage loss in both the lateral and medial tibial plateau.  The patient had diffuse synovitis which was carefully resected as the patient had recurrent effusions preoperatively.  Larger osteophytes were removed from the medial and lateral femoral condyle and medial tibial plateau.  I then measured a large femoral component.  Retractors were placed around the tibia.  The first bony cut was made transversely on the proximal tibia with a 7-degree angle of declination. With each bony cut, I checked the alignment with the external guide. Subsequent cuts were then made on the femur using the large femoral jig. MCL and LCL remained intact throughout the procedure.  Flexion and extension gaps were symmetrical at 10 mm.  I used a 4-degree distal femoral valgus cut, and then the tapering cuts with the final tibial and femoral jig.  Retractor was then placed around the tibia, was advanced anteriorly, and measured to a #5  tibial tray.  This was pinned in place.  Center hole was made followed by the keeled cut.  With the tibial jig in place, the 10-mm bridging bearing was then inserted and followed by the large femoral component, the joint was then reduced, with an opening with varus or valgus stress, full extension, and no malrotation of tibial components.  I did use a lamina spreader between the joint to remove medial and lateral menisci as well as ACL and PCL and osteophytes in the femoral condyles using  a three-quarter inch osteotome prior to making the femoral cuts.  The patella was then prepared by removing approximately 8 mm of bone, leaving 12 mm of patella thickness.  Three holes were then made with the patella jig.  The trial patella inserted through a full range of motion, remained perfectly stable.  The trial components were then removed.  The joint was copiously irrigated with saline solution and removed any remaining obvious synovitis.  Final components were then impacted with polymethyl methacrylate, initially inserting the tibia, the polyethylene component, and then the femoral component.  The components were impacted in place, knee placed in extension, and then I removed any extraneous methacrylate from around the periphery of all the components.  Patella was impacted with methacrylate and a patellar clamp.  At approximately 15 minutes, the methacrylate was hardened and matured.  The joint was explored, there was no further extraneous methacrylate.  Joint was irrigated with 0.25% Marcaine with epinephrine.  Tourniquet was deflated at approximately 92 minutes with immediate capillary refill to the joint surface.  The wound was again irrigated with saline solution.  We used bone wax along bleeding bone edges.  We had a nice dry field before closure.  A medium-size Hemovac was placed at the lateral compartment.  The deep capsule was then closed with a combination of #1 Ethibond and #1 Vicryl.  Wound was again irrigated with saline solution.  The deep capsule closed with running 0 Vicryl, subcu in several layers with 2-0 Vicryl and 3-0 Monocryl.  Skin closed with skin clips.  Sterile bulky dressing was applied followed by sterile dressing and the patient's support stocking.  The patient tolerated the procedure without complications.     Vonna Kotyk. Durward Fortes, M.D.     PWW/MEDQ  D:  03/31/2014  T:  03/31/2014  Job:  [id number]

## 2014-04-01 NOTE — Progress Notes (Signed)
Patient ID: [name], male   DOB: 03-06-1935, 79 y.o.   MRN: [id number] PATIENT ID: [name]        MRN:  [id number]          DOB/AGE: 1935/05/01 / 79 y.o.    George Fears, MD   Biagio Borg, PA-C [address] [place], [place]  [address]                             [phone]   PROGRESS NOTE  Subjective:  negative for Chest Pain  negative for Shortness of Breath  negative for Nausea/Vomiting   negative for Calf Pain    Tolerating Diet: yes         Patient reports pain as mild.     Good night  Objective: Vital signs in last 24 hours:   Patient Vitals for the past 24 hrs:  BP Temp Temp src Pulse Resp SpO2  04/01/14 0559 108/49 mmHg 97.5 F (36.4 C) - 58 16 95 %  04/01/14 0400 - - - - 16 96 %  04/01/14 0107 111/56 mmHg 97.2 F (36.2 C) - 51 16 96 %  04/01/14 0000 - - - - 16 96 %  03/31/14 2105 110/60 mmHg 97.8 F (36.6 C) Oral 59 16 95 %  03/31/14 2000 - - - - 16 95 %  03/31/14 1814 - - - 56 - 92 %  03/31/14 1200 117/60 mmHg 97.4 F (36.3 C) Oral 52 16 90 %  03/31/14 1125 - 97.3 F (36.3 C) - 55 15 98 %  03/31/14 1120 - - - 50 16 94 %  03/31/14 1115 112/58 mmHg - - 55 17 97 %  03/31/14 1101 130/69 mmHg - - 50 12 97 %  03/31/14 1100 - - - 52 10 99 %  03/31/14 1046 136/74 mmHg - - 55 9 98 %  03/31/14 1045 - - - 58 16 96 %  03/31/14 1031 134/71 mmHg - - 53 19 95 %  03/31/14 1030 - - - 57 7 93 %  03/31/14 1016 149/96 mmHg - - 63 18 96 %  03/31/14 1015 - - - 74 16 92 %  03/31/14 1014 - 97.3 F (36.3 C) - - - -      Intake/Output from previous day:   07/28 0701 - 07/29 0700 In: 2170 [P.O.:720; I.V.:1450] Out: 2565 [Urine:2260; Drains:155]   Intake/Output this shift:       Intake/Output     07/28 0701 - 07/29 0700 07/29 0701 - 07/30 0700   P.O. 720    I.V. (mL/kg) 1450 (13.9)    Total Intake(mL/kg) 2170 (20.8)    Urine (mL/kg/hr) 2260 (0.9)    Drains 155 (0.1)    Blood 150 (0.1)    Total Output 2565     Net -395              LABORATORY DATA:  Recent Labs  04/01/14 0530  WBC 11.7*  HGB 12.5*  HCT 36.7*  PLT PENDING    Recent Labs  04/01/14 0530  NA 141  K 4.4  CL 102  CO2 27  BUN 13  CREATININE 0.80  GLUCOSE 125*  CALCIUM 8.3*   Lab Results  Component Value Date   INR 1.08 03/20/2014   INR 1.00 07/12/2011   INR 1.33 02/10/2011    Recent Radiographic Studies :  Chest 2 View  03/20/2014   CLINICAL DATA:  Preoperative respiratory evaluation for knee replacement  EXAM: CHEST  2 VIEW  COMPARISON:  11/26/2009  FINDINGS: No focal airspace consolidation. No pulmonary edema or pleural effusion. Cardiopericardial silhouette is at upper limits of normal for size. Interstitial markings are diffusely coarsened with chronic features. Imaged bony structures of the thorax are intact.  IMPRESSION: Stable.  No acute cardiopulmonary process.   Electronically Signed   By: Misty Stanley M.D.   On: 03/20/2014 09:37     Examination:  General appearance: alert, cooperative and no distress  Wound Exam: draining   Drainage:  Scant/small amount Serosanguinous exudate  Motor Exam: EHL, FHL, Anterior Tibial and Posterior Tibial Intact  Sensory Exam: Superficial Peroneal, Deep Peroneal and Tibial normal  Vascular Exam: Normal  Assessment:    1 Day Post-Op  Procedure(s) (LRB): TOTAL KNEE ARTHROPLASTY (Right)  ADDITIONAL DIAGNOSIS:  Active Problems:   S/P total knee replacement using cement  Right heel pain from pressure-observe  Plan: Physical Therapy as ordered Partial Weight Bearing @ 50% (PWB)  DVT Prophylaxis:  Xarelto, Foot Pumps and TED hose  DISCHARGE PLAN: Home  DISCHARGE NEEDS: HHPT, CPM, Walker and 3-in-1 comode seat     Foley out, hemovac drainage 150cc's- will leave in until tomorrow, OOB with PT, dressing changed    Aaron Edelman D. [place], [place] [phone]  04/01/2014 7:51 AM

## 2014-04-01 NOTE — Progress Notes (Signed)
Seen and agreed 04/01/2014 Jacqualyn Posey PTA [phone] pager [phone] office

## 2014-04-01 NOTE — Progress Notes (Signed)
Physical Therapy Treatment Patient Details Name: [name] MRN: [id number] DOB: 09/23/1934 Today's Date: 04/01/2014    History of Present Illness Admitted for elective R TKA    PT Comments    Pt is very motivated to get up and moving and is doing well with mobility. Plan to practice stair training during tomorrows session.  Pt planning to D/C home tomorrow.   Follow Up Recommendations  Home health PT     Equipment Recommendations  None recommended by PT    Recommendations for Other Services       Precautions / Restrictions Precautions Precautions: Knee Precaution Comments: reviewed precautions with pt Restrictions RLE Weight Bearing: Weight bearing as tolerated RLE Partial Weight Bearing Percentage or Pounds: 50%    Mobility  Bed Mobility Overal bed mobility: Needs Assistance Bed Mobility: Supine to Sit     Supine to sit: Min guard;Supervision     General bed mobility comments: supervision for safety.   Transfers Overall transfer level: Needs assistance Equipment used: Rolling walker (2 wheeled) Transfers: Sit to/from Stand Sit to Stand: Supervision         General transfer comment: supervision for safety. Pt had good technique and was able to come to stand quickly.   Ambulation/Gait Ambulation/Gait assistance: Min guard Ambulation Distance (Feet): 100 Feet Assistive device: Rolling walker (2 wheeled) Gait Pattern/deviations: Step-to pattern;Decreased stride length Gait velocity: decreased Gait velocity interpretation: Below normal speed for age/gender General Gait Details: nerve block still causing knee to buckle periodically. Pt has good control of RW and does well with gait sequencing with RW.    Stairs            Wheelchair Mobility    Modified Rankin (Stroke Patients Only)       Balance Overall balance assessment: Needs assistance Sitting-balance support: No upper extremity supported Sitting balance-Leahy Scale: Good      Standing balance support: Single extremity supported;During functional activity Standing balance-Leahy Scale: Poor Standing balance comment: Relies on RW for support                    Cognition Arousal/Alertness: Awake/alert Behavior During Therapy: WFL for tasks assessed/performed Overall Cognitive Status: Within Functional Limits for tasks assessed                      Exercises Total Joint Exercises Ankle Circles/Pumps: AROM;20 reps;Supine;Seated Quad Sets: AROM;10 reps;Both;Seated Gluteal Sets: AROM;Seated;10 reps;Right Heel Slides: AAROM;Seated;Right;5 reps Hip ABduction/ADduction: AAROM;Seated;10 reps;Right Long Arc Quad: AAROM;Seated;Right;10 reps    General Comments        Pertinent Vitals/Pain 3/10. Pt repositioned in recliner for comfort with OT in room at end of session.     Home Living Family/patient expects to be discharged to:: Private residence Living Arrangements: Spouse/significant other Available Help at Discharge: Family Type of Home: House Home Access: Stairs to enter Entrance Stairs-Rails: [place]: One level Home Equipment: Environmental consultant - 2 wheels;Bedside commode;Cane - single point;Grab bars - tub/shower;Grab bars - toilet;Shower seat - built in;Hand held shower head Additional Comments: Pt had full handicap accessible bathroom installed    Prior Function Level of Independence: Independent          PT Goals (current goals can now be found in the care plan section) Progress towards PT goals: Progressing toward goals    Frequency  7X/week    PT Plan      Co-evaluation             End  of Session Equipment Utilized During Treatment: Gait belt Activity Tolerance: Patient tolerated treatment well Patient left: in chair;with call bell/phone within reach     Time: 0827-0903 PT Time Calculation (min): 36 min  Charges:                       G Codes:      BRASFIELD,Kenly Xiao,SPTA 04/01/2014, 10:02 AM

## 2014-04-01 NOTE — Evaluation (Signed)
Agree with note. [phone] EV, 3SC 04/01/2014 Nestor Lewandowsky, OTR/L Pager: [phone]

## 2014-04-01 NOTE — Progress Notes (Signed)
Patient c/o of being unable to void. Bladder scan of 650 cc. I&O cath for 700 cc urine. Cath removed. Forcing fluids. Will anticipate voiding or 2nd bladder scan before 2300. Dr Marlou Sa spoken to and aware of patients difficulty, D/C orders written, for 7/29, Dr Marlou Sa spoken to regarding poor pain control, CPM angle reduction, and being unable to void. Discharge delayed until 7/30.

## 2014-04-02 ENCOUNTER — Inpatient Hospital Stay (HOSPITAL_COMMUNITY): Payer: Medicare Other

## 2014-04-02 LAB — BASIC METABOLIC PANEL
ANION GAP: 12 (ref 5–15)
BUN: 12 mg/dL (ref 6–23)
CO2: 28 meq/L (ref 19–32)
Calcium: 8.4 mg/dL (ref 8.4–10.5)
Chloride: 98 mEq/L (ref 96–112)
Creatinine, Ser: 0.77 mg/dL (ref 0.50–1.35)
GFR calc Af Amer: >90 mL/min (ref >90)
GFR, EST NON AFRICAN AMERICAN: 86 mL/min — AB (ref >90)
Glucose, Bld: 136 mg/dL — ABNORMAL HIGH (ref 70–99)
Potassium: 3.9 mEq/L (ref 3.7–5.3)
SODIUM: 138 meq/L (ref 137–147)

## 2014-04-02 LAB — CBC
HCT: 36.1 % — ABNORMAL LOW (ref 39.0–52.0)
HEMOGLOBIN: 12.4 g/dL — AB (ref 13.0–17.0)
MCH: 31.7 pg (ref 26.0–34.0)
MCHC: 34.3 g/dL (ref 30.0–36.0)
MCV: 92.3 fL (ref 78.0–100.0)
PLATELETS: 108 10*3/uL — AB (ref 150–400)
RBC: 3.91 MIL/uL — ABNORMAL LOW (ref 4.22–5.81)
RDW: 13.6 % (ref 11.5–15.5)
WBC: 9.8 10*3/uL (ref 4.0–10.5)

## 2014-04-02 MED ORDER — HYDROMORPHONE HCL 2 MG PO TABS
2.0000 mg | ORAL_TABLET | ORAL | Status: DC | PRN
  Administered 2014-04-02 – 2014-04-04 (×8): 2 mg via ORAL
  Filled 2014-04-02 (×8): qty 1

## 2014-04-02 MED ORDER — BISACODYL 10 MG RE SUPP
10.0000 mg | Freq: Once | RECTAL | Status: AC
  Administered 2014-04-02: 10 mg via RECTAL
  Filled 2014-04-02: qty 1

## 2014-04-02 MED ORDER — METOCLOPRAMIDE HCL 5 MG/ML IJ SOLN
5.0000 mg | Freq: Three times a day (TID) | INTRAMUSCULAR | Status: AC
  Administered 2014-04-02 – 2014-04-03 (×3): 5 mg via INTRAVENOUS
  Filled 2014-04-02 (×3): qty 2

## 2014-04-02 MED ORDER — SODIUM CHLORIDE 0.9 % IV SOLN
INTRAVENOUS | Status: DC
  Administered 2014-04-02: 17:00:00 via INTRAVENOUS

## 2014-04-02 NOTE — Progress Notes (Signed)
Patient ID: RAMIN ZOLL, male   DOB: Oct 23, 1934, 79 y.o.   MRN: [id number] PATIENT ID: GARNETT NUNZIATA        MRN:  [id number]          DOB/AGE: July 03, 1935 / 79 y.o.    [name], MD   Biagio Borg, PA-C [address] [place], [place]  [address]                             [phone]   PROGRESS NOTE  Subjective:  negative for Chest Pain  negative for Shortness of Breath  positive for Nausea without Vomiting   negative for Calf Pain    Tolerating Diet: no         Patient reports pain as moderate.     Had problems with urinary retention last night.  After straight cath of 732ml, was able to void.  Problems with pain control today as well as nausea   Objective: Vital signs in last 24 hours:   Patient Vitals for the past 24 hrs:  BP Temp Temp src Pulse Resp SpO2  04/02/14 1421 134/56 mmHg 97.8 F (36.6 C) Oral 98 - 88 %  04/02/14 0535 123/53 mmHg 98.4 F (36.9 C) Oral 84 16 95 %  04/02/14 0000 - - - - 16 94 %  04/01/14 2139 114/51 mmHg 99.5 F (37.5 C) Oral 87 16 94 %  04/01/14 2000 - - - - 16 94 %      Intake/Output from previous day:   07/29 0701 - 07/30 0700 In: 630 [P.O.:630] Out: 290 [Drains:290]   Intake/Output this shift:   07/30 0701 - 07/30 1900 In: -  Out: 200 [Urine:200]   Intake/Output     07/29 0701 - 07/30 0700 07/30 0701 - 07/31 0700   P.O. 630    I.V. (mL/kg)     Total Intake(mL/kg) 630 (6)    Urine (mL/kg/hr)  200 (0.2)   Drains 290 (0.1)    Blood     Total Output 290 200   Net +340 -200           LABORATORY DATA:  Recent Labs  04/01/14 0530 04/02/14 0618  WBC 11.7* 9.8  HGB 12.5* 12.4*  HCT 36.7* 36.1*  PLT 115* 108*    Recent Labs  04/01/14 0530 04/02/14 0618  NA 141 138  K 4.4 3.9  CL 102 98  CO2 27 28  BUN 13 12  CREATININE 0.80 0.77  GLUCOSE 125* 136*  CALCIUM 8.3* 8.4   Lab Results  Component Value Date   INR 1.08 03/20/2014   INR 1.00 07/12/2011   INR 1.33 02/10/2011    Recent Radiographic  Studies :  Chest 2 View  03/20/2014   CLINICAL DATA:  Preoperative respiratory evaluation for knee replacement  EXAM: CHEST  2 VIEW  COMPARISON:  11/26/2009  FINDINGS: No focal airspace consolidation. No pulmonary edema or pleural effusion. Cardiopericardial silhouette is at upper limits of normal for size. Interstitial markings are diffusely coarsened with chronic features. Imaged bony structures of the thorax are intact.  IMPRESSION: Stable.  No acute cardiopulmonary process.   Electronically Signed   By: Misty Stanley M.D.   On: 03/20/2014 09:37     Examination:  General appearance: alert, cooperative and moderate distress Resp: clear to auscultation bilaterally Cardio: regular rate and rhythm GI: abnormal findings:  absent bowel sounds Extremities: edema mild  Wound Exam: clean, dry,  intact   Drainage:  None: wound tissue dry  Motor Exam: EHL, FHL, Anterior Tibial and Posterior Tibial Intact  Sensory Exam: Superficial Peroneal, Deep Peroneal and Tibial normal  Vascular Exam: Right dorsalis pedis artery has 1+ (weak) pulse  Assessment:    2 Days Post-Op  Procedure(s) (LRB): TOTAL KNEE ARTHROPLASTY (Right)  ADDITIONAL DIAGNOSIS:  Principal Problem:   Osteoarthritis of right knee Active Problems:   S/P total knee replacement using cement  Ileus    Plan: Physical Therapy as ordered Partial Weight Bearing @ 50% (PWB)  DVT Prophylaxis:  Xarelto, Foot Pumps and TED hose  DISCHARGE PLAN: Home tomorrow or Saturday  DISCHARGE NEEDS: HHPT, CPM, Walker and 3-in-1 comode seat  Have discussed some of the care issues with nursing.  They will investigate delays of pain meds and bladder scan. Since osat is 88% (may be secondary to abdominal distention or atelectasis) will get CXR Since lack of bowel sounds will begin Regan q8h x3 doses.  Will obtain KUB also. Hold discharge Stopped Oxy IR and begin Dilaudid 2-4 mg q4h prn pain         Mike Craze. [place], [place] [phone]  04/02/2014 3:28 PM

## 2014-04-02 NOTE — Progress Notes (Signed)
Physical Therapy Treatment Patient Details Name: [name] MRN: [id number] DOB: 05-04-1935 Today's Date: 04/02/2014    History of Present Illness Admitted for elective R TKA    PT Comments    Session cut short due to weakness, nausea and pain. RN made aware. Pt able to amb to bathroom and back to chair and became very nauseous.   Follow Up Recommendations  Home health PT     Equipment Recommendations  None recommended by PT    Recommendations for Other Services       Precautions / Restrictions Precautions Precautions: Knee Restrictions Weight Bearing Restrictions: Yes RLE Weight Bearing: Partial weight bearing RLE Partial Weight Bearing Percentage or Pounds: 50%    Mobility  Bed Mobility Overal bed mobility: Needs Assistance Bed Mobility: Supine to Sit           General bed mobility comments: supervision for safety. Used bed rails for assist.  Transfers Overall transfer level: Needs assistance Equipment used: Rolling walker (2 wheeled) Transfers: Sit to/from Stand Sit to Stand: Min assist         General transfer comment: min assist to power to standing due to weakness and nausea.   Ambulation/Gait Ambulation/Gait assistance: Min guard Ambulation Distance (Feet): 10 Feet Assistive device: Rolling walker (2 wheeled) Gait Pattern/deviations: Step-to pattern     General Gait Details: Pt only able amb to bathroom and back to chair.    Stairs            Wheelchair Mobility    Modified Rankin (Stroke Patients Only)       Balance                                    Cognition Arousal/Alertness: Awake/alert Behavior During Therapy: WFL for tasks assessed/performed Overall Cognitive Status: Within Functional Limits for tasks assessed                      Exercises      General Comments        Pertinent Vitals/Pain Did not rate pain,but said pain was "very bad" this morning.     Home Living                       Prior Function            PT Goals (current goals can now be found in the care plan section) Progress towards PT goals: Progressing toward goals    Frequency  7X/week    PT Plan      Co-evaluation             End of Session Equipment Utilized During Treatment: Gait belt Activity Tolerance: Patient limited by fatigue;Patient limited by pain Patient left: in chair;with call bell/phone within reach     Time: 8921-1941 PT Time Calculation (min): 30 min  Charges:                       G Codes:      BRASFIELD,Kirsten Spearing,SPTA 04/02/2014, 8:23 AM

## 2014-04-02 NOTE — Progress Notes (Signed)
Seen and agreed 04/02/2014 Jacqualyn Posey PTA [phone] pager [phone] office

## 2014-04-02 NOTE — Progress Notes (Signed)
Seen and agreed 04/02/2014 Jacqualyn Posey PTA [phone] pager [phone] office

## 2014-04-02 NOTE — Progress Notes (Signed)
Physical Therapy Treatment Patient Details Name: RAVIS HERNE MRN: [id number] DOB: 08/19/1935 Today's Date: 04/02/2014    History of Present Illness Admitted for elective R TKA    PT Comments    Pt very weak and shaking and very dependent for transfers. Had to stop session due to nausea. Pt stated "I cant do anymore" Plan to work on gait and there ex next session.   Follow Up Recommendations  Home health PT     Equipment Recommendations  None recommended by PT    Recommendations for Other Services       Precautions / Restrictions Precautions Precautions: Knee Precaution Comments: reviewed precautions with pt Restrictions RLE Weight Bearing: Partial weight bearing RLE Partial Weight Bearing Percentage or Pounds: 50%    Mobility  Bed Mobility Overal bed mobility: Needs Assistance Bed Mobility: Supine to Sit     Supine to sit: HOB elevated;Min assist     General bed mobility comments: min assist to come to sitting and to move leg to EOB.   Transfers Overall transfer level: Needs assistance Equipment used: Rolling walker (2 wheeled) Transfers: Sit to/from Stand Sit to Stand: Min assist         General transfer comment: min assist to power to standing due to weakness and nausea.   Ambulation/Gait             General Gait Details: Pt only able amb to bathroom and back to chair.    Stairs            Wheelchair Mobility    Modified Rankin (Stroke Patients Only)       Balance                                    Cognition Arousal/Alertness: Awake/alert Behavior During Therapy: WFL for tasks assessed/performed Overall Cognitive Status: Within Functional Limits for tasks assessed                      Exercises      General Comments        Pertinent Vitals/Pain Pt did not rate pain but stated his knee was in "alot of pain"     Home Living                      Prior Function            PT Goals  (current goals can now be found in the care plan section) Progress towards PT goals: Progressing toward goals    Frequency  7X/week    PT Plan      Co-evaluation             End of Session Equipment Utilized During Treatment: Gait belt Activity Tolerance: Patient limited by fatigue;Patient limited by pain Patient left: in chair;with call bell/phone within reach     Time: 1356-1426 PT Time Calculation (min): 30 min  Charges:                       G Codes:      BRASFIELD,Chaos Carlile,SPTA 04/02/2014, 2:33 PM

## 2014-04-03 ENCOUNTER — Inpatient Hospital Stay (HOSPITAL_COMMUNITY): Payer: Medicare Other

## 2014-04-03 DIAGNOSIS — N401 Enlarged prostate with lower urinary tract symptoms: Secondary | ICD-10-CM | POA: Diagnosis not present

## 2014-04-03 DIAGNOSIS — R338 Other retention of urine: Secondary | ICD-10-CM

## 2014-04-03 DIAGNOSIS — K567 Ileus, unspecified: Secondary | ICD-10-CM | POA: Diagnosis not present

## 2014-04-03 DIAGNOSIS — K9189 Other postprocedural complications and disorders of digestive system: Secondary | ICD-10-CM

## 2014-04-03 LAB — CBC
HCT: 30 % — ABNORMAL LOW (ref 39.0–52.0)
HEMOGLOBIN: 10.4 g/dL — AB (ref 13.0–17.0)
MCH: 31.3 pg (ref 26.0–34.0)
MCHC: 34.7 g/dL (ref 30.0–36.0)
MCV: 90.4 fL (ref 78.0–100.0)
Platelets: 95 10*3/uL — ABNORMAL LOW (ref 150–400)
RBC: 3.32 MIL/uL — ABNORMAL LOW (ref 4.22–5.81)
RDW: 13.5 % (ref 11.5–15.5)
WBC: 7 10*3/uL (ref 4.0–10.5)

## 2014-04-03 LAB — BASIC METABOLIC PANEL
Anion gap: 12 (ref 5–15)
BUN: 12 mg/dL (ref 6–23)
CO2: 27 mEq/L (ref 19–32)
Calcium: 8 mg/dL — ABNORMAL LOW (ref 8.4–10.5)
Chloride: 98 mEq/L (ref 96–112)
Creatinine, Ser: 0.72 mg/dL (ref 0.50–1.35)
GFR calc non Af Amer: 88 mL/min — ABNORMAL LOW (ref >90)
GLUCOSE: 131 mg/dL — AB (ref 70–99)
POTASSIUM: 4.2 meq/L (ref 3.7–5.3)
Sodium: 137 mEq/L (ref 137–147)

## 2014-04-03 MED ORDER — HYDROMORPHONE HCL 2 MG PO TABS: 2.0000 mg | ORAL_TABLET | ORAL | Status: DC | PRN

## 2014-04-03 MED ORDER — METHOCARBAMOL 500 MG PO TABS: 500.0000 mg | ORAL_TABLET | Freq: Three times a day (TID) | ORAL | Status: DC | PRN

## 2014-04-03 MED ORDER — BISACODYL 10 MG RE SUPP
10.0000 mg | Freq: Once | RECTAL | Status: AC
  Administered 2014-04-03: 10 mg via RECTAL
  Filled 2014-04-03: qty 1

## 2014-04-03 MED ORDER — TAMSULOSIN HCL 0.4 MG PO CAPS: 0.4000 mg | ORAL_CAPSULE | Freq: Every day | ORAL | Status: DC

## 2014-04-03 MED ORDER — RIVAROXABAN 10 MG PO TABS: 10.0000 mg | ORAL_TABLET | ORAL | Status: DC

## 2014-04-03 MED ORDER — TAMSULOSIN HCL 0.4 MG PO CAPS
0.4000 mg | ORAL_CAPSULE | Freq: Every day | ORAL | Status: DC
  Administered 2014-04-03 – 2014-04-04 (×2): 0.4 mg via ORAL
  Filled 2014-04-03 (×3): qty 1

## 2014-04-03 NOTE — Progress Notes (Signed)
Patient ID: [name], male   DOB: Jun 24, 1935, 79 y.o.   MRN: [id number] PATIENT ID: MAGGIE DWORKIN        MRN:  [id number]          DOB/AGE: 1935-04-22 / 79 y.o.    [name], MD   Biagio Borg, PA-C [address] [place], [place]  [address]                             [phone]   PROGRESS NOTE  Subjective:  negative for Chest Pain  negative for Shortness of Breath  negative for Nausea/Vomiting ...small BM last night  negative for Calf Pain    Tolerating Diet: yes SIPS AND CHIPS       Patient reports pain as mild and moderate.  Much better this morning on dilaudid   Better this morning but having problems with voiding only small amounts  Objective: Vital signs in last 24 hours:   Patient Vitals for the past 24 hrs:  BP Temp Temp src Pulse Resp SpO2  04/03/14 0524 123/65 mmHg 99.5 F (37.5 C) Oral 93 18 95 %  04/03/14 0400 - - - - 18 93 %  04/03/14 0000 - - - - 17 -  04/02/14 2117 130/62 mmHg 98.2 F (36.8 C) Oral 80 16 97 %  04/02/14 1937 - - - - 17 -  04/02/14 1730 - - - - - 93 %  04/02/14 1421 134/56 mmHg 97.8 F (36.6 C) Oral 98 - 88 %      Intake/Output from previous day:   07/30 0701 - 07/31 0700 In: -  Out: 1825 [Urine:1825]   Intake/Output this shift:       Intake/Output     07/30 0701 - 07/31 0700 07/31 0701 - 08/01 0700   P.O.     Total Intake(mL/kg)     Urine (mL/kg/hr) 1825 (0.7)    Drains     Total Output 1825     Net -1825          Stool Occurrence 1 x       LABORATORY DATA:  Recent Labs  04/01/14 0530 04/02/14 0618 04/03/14 0520  WBC 11.7* 9.8 7.0  HGB 12.5* 12.4* 10.4*  HCT 36.7* 36.1* 30.0*  PLT 115* 108* 95*    Recent Labs  04/01/14 0530 04/02/14 0618 04/03/14 0520  NA 141 138 137  K 4.4 3.9 4.2  CL 102 98 98  CO2 27 28 27   BUN 13 12 12   CREATININE 0.80 0.77 0.72  GLUCOSE 125* 136* 131*  CALCIUM 8.3* 8.4 8.0*   Lab Results  Component Value Date   INR 1.08 03/20/2014   INR 1.00 07/12/2011   INR  1.33 02/10/2011    Recent Radiographic Studies :  Dg Chest 2 View  04/02/2014   CLINICAL DATA:  Abdominal its distension  EXAM: CHEST  2 VIEW  COMPARISON:  PA and lateral chest x-ray of March 20, 2014  FINDINGS: The lungs are adequately inflated. There is no focal infiltrate. The cardiac silhouette is top-normal in size. The pulmonary vascularity is not engorged. There is no pleural effusion. The bony thorax exhibits no acute abnormality. No free subdiaphragmatic gas collections are demonstrated.  IMPRESSION: No acute cardiopulmonary abnormality.   Electronically Signed   By: David  Martinique   On: 04/02/2014 16:31   Chest 2 View  03/20/2014   CLINICAL DATA:  Preoperative respiratory evaluation for  knee replacement  EXAM: CHEST  2 VIEW  COMPARISON:  11/26/2009  FINDINGS: No focal airspace consolidation. No pulmonary edema or pleural effusion. Cardiopericardial silhouette is at upper limits of normal for size. Interstitial markings are diffusely coarsened with chronic features. Imaged bony structures of the thorax are intact.  IMPRESSION: Stable.  No acute cardiopulmonary process.   Electronically Signed   By: Misty Stanley M.D.   On: 03/20/2014 09:37   Dg Abd 1 View  04/02/2014   CLINICAL DATA:  Abdominal distention status post knee replacement 2 days ago  EXAM: ABDOMEN - 1 VIEW  COMPARISON:  None.  FINDINGS: There is gaseous distention of the colon. There is a moderate amount of stool in the ascending colon. There is no small bowel dilatation to suggest obstruction. There is no evidence of pneumoperitoneum, portal venous gas or pneumatosis. There are no pathologic calcifications along the expected course of the ureters.  There is a right total hip arthroplasty. 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  IMPRESSION: Gaseous distention of the colon as can be seen with an ileus given of the patient is status post surgery.   Electronically Signed   By: Kathreen Devoid   On: 04/02/2014 16:31      Examination:  General appearance: alert, cooperative, mild distress and moderate distress Resp: clear to auscultation bilaterally Cardio: regular rate and rhythm GI: abnormal findings:  hypoactive bowel sounds but better than yesterday  Wound Exam: clean, dry, intact   Drainage:  None: wound tissue dry  Motor Exam: EHL, FHL, Anterior Tibial and Posterior Tibial Intact  Sensory Exam: Superficial Peroneal, Deep Peroneal and Tibial normal  Vascular Exam: Right dorsalis pedis artery has 1+ (weak) pulse  Assessment:    3 Days Post-Op  Procedure(s) (LRB): TOTAL KNEE ARTHROPLASTY (Right)  ADDITIONAL DIAGNOSIS:  Principal Problem:   Osteoarthritis of right knee Active Problems:   S/P total knee replacement using cement  Acute Blood Loss Anemia and Urinary Retention   Plan: Physical Therapy as ordered Partial Weight Bearing @ 50% (PWB)  DVT Prophylaxis:  Xarelto, Foot Pumps and TED hose  DISCHARGE PLAN: Home when voiding and tolerating po's well  DISCHARGE NEEDS: HHPT, CPM, Walker and 3-in-1 comode seat  Will start Flomax. Consult Urology Increase Diet       Mike Craze. [place], [place] [phone]  04/03/2014 8:13 AM

## 2014-04-03 NOTE — Progress Notes (Signed)
Seen and agreed 04/03/2014 Jacqualyn Posey PTA [phone] pager [phone] office

## 2014-04-03 NOTE — Progress Notes (Signed)
Physical Therapy Treatment Patient Details Name: [name] MRN: [id number] DOB: November 09, 1934 Today's Date: 04/03/2014    History of Present Illness Admitted for elective R TKA    PT Comments    Pt is progressing with each session. Strength is improving in RLE, but still displays weakness. No signs of dizziness or nausea or SOB. Pt is motivated but still limited by pain and stiffness. Pt planning on D/C home tomorrow.   Follow Up Recommendations  Home health PT     Equipment Recommendations  None recommended by PT    Recommendations for Other Services       Precautions / Restrictions Precautions Precautions: Knee Restrictions RLE Weight Bearing: Partial weight bearing RLE Partial Weight Bearing Percentage or Pounds: 50%    Mobility  Bed Mobility Overal bed mobility: Needs Assistance Bed Mobility: Supine to Sit     Supine to sit: HOB elevated;Min assist     General bed mobility comments: min assist to come to sitting and to move leg to EOB. Uses hand rails and trapeze bar to pull self to sitting.   Transfers Overall transfer level: Needs assistance Equipment used: Rolling walker (2 wheeled) Transfers: Sit to/from Stand Sit to Stand: Min guard         General transfer comment: min guard for safety. good hand placement and pt able to power up to standing independently with walker.   Ambulation/Gait Ambulation/Gait assistance: Min guard Ambulation Distance (Feet): 100 Feet Assistive device: Rolling walker (2 wheeled) Gait Pattern/deviations: Step-to pattern;Decreased stride length;Narrow base of support Gait velocity: decreased Gait velocity interpretation: Below normal speed for age/gender General Gait Details: Good control of walker and sequencing.    Stairs            Wheelchair Mobility    Modified Rankin (Stroke Patients Only)       Balance                                    Cognition Arousal/Alertness:  Awake/alert Behavior During Therapy: WFL for tasks assessed/performed Overall Cognitive Status: Within Functional Limits for tasks assessed                      Exercises Total Joint Exercises Ankle Circles/Pumps: AROM;Seated;20 reps Quad Sets: AROM;Seated;10 reps Hip ABduction/ADduction: AAROM;Seated;10 reps Straight Leg Raises: AAROM;Seated;10 reps Long Arc Quad: AAROM;Seated;5 reps Knee Flexion: PROM;Seated;10 reps    General Comments        Pertinent Vitals/Pain no apparent distress. Pt repositioned in bed for comfort per pt request.      Home Living                      Prior Function            PT Goals (current goals can now be found in the care plan section) Progress towards PT goals: Progressing toward goals    Frequency  7X/week    PT Plan      Co-evaluation             End of Session Equipment Utilized During Treatment: Gait belt Activity Tolerance: Patient tolerated treatment well Patient left: in bed;with call bell/phone within reach     Time: 2025-4270 PT Time Calculation (min): 30 min  Charges:  $Gait Training: 23-37 mins $Therapeutic Exercise: 8-22 mins  G Codes:      BRASFIELD,Eloise Mula,SPTA 04/03/2014, 11:45 AM

## 2014-04-03 NOTE — Progress Notes (Signed)
Seen and agreed 04/03/2014 Jacqualyn Posey PTA [phone] pager [phone] office

## 2014-04-03 NOTE — Consult Note (Signed)
Reason for Consult: Post-operative Urinary Retention  Referring Physician: P. Whitfield MD  [name] is an 79 y.o. male.   HPI:   1 - Post-operative Urinary Retention - Pt s/p right knee replacement with inability to void managed with I+O cath x 48 hours. Placed on tamsulosin and now voiding well x 24 hrs w/o senwation of PVR, UOP excellent today. H/o prior post-op retention and has been on alpha blockers in past and follows with Dr. Risa Grill at baseline.  Today [name] is seen in consultation for above. He is likely going home tomorrow and has no voiding complaints at present.   Past Medical History  Diagnosis Date  . Asthma     only if develops cold  . Recurrent upper respiratory infection (URI) 03/2011  . Kidney stone on left side   . Arthritis   . PVC (premature ventricular contraction)     Past Surgical History  Procedure Laterality Date  . Cardiac catheterization      july 2010-minor irregl  . Kidney stone surgery  2011    R  . Back surgery      x 4  . Total hip arthroplasty      Right  . Total hip revision      Right  . Shoulder arthroscopy      Left  . Wrist surgery      bilateral, plates  . Knee arthroscopy      Left  . Appendectomy    . Eye surgery      bilat cataract  . Incision and drainage hip  07/18/2011    Procedure: IRRIGATION AND DEBRIDEMENT HIP;  Surgeon: Garald Balding, MD;  Location: [facility];  Service: Orthopedics;  Laterality: Right;  RIGHT HIP EXPLORATION, EXCISION OF DEEP BURSAL SAC  . Cholecystectomy      2008  . Cervical spine surgery      x 2  . Total knee arthroplasty Right 03/31/2014    Procedure: TOTAL KNEE ARTHROPLASTY;  Surgeon: Garald Balding, MD;  Location: [facility];  Service: Orthopedics;  Laterality: Right;    Family History  Problem Relation Age of Onset  . Heart disease Mother   . Heart attack Mother   . Anesthesia problems Neg Hx   . Hypotension Neg Hx   . Malignant hyperthermia Neg Hx   . Pseudochol deficiency Neg  Hx     Social History:  reports that he has never smoked. He does not have any smokeless tobacco history on file. He reports that he does not drink alcohol or use illicit drugs.  Allergies:  Allergies  Allergen Reactions  . Tape Other (See Comments)    Adhesive tape blisters  . Erythromycin Rash and Other (See Comments)    unknown  . Sulfa Antibiotics Rash  . Tetracyclines & Related Rash    Medications: I have reviewed the patient's current medications.  Results for orders placed during the hospital encounter of 03/31/14 (from the past 48 hour(s))  CBC     Status: Abnormal   Collection Time    04/02/14  6:18 AM      Result Value Ref Range   WBC 9.8  4.0 - 10.5 K/uL   RBC 3.91 (*) 4.22 - 5.81 MIL/uL   Hemoglobin 12.4 (*) 13.0 - 17.0 g/dL   Comment: CONSISTENT WITH PREVIOUS RESULT   HCT 36.1 (*) 39.0 - 52.0 %   MCV 92.3  78.0 - 100.0 fL   MCH 31.7  26.0 - 34.0 pg  MCHC 34.3  30.0 - 36.0 g/dL   RDW 13.6  11.5 - 15.5 %   Platelets 108 (*) 150 - 400 K/uL   Comment: CONSISTENT WITH PREVIOUS RESULT  BASIC METABOLIC PANEL     Status: Abnormal   Collection Time    04/02/14  6:18 AM      Result Value Ref Range   Sodium 138  137 - 147 mEq/L   Potassium 3.9  3.7 - 5.3 mEq/L   Chloride 98  96 - 112 mEq/L   CO2 28  19 - 32 mEq/L   Glucose, Bld 136 (*) 70 - 99 mg/dL   BUN 12  6 - 23 mg/dL   Creatinine, Ser 0.77  0.50 - 1.35 mg/dL   Calcium 8.4  8.4 - 10.5 mg/dL   GFR calc non Af Amer 86 (*) >90 mL/min   GFR calc Af Amer >90  >90 mL/min   Comment: (NOTE)     The eGFR has been calculated using the CKD EPI equation.     This calculation has not been validated in all clinical situations.     eGFR's persistently <90 mL/min signify possible Chronic Kidney     Disease.   Anion gap 12  5 - 15  CBC     Status: Abnormal   Collection Time    04/03/14  5:20 AM      Result Value Ref Range   WBC 7.0  4.0 - 10.5 K/uL   RBC 3.32 (*) 4.22 - 5.81 MIL/uL   Hemoglobin 10.4 (*) 13.0 - 17.0  g/dL   HCT 30.0 (*) 39.0 - 52.0 %   MCV 90.4  78.0 - 100.0 fL   MCH 31.3  26.0 - 34.0 pg   MCHC 34.7  30.0 - 36.0 g/dL   RDW 13.5  11.5 - 15.5 %   Platelets 95 (*) 150 - 400 K/uL   Comment: CONSISTENT WITH PREVIOUS RESULT  BASIC METABOLIC PANEL     Status: Abnormal   Collection Time    04/03/14  5:20 AM      Result Value Ref Range   Sodium 137  137 - 147 mEq/L   Potassium 4.2  3.7 - 5.3 mEq/L   Chloride 98  96 - 112 mEq/L   CO2 27  19 - 32 mEq/L   Glucose, Bld 131 (*) 70 - 99 mg/dL   BUN 12  6 - 23 mg/dL   Creatinine, Ser 0.72  0.50 - 1.35 mg/dL   Calcium 8.0 (*) 8.4 - 10.5 mg/dL   GFR calc non Af Amer 88 (*) >90 mL/min   GFR calc Af Amer >90  >90 mL/min   Comment: (NOTE)     The eGFR has been calculated using the CKD EPI equation.     This calculation has not been validated in all clinical situations.     eGFR's persistently <90 mL/min signify possible Chronic Kidney     Disease.   Anion gap 12  5 - 15    Dg Chest 2 View  04/02/2014   CLINICAL DATA:  Abdominal its distension  EXAM: CHEST  2 VIEW  COMPARISON:  PA and lateral chest x-ray of March 20, 2014  FINDINGS: The lungs are adequately inflated. There is no focal infiltrate. The cardiac silhouette is top-normal in size. The pulmonary vascularity is not engorged. There is no pleural effusion. The bony thorax exhibits no acute abnormality. No free subdiaphragmatic gas collections are demonstrated.  IMPRESSION: No acute cardiopulmonary abnormality.  Electronically Signed   By: David  Martinique   On: 04/02/2014 16:31   Dg Abd 1 View  04/03/2014   CLINICAL DATA:  Colonic distention  EXAM: ABDOMEN - 1 VIEW  COMPARISON:  04/02/2014  FINDINGS: Scattered large and small bowel gas is noted. No colonic or small bowel dilatation is seen. The degree of gaseous distention has improved slightly in the interval from the prior exam. Postsurgical changes are noted. Degenerative change of the lumbar spine is seen.  IMPRESSION: Persistent large and  small bowel gas although no obstructive changes are seen. Slight improvement is noted.   Electronically Signed   By: Inez Catalina M.D.   On: 04/03/2014 16:59   Dg Abd 1 View  04/02/2014   CLINICAL DATA:  Abdominal distention status post knee replacement 2 days ago  EXAM: ABDOMEN - 1 VIEW  COMPARISON:  None.  FINDINGS: There is gaseous distention of the colon. There is a moderate amount of stool in the ascending colon. There is no small bowel dilatation to suggest obstruction. There is no evidence of pneumoperitoneum, portal venous gas or pneumatosis. There are no pathologic calcifications along the expected course of the ureters.  There is a right total hip arthroplasty. 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  IMPRESSION: Gaseous distention of the colon as can be seen with an ileus given of the patient is status post surgery.   Electronically Signed   By: Kathreen Devoid   On: 04/02/2014 16:31    Review of Systems  Constitutional: Negative.  Negative for fever and chills.  HENT: Negative.   Eyes: Negative.   Respiratory: Negative.   Cardiovascular: Negative.   Gastrointestinal: Negative.  Negative for nausea and vomiting.  Genitourinary: Negative.  Negative for hematuria and flank pain.  Musculoskeletal: Negative.   Skin: Negative.   Neurological: Negative.   Endo/Heme/Allergies: Negative.   Psychiatric/Behavioral: Negative.    Blood pressure 120/60, pulse 81, temperature 98.6 F (37 C), temperature source Oral, resp. rate 18, height _0  (1.93 m), weight 104.327 kg (230 lb), SpO2 95.00%. Physical Exam  Constitutional: He is oriented to person, place, and time. He appears well-developed and well-nourished.  Family at bedside  HENT:  Head: Normocephalic and atraumatic.  Eyes: EOM are normal. Pupils are equal, round, and reactive to light.  Neck: Normal range of motion. Neck supple.  Cardiovascular: Normal rate.   Respiratory: Effort normal.  GI: Soft. Bowel sounds are normal.   Genitourinary: Penis normal.  Somewhat buried penis.   Musculoskeletal:  RLE knee brace in place  Neurological: He is alert and oriented to person, place, and time.  Skin: Skin is warm and dry.  Psychiatric: He has a normal mood and affect. His behavior is normal. Judgment and thought content normal.    Assessment/Plan:  1 - Post-operative Urinary Retention - likely 2/2 baseline moderate outlet obstruction + anticholinergic med effects in post-op setting, now resolved clinically. Agree with continued tamsulosin at discharge. We will arrange for outpatient follow-up in a few weeks to verify emptying well and discuss further the role of ongoing medical therapy.  2 - Call with questions.   [name] 04/03/2014, 5:26 PM

## 2014-04-03 NOTE — Discharge Summary (Signed)
[name], MD   Biagio Borg, PA-C [address], [place],   [address]                             [phone]  PATIENT ID: [name]        MRN:  [id number]          DOB/AGE: 77-Nov-1936 / 77 y.o.    DISCHARGE SUMMARY  ADMISSION DATE:    03/31/2014 DISCHARGE DATE:   04/04/2014   ADMISSION DIAGNOSIS: RIGHT KNEE OSTEOARTHRITIS    DISCHARGE DIAGNOSIS:  RIGHT KNEE OSTEOARTHRITIS    ADDITIONAL DIAGNOSIS: Principal Problem:   Osteoarthritis of right knee Active Problems:   S/P total knee replacement using cement   Urinary retention due to benign prostatic hyperplasia   Ileus, postoperative  Past Medical History  Diagnosis Date  . Asthma     only if develops cold  . Recurrent upper respiratory infection (URI) 03/2011  . Kidney stone on left side   . Arthritis   . PVC (premature ventricular contraction)     PROCEDURE: Procedure(s): TOTAL KNEE ARTHROPLASTY Right on 03/31/2014  CONSULTS: Urology Treatment Team:  Alexis Frock, MD   HISTORY: [name] is a very pleasant 79 year old white male who is seen today for evaluation of his right knee. He has been having problems with his right knee dating back several years. He did have an arthroscopic debridement at the end of 2014 and was noted to have significant arthritis in the knee. He has had recurrent effusions and has undergone several aspirations and corticosteroid injections. He was tried on Voltaren as an anti-inflammatory but continues to have difficulties. He has also used over-the-counter Advil and Aleve. He comes back with persistent effusion and despite the Depo-Medrol still has difficulties. He is now to the point where he is having unbearable pain, and he has pain with every step as well as nighttime pain and pain with activities of daily living. He limps and actually does have to use an assistive device at times. He is seen today for reevaluation.   HOSPITAL COURSE:  RAYGEN DAHM is a 79 y.o.  admitted on 03/31/2014 and found to have a diagnosis of [place].  After appropriate laboratory studies were obtained  they were taken to the operating room on 03/31/2014 and underwent  Procedure(s): TOTAL KNEE ARTHROPLASTY  Right.   They were given perioperative antibiotics:  Anti-infectives   Start     Dose/Rate Route Frequency Ordered Stop   03/31/14 1230  ceFAZolin (ANCEF) IVPB 2 g/50 mL premix     2 g 100 mL/hr over 30 Minutes Intravenous Every 6 hours 03/31/14 1151 03/31/14 1814   03/31/14 0600  ceFAZolin (ANCEF) IVPB 2 g/50 mL premix     2 g 100 mL/hr over 30 Minutes Intravenous On call to O.R. 03/30/14 1422 03/31/14 0745    .  Tolerated the procedure well.  Placed with a foley intraoperatively.    Toradol was given post op.  POD #1, allowed out of bed to a chair.  PT for ambulation and exercise program.  Foley D/C'd in morning.  IV saline locked.  O2 discontionued.  POD #2, continued PT and ambulation.   Hemovac pulled. Had problem with urinary retention requiring straight cath.  Developed abdominal distension.  Pain not well controlled.  Oxycodone stopped and Dilaudid began.  Diet to sips and chips. Dulcolax suppository.  POD #3, did better in PT.  Started Flomax. Urology consult. He was not seen by Urologist but we were called by them and told to put a catheter in and send him to them.  He began to have improvement to the Flomax and foley was not placed.    Advancing diet tolerated.  POD #4, improved and was discharged.  The remainder of the hospital course was dedicated to ambulation and strengthening.   The patient was discharged on 4 Days Post-Op in  Stable condition.  Blood products given:none  DIAGNOSTIC STUDIES: Recent vital signs: No data found.      Recent laboratory studies:  Recent Labs  04/01/14 0530 04/02/14 0618 04/03/14 0520  WBC 11.7* 9.8 7.0  HGB 12.5* 12.4* 10.4*  HCT 36.7* 36.1* 30.0*  PLT 115* 108* 95*    Recent Labs   04/01/14 0530 04/02/14 0618 04/03/14 0520  NA 141 138 137  K 4.4 3.9 4.2  CL 102 98 98  CO2 27 28 27   BUN 13 12 12   CREATININE 0.80 0.77 0.72  GLUCOSE 125* 136* 131*  CALCIUM 8.3* 8.4 8.0*   Lab Results  Component Value Date   INR 1.08 03/20/2014   INR 1.00 07/12/2011   INR 1.33 02/10/2011     Recent Radiographic Studies :  Dg Chest 2 View  04/02/2014   CLINICAL DATA:  Abdominal its distension  EXAM: CHEST  2 VIEW  COMPARISON:  PA and lateral chest x-ray of March 20, 2014  FINDINGS: The lungs are adequately inflated. There is no focal infiltrate. The cardiac silhouette is top-normal in size. The pulmonary vascularity is not engorged. There is no pleural effusion. The bony thorax exhibits no acute abnormality. No free subdiaphragmatic gas collections are demonstrated.  IMPRESSION: No acute cardiopulmonary abnormality.   Electronically Signed   By: David  Martinique   On: 04/02/2014 16:31   Chest 2 View  03/20/2014   CLINICAL DATA:  Preoperative respiratory evaluation for knee replacement  EXAM: CHEST  2 VIEW  COMPARISON:  11/26/2009  FINDINGS: No focal airspace consolidation. No pulmonary edema or pleural effusion. Cardiopericardial silhouette is at upper limits of normal for size. Interstitial markings are diffusely coarsened with chronic features. Imaged bony structures of the thorax are intact.  IMPRESSION: Stable.  No acute cardiopulmonary process.   Electronically Signed   By: Misty Stanley M.D.   On: 03/20/2014 09:37   Dg Abd 1 View  04/02/2014   CLINICAL DATA:  Abdominal distention status post knee replacement 2 days ago  EXAM: ABDOMEN - 1 VIEW  COMPARISON:  None.  FINDINGS: There is gaseous distention of the colon. There is a moderate amount of stool in the ascending colon. There is no small bowel dilatation to suggest obstruction. There is no evidence of pneumoperitoneum, portal venous gas or pneumatosis. There are no pathologic calcifications along the expected course of the ureters.   There is a right total hip arthroplasty. 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  IMPRESSION: Gaseous distention of the colon as can be seen with an ileus given of the patient is status post surgery.   Electronically Signed   By: Kathreen Devoid   On: 04/02/2014 16:31    DISCHARGE INSTRUCTIONS:     Discharge Instructions   CPM    Complete by:  As directed   Continuous passive motion machine (CPM):      Use the CPM from 0 to 60 for 6-8 hours per day.      You may increase by 5-10 per  day.  You may break it up into 2 or 3 sessions per day.      Use CPM for 3-4 weeks or until you are told to stop.     Call MD / Call 911    Complete by:  As directed   If you experience chest pain or shortness of breath, CALL 911 and be transported to the hospital emergency room.  If you develop a fever above 101 F, pus (white drainage) or increased drainage or redness at the wound, or calf pain, call your surgeon's office.     Call MD / Call 911    Complete by:  As directed   If you experience chest pain or shortness of breath, CALL 911 and be transported to the hospital emergency room.  If you develope a fever above 101 F, pus (white drainage) or increased drainage or redness at the wound, or calf pain, call your surgeon's office.     Change dressing    Complete by:  As directed   Change dressing on SUNDAY, then change the dressing daily with sterile 4 x 4 inch gauze dressing and apply TED hose.  You may clean the incision with alcohol prior to redressing.     Constipation Prevention    Complete by:  As directed   Drink plenty of fluids.  Prune juice and/or coffee may be helpful.  You may use a stool softener, such as Colace (over the counter) 100 mg twice a day.  Use MiraLax (over the counter) for constipation as needed but this may take several days to work.  Mag Citrate --OR-- Milk of Magnesia --OR -- Dulcolax pills/suppositories may also be used but follow directions on the label.     Constipation  Prevention    Complete by:  As directed   Drink plenty of fluids.  Prune juice may be helpful.  You may use a stool softener, such as Colace (over the counter) 100 mg twice a day.  Use MiraLax (over the counter) for constipation as needed.     Diet - low sodium heart healthy    Complete by:  As directed      Diet general    Complete by:  As directed      Discharge instructions    Complete by:  As directed   YOU WERE GIVEN A DEVICE CALLED AN INCENTIVE SPIROMETER TO HELP YOU TAKE DEEP BREATHS.  PLEASE USE THIS AT LEAST TEN (10) TIMES EVERY 1-2 HOURS EVERY DAY TO PREVENT PNEUMONIA.     Do not put a pillow under the knee. Place it under the heel.    Complete by:  As directed      Driving restrictions    Complete by:  As directed   No driving for 6 weeks     Increase activity slowly as tolerated    Complete by:  As directed      Increase activity slowly as tolerated    Complete by:  As directed      Lifting restrictions    Complete by:  As directed   No lifting for 6 weeks     Partial weight bearing    Complete by:  As directed   50 % WEIGHT BEARING AS TAUGHT IN PHYSICAL THERAPY     Patient may shower    Complete by:  As directed   You may shower over the brown dressing.  Once the dressing is removed you may shower without a dressing once there is  no drainage.  Do not wash over the wound.  If drainage remains, cover wound with plastic wrap and then shower.     TED hose    Complete by:  As directed   Use stockings (TED hose) for 2 weeks on operative leg(s).  You may remove them at night for sleeping.           DISCHARGE MEDICATIONS:     Medication List    STOP taking these medications       aspirin 325 MG tablet      TAKE these medications       HYDROmorphone 2 MG tablet  Commonly known as:  DILAUDID  Take 1 tablet (2 mg total) by mouth every 4 (four) hours as needed for severe pain (1 - 2 TABLETS Q 4H PRN PAIN).     methocarbamol 500 MG tablet  Commonly known as:   ROBAXIN  Take 1 tablet (500 mg total) by mouth every 8 (eight) hours as needed for muscle spasms.     metoprolol tartrate 25 MG tablet  Commonly known as:  LOPRESSOR  Take 25 mg by mouth daily.     omeprazole 40 MG capsule  Commonly known as:  PRILOSEC  Take 40 mg by mouth daily.     potassium chloride SA 20 MEQ tablet  Commonly known as:  K-DUR,KLOR-CON  Take 20 mEq by mouth daily.     rivaroxaban 10 MG Tabs tablet  Commonly known as:  XARELTO  Take 1 tablet (10 mg total) by mouth daily.     tamsulosin 0.4 MG Caps capsule  Commonly known as:  FLOMAX  Take 1 capsule (0.4 mg total) by mouth daily after breakfast.     vitamin C 500 MG tablet  Commonly known as:  ASCORBIC ACID  Take 1 tablet (500 mg total) by mouth daily.        FOLLOW UP VISIT:   Follow-up Information   Schedule an appointment as soon as possible for a visit with [facility] [place], Vonna Kotyk, MD.   Specialty:  Orthopedic Surgery   Contact information:   [place] [place] [address] [phone]       Follow up On 04/13/2014.      DISPOSITION:   Home  CONDITION:  Stable   Aaron Edelman D. [place], [place] [phone]  04/06/2014 8:44 AM

## 2014-04-03 NOTE — Progress Notes (Signed)
Physical Therapy Treatment Patient Details Name: [name] MRN: [id number] DOB: Jan 21, 1935 Today's Date: 04/03/2014    History of Present Illness Admitted for elective R TKA    PT Comments    Pt able to amb much further this session. Pt limited by weakness and pain and stiffness, but no nausea or dizziness. Planning stair training next session, depending on pts D/C status.   Follow Up Recommendations  Home health PT     Equipment Recommendations  None recommended by PT    Recommendations for Other Services       Precautions / Restrictions Precautions Precautions: Knee Restrictions RLE Weight Bearing: Partial weight bearing RLE Partial Weight Bearing Percentage or Pounds: 50%    Mobility  Bed Mobility Overal bed mobility: Needs Assistance Bed Mobility: Supine to Sit     Supine to sit: HOB elevated;Min assist     General bed mobility comments: min assist to come to sitting and to move leg to EOB. Uses hand rails and trapeze bar to pull self to sitting.   Transfers Overall transfer level: Needs assistance Equipment used: Rolling walker (2 wheeled) Transfers: Sit to/from Stand Sit to Stand: Min guard         General transfer comment: min guard for safety. good hand placement and pt able to power up to standing independently with walker.   Ambulation/Gait Ambulation/Gait assistance: Min guard Ambulation Distance (Feet): 100 Feet Assistive device: Rolling walker (2 wheeled) Gait Pattern/deviations: Step-to pattern;Decreased stride length;Narrow base of support Gait velocity: decreased Gait velocity interpretation: Below normal speed for age/gender General Gait Details: Good control of walker and sequencing.    Stairs            Wheelchair Mobility    Modified Rankin (Stroke Patients Only)       Balance                                    Cognition Arousal/Alertness: Awake/alert Behavior During Therapy: WFL for tasks  assessed/performed Overall Cognitive Status: Within Functional Limits for tasks assessed                      Exercises Total Joint Exercises Ankle Circles/Pumps: AROM;Seated;20 reps Quad Sets: AROM;Seated;10 reps Hip ABduction/ADduction: AAROM;Seated;10 reps Straight Leg Raises: AAROM;Seated;5 reps Long Arc Quad: AAROM;Seated;5 reps Knee Flexion: PROM;Seated;10 reps    General Comments        Pertinent Vitals/Pain no apparent distress. Pt repositioned in recliner for comfort with heel propped for R knee extension.      Home Living                      Prior Function            PT Goals (current goals can now be found in the care plan section) Progress towards PT goals: Progressing toward goals    Frequency  7X/week    PT Plan      Co-evaluation             End of Session Equipment Utilized During Treatment: Gait belt Activity Tolerance: Patient tolerated treatment well;Patient limited by fatigue;Patient limited by pain Patient left: in chair;with call bell/phone within reach     Time: 0801-0844 PT Time Calculation (min): 43 min  Charges:  G Codes:      BRASFIELD,Isamu Trammel,SPTA 04/03/2014, 8:52 AM

## 2014-04-04 NOTE — Discharge Summary (Signed)
Physician Discharge Summary  Patient ID: CEZAR MISIASZEK MRN: [id number] DOB/AGE: February 22, 1935 79 y.o.  Admit date: 03/31/2014 Discharge date: 04/04/2014  Admission Diagnoses: Osteoarthritis knee  Discharge Diagnoses:  Principal Problem:   Osteoarthritis of right knee Active Problems:   S/P total knee replacement using cement   Urinary retention due to benign prostatic hyperplasia   Ileus, postoperative   Discharged Condition: stable  Hospital Course: Patient's hospital course was essentially unremarkable. Underwent total knee arthroplasty. Postoperatively he did have urinary retention and this resolved at the time of discharge.  Consults: urology  Significant Diagnostic Studies: labs: Routine labs  Treatments: surgery: See operative note  Discharge Exam: Blood pressure 125/61, pulse 88, temperature 97.9 F (36.6 C), temperature source Oral, resp. rate 18, height 6\' 4"  (1.93 m), weight 104.327 kg (230 lb), SpO2 94.00%. Incision/Wound: incision clean and dry  Disposition: 01-Home or Self Care  Discharge Instructions   CPM    Complete by:  As directed   Continuous passive motion machine (CPM):      Use the CPM from 0 to 60 for 6-8 hours per day.      You may increase by 5-10 per day.  You may break it up into 2 or 3 sessions per day.      Use CPM for 3-4 weeks or until you are told to stop.     Call MD / Call 911    Complete by:  As directed   If you experience chest pain or shortness of breath, CALL 911 and be transported to the hospital emergency room.  If you develop a fever above 101 F, pus (white drainage) or increased drainage or redness at the wound, or calf pain, call your surgeon's office.     Change dressing    Complete by:  As directed   Change dressing on SUNDAY, then change the dressing daily with sterile 4 x 4 inch gauze dressing and apply TED hose.  You may clean the incision with alcohol prior to redressing.     Constipation Prevention    Complete by:  As  directed   Drink plenty of fluids.  Prune juice and/or coffee may be helpful.  You may use a stool softener, such as Colace (over the counter) 100 mg twice a day.  Use MiraLax (over the counter) for constipation as needed but this may take several days to work.  Mag Citrate --OR-- Milk of Magnesia --OR -- Dulcolax pills/suppositories may also be used but follow directions on the label.     Diet general    Complete by:  As directed      Discharge instructions    Complete by:  As directed   YOU WERE GIVEN A DEVICE CALLED AN INCENTIVE SPIROMETER TO HELP YOU TAKE DEEP BREATHS.  PLEASE USE THIS AT LEAST TEN (10) TIMES EVERY 1-2 HOURS EVERY DAY TO PREVENT PNEUMONIA.     Do not put a pillow under the knee. Place it under the heel.    Complete by:  As directed      Driving restrictions    Complete by:  As directed   No driving for 6 weeks     Increase activity slowly as tolerated    Complete by:  As directed      Lifting restrictions    Complete by:  As directed   No lifting for 6 weeks     Partial weight bearing    Complete by:  As directed   50 % WEIGHT  BEARING AS TAUGHT IN PHYSICAL THERAPY     Patient may shower    Complete by:  As directed   You may shower over the brown dressing.  Once the dressing is removed you may shower without a dressing once there is no drainage.  Do not wash over the wound.  If drainage remains, cover wound with plastic wrap and then shower.     TED hose    Complete by:  As directed   Use stockings (TED hose) for 2 weeks on operative leg(s).  You may remove them at night for sleeping.            Medication List    STOP taking these medications       aspirin 325 MG tablet      TAKE these medications       HYDROmorphone 2 MG tablet  Commonly known as:  DILAUDID  Take 1 tablet (2 mg total) by mouth every 4 (four) hours as needed for severe pain (1 - 2 TABLETS Q 4H PRN PAIN).     methocarbamol 500 MG tablet  Commonly known as:  ROBAXIN  Take 1 tablet  (500 mg total) by mouth every 8 (eight) hours as needed for muscle spasms.     metoprolol tartrate 25 MG tablet  Commonly known as:  LOPRESSOR  Take 25 mg by mouth daily.     omeprazole 40 MG capsule  Commonly known as:  PRILOSEC  Take 40 mg by mouth daily.     potassium chloride SA 20 MEQ tablet  Commonly known as:  K-DUR,KLOR-CON  Take 20 mEq by mouth daily.     rivaroxaban 10 MG Tabs tablet  Commonly known as:  XARELTO  Take 1 tablet (10 mg total) by mouth daily.     tamsulosin 0.4 MG Caps capsule  Commonly known as:  FLOMAX  Take 1 capsule (0.4 mg total) by mouth daily after breakfast.     vitamin C 500 MG tablet  Commonly known as:  ASCORBIC ACID  Take 1 tablet (500 mg total) by mouth daily.           Follow-up Information   Schedule an appointment as soon as possible for a visit with [facility], Vonna Kotyk, MD.   Specialty:  Orthopedic Surgery   Contact information:   [address] [place] [address] [phone]       Follow up On 04/13/2014.      Signed: Reshma Hoey V 04/04/2014, 7:52 AM

## 2014-04-04 NOTE — Progress Notes (Signed)
Physical Therapy Treatment Patient Details Name: ELIM ECONOMOU MRN: [id number] DOB: 1935-07-02 Today's Date: 04/04/2014    History of Present Illness Admitted for elective R TKA    PT Comments    Pt moving very well today.  Able to increase amb distance and perform stair gait.  Pt ready for D/C from PT stand point.    Follow Up Recommendations  Home health PT;Supervision - Intermittent     Equipment Recommendations  None recommended by PT    Recommendations for Other Services       Precautions / Restrictions Precautions Precautions: Knee Restrictions Weight Bearing Restrictions: Yes RLE Weight Bearing: Partial weight bearing RLE Partial Weight Bearing Percentage or Pounds: 50%    Mobility  Bed Mobility Overal bed mobility: Needs Assistance Bed Mobility: Supine to Sit     Supine to sit: Min assist;HOB elevated     General bed mobility comments: A with R LE only.    Transfers Overall transfer level: Needs assistance Equipment used: Rolling walker (2 wheeled) Transfers: Sit to/from Stand Sit to Stand: Supervision         General transfer comment: Demos good UE use.    Ambulation/Gait Ambulation/Gait assistance: Supervision Ambulation Distance (Feet): 200 Feet Assistive device: Rolling walker (2 wheeled) Gait Pattern/deviations: Step-to pattern;Decreased step length - left;Decreased stance time - right;Decreased stride length   Gait velocity interpretation: Below normal speed for age/gender General Gait Details: Demos good [place] of RW and able to improve amb distance.     Stairs Stairs: Yes Stairs assistance: Min guard Stair Management: One rail Left;Step to pattern;Forwards Number of Stairs: 3 General stair comments: Cues for sequencing.    Wheelchair Mobility    Modified Rankin (Stroke Patients Only)       Balance                                    Cognition Arousal/Alertness: Awake/alert Behavior During Therapy: WFL for  tasks assessed/performed Overall Cognitive Status: Within Functional Limits for tasks assessed                      Exercises Total Joint Exercises Long Arc Quad: AROM;Right;10 reps Knee Flexion: AROM;Right;10 reps Goniometric ROM: ~ 5 - 90    General Comments        Pertinent Vitals/Pain "Not bad" pt medicated at beginning of session.      Home Living                      Prior Function            PT Goals (current goals can now be found in the care plan section) Acute Rehab PT Goals PT Goal Formulation: With patient Time For Goal Achievement: 04/07/14 Potential to Achieve Goals: Good Progress towards PT goals: Progressing toward goals    Frequency  7X/week    PT Plan Current plan remains appropriate    Co-evaluation             End of Session Equipment Utilized During Treatment: Gait belt Activity Tolerance: Patient tolerated treatment well Patient left: in bed;with call bell/phone within reach     Time: 0815-0843 PT Time Calculation (min): 28 min  Charges:  $Gait Training: 23-37 mins                    G Codes:      Kariel Skillman,  Thornton Papas, Edgar 04/04/2014, 11:54 AM

## 2014-04-05 DIAGNOSIS — J45909 Unspecified asthma, uncomplicated: Secondary | ICD-10-CM | POA: Diagnosis not present

## 2014-04-05 DIAGNOSIS — IMO0001 Reserved for inherently not codable concepts without codable children: Secondary | ICD-10-CM | POA: Diagnosis not present

## 2014-04-05 DIAGNOSIS — Z471 Aftercare following joint replacement surgery: Secondary | ICD-10-CM | POA: Diagnosis not present

## 2014-04-05 DIAGNOSIS — R269 Unspecified abnormalities of gait and mobility: Secondary | ICD-10-CM | POA: Diagnosis not present

## 2014-04-05 DIAGNOSIS — Z7901 Long term (current) use of anticoagulants: Secondary | ICD-10-CM | POA: Diagnosis not present

## 2014-04-05 DIAGNOSIS — Z96659 Presence of unspecified artificial knee joint: Secondary | ICD-10-CM | POA: Diagnosis not present

## 2014-04-06 DIAGNOSIS — J45909 Unspecified asthma, uncomplicated: Secondary | ICD-10-CM | POA: Diagnosis not present

## 2014-04-06 DIAGNOSIS — Z471 Aftercare following joint replacement surgery: Secondary | ICD-10-CM | POA: Diagnosis not present

## 2014-04-06 DIAGNOSIS — IMO0001 Reserved for inherently not codable concepts without codable children: Secondary | ICD-10-CM | POA: Diagnosis not present

## 2014-04-06 DIAGNOSIS — Z96659 Presence of unspecified artificial knee joint: Secondary | ICD-10-CM | POA: Diagnosis not present

## 2014-04-06 DIAGNOSIS — R269 Unspecified abnormalities of gait and mobility: Secondary | ICD-10-CM | POA: Diagnosis not present

## 2014-04-06 DIAGNOSIS — Z7901 Long term (current) use of anticoagulants: Secondary | ICD-10-CM | POA: Diagnosis not present

## 2014-04-06 NOTE — Care Management Note (Signed)
CARE MANAGEMENT NOTE 04/06/2014  Patient:  [name], [name]   Account Number:  [id number]  Date Initiated:  03/31/2014  Documentation initiated by:  Ricki Miller  Subjective/Objective Assessment:   79 yr old male s/p right total knee arthroplasty.     Action/Plan:   PT/OT eval  patient preoperatively setup with [facility].  Case Manager will continue to monitor.   Anticipated DC Date:  04/02/2014   Anticipated DC Plan:  [place]  CM consult      [facility] Choice  HOME HEALTH   Choice offered to / List presented to:        DME agency  [employer]     [place] arranged  [facility] PT      [place]   Status of service:  Completed, signed off Medicare Important Message given?   (If response is "NO", the following Medicare IM given date fields will be blank) Date Medicare IM given:   Medicare IM given by:   Date Additional Medicare IM given:   Additional Medicare IM given by:    Discharge Disposition:  [place]  Per UR Regulation:  Reviewed for med. necessity/level of care/duration of stay

## 2014-04-07 DIAGNOSIS — Z96659 Presence of unspecified artificial knee joint: Secondary | ICD-10-CM | POA: Diagnosis not present

## 2014-04-07 DIAGNOSIS — R269 Unspecified abnormalities of gait and mobility: Secondary | ICD-10-CM | POA: Diagnosis not present

## 2014-04-07 DIAGNOSIS — J45909 Unspecified asthma, uncomplicated: Secondary | ICD-10-CM | POA: Diagnosis not present

## 2014-04-07 DIAGNOSIS — IMO0001 Reserved for inherently not codable concepts without codable children: Secondary | ICD-10-CM | POA: Diagnosis not present

## 2014-04-07 DIAGNOSIS — Z7901 Long term (current) use of anticoagulants: Secondary | ICD-10-CM | POA: Diagnosis not present

## 2014-04-07 DIAGNOSIS — Z471 Aftercare following joint replacement surgery: Secondary | ICD-10-CM | POA: Diagnosis not present

## 2014-04-08 DIAGNOSIS — R269 Unspecified abnormalities of gait and mobility: Secondary | ICD-10-CM | POA: Diagnosis not present

## 2014-04-08 DIAGNOSIS — Z471 Aftercare following joint replacement surgery: Secondary | ICD-10-CM | POA: Diagnosis not present

## 2014-04-08 DIAGNOSIS — Z7901 Long term (current) use of anticoagulants: Secondary | ICD-10-CM | POA: Diagnosis not present

## 2014-04-08 DIAGNOSIS — J45909 Unspecified asthma, uncomplicated: Secondary | ICD-10-CM | POA: Diagnosis not present

## 2014-04-08 DIAGNOSIS — Z96659 Presence of unspecified artificial knee joint: Secondary | ICD-10-CM | POA: Diagnosis not present

## 2014-04-08 DIAGNOSIS — IMO0001 Reserved for inherently not codable concepts without codable children: Secondary | ICD-10-CM | POA: Diagnosis not present

## 2014-04-09 DIAGNOSIS — Z471 Aftercare following joint replacement surgery: Secondary | ICD-10-CM | POA: Diagnosis not present

## 2014-04-09 DIAGNOSIS — Z7901 Long term (current) use of anticoagulants: Secondary | ICD-10-CM | POA: Diagnosis not present

## 2014-04-09 DIAGNOSIS — J45909 Unspecified asthma, uncomplicated: Secondary | ICD-10-CM | POA: Diagnosis not present

## 2014-04-09 DIAGNOSIS — R269 Unspecified abnormalities of gait and mobility: Secondary | ICD-10-CM | POA: Diagnosis not present

## 2014-04-09 DIAGNOSIS — Z96659 Presence of unspecified artificial knee joint: Secondary | ICD-10-CM | POA: Diagnosis not present

## 2014-04-09 DIAGNOSIS — IMO0001 Reserved for inherently not codable concepts without codable children: Secondary | ICD-10-CM | POA: Diagnosis not present

## 2014-04-10 DIAGNOSIS — Z96659 Presence of unspecified artificial knee joint: Secondary | ICD-10-CM | POA: Diagnosis not present

## 2014-04-10 DIAGNOSIS — Z471 Aftercare following joint replacement surgery: Secondary | ICD-10-CM | POA: Diagnosis not present

## 2014-04-10 DIAGNOSIS — R269 Unspecified abnormalities of gait and mobility: Secondary | ICD-10-CM | POA: Diagnosis not present

## 2014-04-10 DIAGNOSIS — IMO0001 Reserved for inherently not codable concepts without codable children: Secondary | ICD-10-CM | POA: Diagnosis not present

## 2014-04-10 DIAGNOSIS — J45909 Unspecified asthma, uncomplicated: Secondary | ICD-10-CM | POA: Diagnosis not present

## 2014-04-10 DIAGNOSIS — Z7901 Long term (current) use of anticoagulants: Secondary | ICD-10-CM | POA: Diagnosis not present

## 2014-04-13 DIAGNOSIS — Z471 Aftercare following joint replacement surgery: Secondary | ICD-10-CM | POA: Diagnosis not present

## 2014-04-13 DIAGNOSIS — IMO0001 Reserved for inherently not codable concepts without codable children: Secondary | ICD-10-CM | POA: Diagnosis not present

## 2014-04-13 DIAGNOSIS — M1710 Unilateral primary osteoarthritis, unspecified knee: Secondary | ICD-10-CM | POA: Diagnosis not present

## 2014-04-13 DIAGNOSIS — R269 Unspecified abnormalities of gait and mobility: Secondary | ICD-10-CM | POA: Diagnosis not present

## 2014-04-13 DIAGNOSIS — Z7901 Long term (current) use of anticoagulants: Secondary | ICD-10-CM | POA: Diagnosis not present

## 2014-04-13 DIAGNOSIS — J45909 Unspecified asthma, uncomplicated: Secondary | ICD-10-CM | POA: Diagnosis not present

## 2014-04-13 DIAGNOSIS — Z96659 Presence of unspecified artificial knee joint: Secondary | ICD-10-CM | POA: Diagnosis not present

## 2014-04-14 DIAGNOSIS — R269 Unspecified abnormalities of gait and mobility: Secondary | ICD-10-CM | POA: Diagnosis not present

## 2014-04-14 DIAGNOSIS — Z96659 Presence of unspecified artificial knee joint: Secondary | ICD-10-CM | POA: Diagnosis not present

## 2014-04-14 DIAGNOSIS — Z471 Aftercare following joint replacement surgery: Secondary | ICD-10-CM | POA: Diagnosis not present

## 2014-04-14 DIAGNOSIS — J45909 Unspecified asthma, uncomplicated: Secondary | ICD-10-CM | POA: Diagnosis not present

## 2014-04-14 DIAGNOSIS — IMO0001 Reserved for inherently not codable concepts without codable children: Secondary | ICD-10-CM | POA: Diagnosis not present

## 2014-04-14 DIAGNOSIS — Z7901 Long term (current) use of anticoagulants: Secondary | ICD-10-CM | POA: Diagnosis not present

## 2014-04-15 DIAGNOSIS — Z471 Aftercare following joint replacement surgery: Secondary | ICD-10-CM | POA: Diagnosis not present

## 2014-04-15 DIAGNOSIS — R269 Unspecified abnormalities of gait and mobility: Secondary | ICD-10-CM | POA: Diagnosis not present

## 2014-04-15 DIAGNOSIS — J45909 Unspecified asthma, uncomplicated: Secondary | ICD-10-CM | POA: Diagnosis not present

## 2014-04-15 DIAGNOSIS — Z96659 Presence of unspecified artificial knee joint: Secondary | ICD-10-CM | POA: Diagnosis not present

## 2014-04-15 DIAGNOSIS — IMO0001 Reserved for inherently not codable concepts without codable children: Secondary | ICD-10-CM | POA: Diagnosis not present

## 2014-04-15 DIAGNOSIS — Z7901 Long term (current) use of anticoagulants: Secondary | ICD-10-CM | POA: Diagnosis not present

## 2014-04-16 DIAGNOSIS — J45909 Unspecified asthma, uncomplicated: Secondary | ICD-10-CM | POA: Diagnosis not present

## 2014-04-16 DIAGNOSIS — IMO0001 Reserved for inherently not codable concepts without codable children: Secondary | ICD-10-CM | POA: Diagnosis not present

## 2014-04-16 DIAGNOSIS — Z7901 Long term (current) use of anticoagulants: Secondary | ICD-10-CM | POA: Diagnosis not present

## 2014-04-16 DIAGNOSIS — R269 Unspecified abnormalities of gait and mobility: Secondary | ICD-10-CM | POA: Diagnosis not present

## 2014-04-16 DIAGNOSIS — Z471 Aftercare following joint replacement surgery: Secondary | ICD-10-CM | POA: Diagnosis not present

## 2014-04-16 DIAGNOSIS — Z96659 Presence of unspecified artificial knee joint: Secondary | ICD-10-CM | POA: Diagnosis not present

## 2014-04-17 DIAGNOSIS — R269 Unspecified abnormalities of gait and mobility: Secondary | ICD-10-CM | POA: Diagnosis not present

## 2014-04-17 DIAGNOSIS — IMO0001 Reserved for inherently not codable concepts without codable children: Secondary | ICD-10-CM | POA: Diagnosis not present

## 2014-04-17 DIAGNOSIS — Z471 Aftercare following joint replacement surgery: Secondary | ICD-10-CM | POA: Diagnosis not present

## 2014-04-17 DIAGNOSIS — Z7901 Long term (current) use of anticoagulants: Secondary | ICD-10-CM | POA: Diagnosis not present

## 2014-04-17 DIAGNOSIS — J45909 Unspecified asthma, uncomplicated: Secondary | ICD-10-CM | POA: Diagnosis not present

## 2014-04-17 DIAGNOSIS — Z96659 Presence of unspecified artificial knee joint: Secondary | ICD-10-CM | POA: Diagnosis not present

## 2014-04-20 ENCOUNTER — Ambulatory Visit: Payer: Medicare Other | Attending: Orthopaedic Surgery | Admitting: Physical Therapy

## 2014-04-20 DIAGNOSIS — R609 Edema, unspecified: Secondary | ICD-10-CM | POA: Diagnosis not present

## 2014-04-20 DIAGNOSIS — M25569 Pain in unspecified knee: Secondary | ICD-10-CM | POA: Diagnosis not present

## 2014-04-20 DIAGNOSIS — M25669 Stiffness of unspecified knee, not elsewhere classified: Secondary | ICD-10-CM | POA: Insufficient documentation

## 2014-04-23 ENCOUNTER — Other Ambulatory Visit: Payer: Self-pay | Admitting: Cardiology

## 2014-04-24 ENCOUNTER — Ambulatory Visit: Payer: Medicare Other | Admitting: Physical Therapy

## 2014-04-24 DIAGNOSIS — M25569 Pain in unspecified knee: Secondary | ICD-10-CM | POA: Diagnosis not present

## 2014-04-27 ENCOUNTER — Ambulatory Visit: Payer: Medicare Other | Admitting: Physical Therapy

## 2014-04-27 DIAGNOSIS — M25569 Pain in unspecified knee: Secondary | ICD-10-CM | POA: Diagnosis not present

## 2014-04-29 ENCOUNTER — Ambulatory Visit: Payer: Medicare Other | Admitting: Physical Therapy

## 2014-04-29 DIAGNOSIS — M25569 Pain in unspecified knee: Secondary | ICD-10-CM | POA: Diagnosis not present

## 2014-04-30 ENCOUNTER — Ambulatory Visit: Payer: Medicare Other | Admitting: Physical Therapy

## 2014-04-30 DIAGNOSIS — M25569 Pain in unspecified knee: Secondary | ICD-10-CM | POA: Diagnosis not present

## 2014-05-05 ENCOUNTER — Ambulatory Visit: Payer: Medicare Other | Attending: Orthopaedic Surgery | Admitting: Physical Therapy

## 2014-05-05 DIAGNOSIS — M25569 Pain in unspecified knee: Secondary | ICD-10-CM | POA: Diagnosis not present

## 2014-05-05 DIAGNOSIS — R609 Edema, unspecified: Secondary | ICD-10-CM | POA: Diagnosis not present

## 2014-05-05 DIAGNOSIS — M25669 Stiffness of unspecified knee, not elsewhere classified: Secondary | ICD-10-CM | POA: Insufficient documentation

## 2014-05-07 ENCOUNTER — Ambulatory Visit: Payer: Medicare Other | Admitting: Physical Therapy

## 2014-05-07 DIAGNOSIS — M25569 Pain in unspecified knee: Secondary | ICD-10-CM | POA: Diagnosis not present

## 2014-05-12 ENCOUNTER — Ambulatory Visit: Payer: Medicare Other | Admitting: Physical Therapy

## 2014-05-12 DIAGNOSIS — M25569 Pain in unspecified knee: Secondary | ICD-10-CM | POA: Diagnosis not present

## 2014-05-19 ENCOUNTER — Ambulatory Visit: Payer: Medicare Other | Admitting: Physical Therapy

## 2014-05-19 DIAGNOSIS — M25569 Pain in unspecified knee: Secondary | ICD-10-CM | POA: Diagnosis not present

## 2014-05-20 ENCOUNTER — Ambulatory Visit: Payer: Medicare Other | Admitting: Physical Therapy

## 2014-05-26 ENCOUNTER — Ambulatory Visit: Payer: Medicare Other | Admitting: Physical Therapy

## 2014-05-26 DIAGNOSIS — M25569 Pain in unspecified knee: Secondary | ICD-10-CM | POA: Diagnosis not present

## 2014-06-26 ENCOUNTER — Other Ambulatory Visit (INDEPENDENT_AMBULATORY_CARE_PROVIDER_SITE_OTHER): Payer: Medicare Other | Admitting: *Deleted

## 2014-06-26 DIAGNOSIS — I119 Hypertensive heart disease without heart failure: Secondary | ICD-10-CM

## 2014-06-26 DIAGNOSIS — E785 Hyperlipidemia, unspecified: Secondary | ICD-10-CM | POA: Diagnosis not present

## 2014-06-26 LAB — BASIC METABOLIC PANEL
BUN: 14 mg/dL (ref 6–23)
CO2: 19 mEq/L (ref 19–32)
CREATININE: 0.9 mg/dL (ref 0.4–1.5)
Calcium: 8.9 mg/dL (ref 8.4–10.5)
Chloride: 107 mEq/L (ref 96–112)
GFR: 86.95 mL/min (ref >60.00)
Glucose, Bld: 111 mg/dL — ABNORMAL HIGH (ref 70–99)
Potassium: 4 mEq/L (ref 3.5–5.1)
Sodium: 140 mEq/L (ref 135–145)

## 2014-06-26 LAB — LIPID PANEL
CHOLESTEROL: 164 mg/dL (ref 0–200)
HDL: 43.8 mg/dL (ref >39.00)
LDL CALC: 97 mg/dL (ref 0–99)
NonHDL: 120.2
TRIGLYCERIDES: 116 mg/dL (ref 0.0–149.0)
Total CHOL/HDL Ratio: 4
VLDL: 23.2 mg/dL (ref 0.0–40.0)

## 2014-06-26 LAB — HEPATIC FUNCTION PANEL
ALK PHOS: 55 U/L (ref 39–117)
ALT: 16 U/L (ref 0–53)
AST: 21 U/L (ref 0–37)
Albumin: 3.5 g/dL (ref 3.5–5.2)
BILIRUBIN DIRECT: 0.2 mg/dL (ref 0.0–0.3)
TOTAL PROTEIN: 6.9 g/dL (ref 6.0–8.3)
Total Bilirubin: 1.1 mg/dL (ref 0.2–1.2)

## 2014-06-26 NOTE — Progress Notes (Signed)
Quick Note:  Please make copy of labs for patient visit. ______ 

## 2014-06-29 ENCOUNTER — Ambulatory Visit (INDEPENDENT_AMBULATORY_CARE_PROVIDER_SITE_OTHER): Payer: Medicare Other | Admitting: Cardiology

## 2014-06-29 ENCOUNTER — Encounter: Payer: Self-pay | Admitting: Cardiology

## 2014-06-29 VITALS — BP 116/80 | HR 57 | Ht 76.0 in | Wt 228.0 lb

## 2014-06-29 DIAGNOSIS — E785 Hyperlipidemia, unspecified: Secondary | ICD-10-CM

## 2014-06-29 DIAGNOSIS — R1013 Epigastric pain: Secondary | ICD-10-CM | POA: Diagnosis not present

## 2014-06-29 DIAGNOSIS — J4520 Mild intermittent asthma, uncomplicated: Secondary | ICD-10-CM

## 2014-06-29 DIAGNOSIS — I119 Hypertensive heart disease without heart failure: Secondary | ICD-10-CM | POA: Diagnosis not present

## 2014-06-29 DIAGNOSIS — J45909 Unspecified asthma, uncomplicated: Secondary | ICD-10-CM | POA: Insufficient documentation

## 2014-06-29 MED ORDER — ALBUTEROL SULFATE HFA 108 (90 BASE) MCG/ACT IN AERS: 2.0000 | INHALATION_SPRAY | Freq: Four times a day (QID) | RESPIRATORY_TRACT | Status: DC | PRN

## 2014-06-29 MED ORDER — PANTOPRAZOLE SODIUM 40 MG PO TBEC: 40.0000 mg | DELAYED_RELEASE_TABLET | Freq: Every day | ORAL | Status: DC

## 2014-06-29 NOTE — Assessment & Plan Note (Signed)
He expects to have problems with respiratory wheezing when the weather becomes cold.  We refilled his albuterol nebulizer to have on hand.

## 2014-06-29 NOTE — Patient Instructions (Signed)
STOP YOUR OMEPRAZOLE  START PROTONIX 40 MG DAILY, RX SENT TO PHARMACY  DECREASE YOUR ASPIRIN TO 81 MG DAILY  Your Albuterol has been refilled   Your physician wants you to follow-up in: 6 months with fasting labs (lp/bmet/hfp)  You will receive a reminder letter in the mail two months in advance. If you don't receive a letter, please call our office to schedule the follow-up appointment.

## 2014-06-29 NOTE — Assessment & Plan Note (Signed)
The patient is having more dyspepsia and acid reflux.  We will stop his omeprazole and try him on per 40 mg daily.  He will also reduce his aspirin to just 81 mg daily

## 2014-06-29 NOTE — Progress Notes (Signed)
[name] Date of Birth:  Sep 13, 1934 [facility] [address] [place], [place]  [address] [phone]        Fax   [phone]   History of Present Illness: This pleasant 79 year old gentleman is seen for a followup visit. He has a history of atypical chest pain. He has had several prior cardiac catheterizations which have been normal. He was found to have chronic cholecystitis subsequently and underwent laparoscopic cholecystectomy in January 2011 and had a subsequent ERCP for removal of common duct stone. Since then he has been feeling well. At his last visit he had a skin rash which she had attributed to taking Mobic. However he saw his dermatologist who diagnosed a fungal infection and treated at with good resolution. The rash was not from Mobic.  Since last visit he has had successful right total knee replacement by Dr. Durward Fortes.  He has good range of motion now. 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 Ascorbic Acid (VITAMIN C) 500 MG tablet Take 1 tablet (500 mg total) by mouth daily.  30 tablet    . aspirin 81 MG tablet Take 81 mg by mouth daily.      . metoprolol tartrate (LOPRESSOR) 25 MG tablet TAKE 1 TABLET ONCE DAILY.  30 tablet  3  . potassium chloride SA (K-DUR,KLOR-CON) 20 MEQ tablet TAKE 1 TABLET ONCE DAILY.  30 tablet  3  . albuterol (PROVENTIL HFA;VENTOLIN HFA) 108 (90 BASE) MCG/ACT inhaler Inhale 2 puffs into the lungs every 6 (six) hours as needed for wheezing or shortness of breath.  1 Inhaler  3  . HYDROmorphone (DILAUDID) 2 MG tablet Take 1 tablet (2 mg total) by mouth every 4 (four) hours as needed for severe pain (1 - 2 TABLETS Q 4H PRN PAIN).  90 tablet  0  . methocarbamol (ROBAXIN) 500 MG tablet Take 1 tablet (500 mg total) by mouth every 8 (eight)  hours as needed for muscle spasms.  30 tablet  0  . pantoprazole (PROTONIX) 40 MG tablet Take 1 tablet (40 mg total) by mouth daily.  30 tablet  11  . rivaroxaban (XARELTO) 10 MG TABS tablet Take 1 tablet (10 mg total) by mouth daily.  12 tablet  0  . tamsulosin (FLOMAX) 0.4 MG CAPS capsule Take 1 capsule (0.4 mg total) by mouth daily after breakfast.  30 capsule  0   No current facility-administered medications for this visit.    Allergies  Allergen Reactions  . Tape Other (See Comments)    Adhesive tape blisters  . Erythromycin Rash and Other (See Comments)    unknown  . Sulfa Antibiotics Rash  . Tetracyclines & Related Rash    Patient Active Problem List   Diagnosis Date Noted  . Benign hypertensive heart disease without heart failure 12/13/2010    Priority: Medium  . Dyslipidemia 12/13/2010    Priority: Medium  . Status post cholecystectomy 12/13/2010    Priority: Medium  . Bronchial asthma 06/29/2014  . Urinary retention due to benign prostatic hyperplasia 04/03/2014  . Ileus, postoperative 04/03/2014  . Osteoarthritis of right knee 04/01/2014  . S/P total knee replacement using cement 03/31/2014  . Skin rash 04/11/2013  . Pseudogout of knee 12/12/2012  . Dyspepsia 08/14/2012  . Trochanteric Bursitis of right hip 07/18/2011  .  BPH (benign prostatic hyperplasia) 12/13/2010  . Osteoarthritis 12/13/2010    History  Smoking status  . Never Smoker   Smokeless tobacco  . Not on file    History  Alcohol Use No    Family History  Problem Relation Age of Onset  . Heart disease Mother   . Heart attack Mother   . Anesthesia problems Neg Hx   . Hypotension Neg Hx   . Malignant hyperthermia Neg Hx   . Pseudochol deficiency Neg Hx     Review of Systems: Constitutional: no fever chills diaphoresis or fatigue or change in weight.  Head and neck: no hearing loss, no epistaxis, no photophobia or visual disturbance. Respiratory: No cough, shortness of breath or  wheezing. Cardiovascular: No chest pain peripheral edema, palpitations. Gastrointestinal: No abdominal distention, no abdominal pain, no change in bowel habits hematochezia or melena. Genitourinary: No dysuria, no frequency, no urgency, no nocturia. Musculoskeletal:No arthralgias, no back pain, no gait disturbance or myalgias. Neurological: No dizziness, no headaches, no numbness, no seizures, no syncope, no weakness, no tremors. Hematologic: No lymphadenopathy, no easy bruising. Psychiatric: No confusion, no hallucinations, no sleep disturbance.    Physical Exam: Filed Vitals:   06/29/14 1201  BP: 116/80  Pulse: 57  The patient appears to be in no distress.  Head and neck exam reveals that the pupils are equal and reactive.  The extraocular movements are full.  There is no scleral icterus.  Mouth and pharynx are benign.  No lymphadenopathy.  No carotid bruits.  The jugular venous pressure is normal.  Thyroid is not enlarged or tender.  Chest is clear to percussion and auscultation.  No rales or rhonchi.  Expansion of the chest is symmetrical.  Heart reveals no abnormal lift or heave.  First and second heart sounds are normal.  There is no murmur gallop rub or click.  The abdomen is soft and nontender.  Bowel sounds are normoactive.  There is no hepatosplenomegaly or mass.  There are no abdominal bruits.  Extremities reveal no phlebitis or edema.  Pedal pulses are good.  There is no cyanosis or clubbing.  Neurologic exam is normal strength and no lateralizing weakness.  No sensory deficits.  Integument reveals no rash  EKG shows sinus bradycardia with sinus arrhythmia and incomplete right bundle branch block  Assessment / Plan: 1. essential hypertension without heart failure 2.  Hyperlipidemia 3. osteoarthritis status post successful right total knee replacement by Dr. Durward Fortes 4. GERD and dyspepsia 5. status post cholecystectomy 6. history of bronchial asthma which flares up  in the winter and is responsive to albuterol inhaler.  Disposition: Continue same medication except reduce dose of aspirin to just 81 mg daily and switch from omeprazole to Protonix.

## 2014-06-29 NOTE — Assessment & Plan Note (Signed)
The patient has not been having any exertional chest pain or dyspnea.  No dizziness or syncope.

## 2014-06-29 NOTE — Assessment & Plan Note (Signed)
Patient has a history of dyslipidemia.  We reviewed his labs which are satisfactory.  He is not on any statin therapy

## 2014-08-18 ENCOUNTER — Other Ambulatory Visit: Payer: Self-pay | Admitting: Cardiology

## 2014-09-18 DIAGNOSIS — N401 Enlarged prostate with lower urinary tract symptoms: Secondary | ICD-10-CM | POA: Diagnosis not present

## 2014-11-30 DIAGNOSIS — M1711 Unilateral primary osteoarthritis, right knee: Secondary | ICD-10-CM | POA: Diagnosis not present

## 2014-11-30 DIAGNOSIS — M1712 Unilateral primary osteoarthritis, left knee: Secondary | ICD-10-CM | POA: Diagnosis not present

## 2014-12-21 ENCOUNTER — Other Ambulatory Visit: Payer: Self-pay | Admitting: Cardiology

## 2015-01-08 DIAGNOSIS — R202 Paresthesia of skin: Secondary | ICD-10-CM | POA: Diagnosis not present

## 2015-01-15 DIAGNOSIS — R202 Paresthesia of skin: Secondary | ICD-10-CM | POA: Diagnosis not present

## 2015-01-15 DIAGNOSIS — M1711 Unilateral primary osteoarthritis, right knee: Secondary | ICD-10-CM | POA: Diagnosis not present

## 2015-01-25 ENCOUNTER — Other Ambulatory Visit: Payer: Self-pay | Admitting: Cardiology

## 2015-01-25 DIAGNOSIS — M5416 Radiculopathy, lumbar region: Secondary | ICD-10-CM | POA: Diagnosis not present

## 2015-01-27 ENCOUNTER — Ambulatory Visit (INDEPENDENT_AMBULATORY_CARE_PROVIDER_SITE_OTHER): Payer: Medicare Other | Admitting: Cardiology

## 2015-01-27 ENCOUNTER — Encounter: Payer: Self-pay | Admitting: Cardiology

## 2015-01-27 VITALS — BP 118/72 | HR 67 | Ht 76.0 in | Wt 240.0 lb

## 2015-01-27 DIAGNOSIS — E785 Hyperlipidemia, unspecified: Secondary | ICD-10-CM | POA: Diagnosis not present

## 2015-01-27 DIAGNOSIS — I119 Hypertensive heart disease without heart failure: Secondary | ICD-10-CM | POA: Diagnosis not present

## 2015-01-27 DIAGNOSIS — R1013 Epigastric pain: Secondary | ICD-10-CM

## 2015-01-27 LAB — BASIC METABOLIC PANEL
BUN: 21 mg/dL (ref 6–23)
CO2: 26 mEq/L (ref 19–32)
Calcium: 9 mg/dL (ref 8.4–10.5)
Chloride: 107 mEq/L (ref 96–112)
Creatinine, Ser: 0.87 mg/dL (ref 0.40–1.50)
GFR: 90.28 mL/min (ref >60.00)
GLUCOSE: 108 mg/dL — AB (ref 70–99)
Potassium: 3.9 mEq/L (ref 3.5–5.1)
SODIUM: 140 meq/L (ref 135–145)

## 2015-01-27 LAB — LIPID PANEL
Cholesterol: 154 mg/dL (ref 0–200)
HDL: 51.4 mg/dL (ref >39.00)
LDL Cholesterol: 78 mg/dL (ref 0–99)
NONHDL: 102.6
Total CHOL/HDL Ratio: 3
Triglycerides: 121 mg/dL (ref 0.0–149.0)
VLDL: 24.2 mg/dL (ref 0.0–40.0)

## 2015-01-27 LAB — HEPATIC FUNCTION PANEL
ALK PHOS: 57 U/L (ref 39–117)
ALT: 18 U/L (ref 0–53)
AST: 20 U/L (ref 0–37)
Albumin: 4.1 g/dL (ref 3.5–5.2)
Bilirubin, Direct: 0.2 mg/dL (ref 0.0–0.3)
Total Bilirubin: 0.9 mg/dL (ref 0.2–1.2)
Total Protein: 6.8 g/dL (ref 6.0–8.3)

## 2015-01-27 MED ORDER — PANTOPRAZOLE SODIUM 40 MG PO TBEC: 40.0000 mg | DELAYED_RELEASE_TABLET | Freq: Every day | ORAL | Status: DC

## 2015-01-27 MED ORDER — POTASSIUM CHLORIDE CRYS ER 20 MEQ PO TBCR: 20.0000 meq | EXTENDED_RELEASE_TABLET | Freq: Every day | ORAL | Status: DC

## 2015-01-27 MED ORDER — METOPROLOL TARTRATE 25 MG PO TABS: 25.0000 mg | ORAL_TABLET | Freq: Every day | ORAL | Status: DC

## 2015-01-27 NOTE — Progress Notes (Signed)
Cardiology Office Note   Date:  01/27/2015   ID:  [name], DOB [birth year], MRN [id number]  PCP:  Mayra Neer, MD  Cardiologist: Darlin Coco MD  No chief complaint on file.     History of Present Illness: [name] is a 80 y.o. male who presents for a six-month follow-up office visit  This pleasant 80 year old gentleman is seen for a followup visit. He has a history of atypical chest pain. He has had several prior cardiac catheterizations which have been normal. He was found to have chronic cholecystitis subsequently and underwent laparoscopic cholecystectomy in January 2011 and had a subsequent ERCP for removal of common duct stone. Since then he has been feeling well. He has not been having any chest pain or shortness of breath.  His blood pressure at home has been good. In July 2015 the patient had successful right total knee replacement by Dr. Durward Fortes. He has good range of motion now.  He also has arthritis of his left knee but it is not severe enough to require surgery at this point. The patient has been having symptoms of acid reflux.  These symptoms have improved since we switched him to protonix. 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 The patient recently returned from a 4 day golf outing at [facility].  His weight is up 12 pounds since we last saw him.  Past Medical History  Diagnosis Date  . Asthma     only if develops cold  . Recurrent upper respiratory infection (URI) 03/2011  . Kidney stone on left side   . Arthritis   . PVC (premature ventricular contraction)     Past Surgical History  Procedure Laterality Date  . Cardiac catheterization      july 2010-minor irregl  . Kidney stone surgery  2011    R  . Back surgery      x 4  . Total hip arthroplasty      Right  . Total hip revision      Right  . Shoulder arthroscopy      Left  . Wrist surgery      bilateral, plates    . Knee arthroscopy      Left  . Appendectomy    . Eye surgery      bilat cataract  . Incision and drainage hip  07/18/2011    Procedure: IRRIGATION AND DEBRIDEMENT HIP;  Surgeon: Garald Balding, MD;  Location: [facility];  Service: Orthopedics;  Laterality: Right;  RIGHT HIP EXPLORATION, EXCISION OF DEEP BURSAL SAC  . Cholecystectomy      2008  . Cervical spine surgery      x 2  . Total knee arthroplasty Right 03/31/2014    Procedure: TOTAL KNEE ARTHROPLASTY;  Surgeon: Garald Balding, MD;  Location: [facility];  Service: Orthopedics;  Laterality: Right;     Current Outpatient Prescriptions  Medication Sig Dispense Refill  . albuterol (PROVENTIL HFA;VENTOLIN HFA) 108 (90 BASE) MCG/ACT inhaler Inhale 2 puffs into the lungs every 6 (six) hours as needed for wheezing or shortness of breath. 1 Inhaler 3  . Ascorbic Acid (VITAMIN C) 500 MG tablet Take 1 tablet (500 mg total) by mouth daily. 30 tablet   . aspirin 81 MG tablet Take 81 mg by mouth daily.    Marland Kitchen HYDROmorphone (DILAUDID) 2 MG tablet Take 1 tablet (2 mg total) by mouth every 4 (four) hours as needed for severe  pain (1 - 2 TABLETS Q 4H PRN PAIN). 90 tablet 0  . methocarbamol (ROBAXIN) 500 MG tablet Take 1 tablet (500 mg total) by mouth every 8 (eight) hours as needed for muscle spasms. 30 tablet 0  . metoprolol tartrate (LOPRESSOR) 25 MG tablet Take 1 tablet (25 mg total) by mouth daily. 90 tablet 3  . pantoprazole (PROTONIX) 40 MG tablet Take 1 tablet (40 mg total) by mouth daily. 90 tablet 3  . potassium chloride SA (K-DUR,KLOR-CON) 20 MEQ tablet Take 1 tablet (20 mEq total) by mouth daily. 90 tablet 3  . rivaroxaban (XARELTO) 10 MG TABS tablet Take 1 tablet (10 mg total) by mouth daily. 12 tablet 0  . tamsulosin (FLOMAX) 0.4 MG CAPS capsule Take 1 capsule (0.4 mg total) by mouth daily after breakfast. 30 capsule 0   No current facility-administered medications for this visit.    Allergies:   Tape; Erythromycin; Sulfa antibiotics;  and Tetracyclines & related    Social History:  The patient  reports that he has never smoked. He does not have any smokeless tobacco history on file. He reports that he does not drink alcohol or use illicit drugs.   Family History:  The patient's family history includes Heart attack in his mother; Heart disease in his mother. There is no history of Anesthesia problems, Hypotension, Malignant hyperthermia, or Pseudochol deficiency.    ROS:  Please see the history of present illness.   Otherwise, review of systems are positive for none.   All other systems are reviewed and negative.    PHYSICAL EXAM: VS:  BP 118/72 mmHg  Pulse 67  Ht 6\' 4"  (1.93 m)  Wt 240 lb (108.863 kg)  BMI 29.23 kg/m2 , BMI Body mass index is 29.23 kg/(m^2). GEN: Well nourished, well developed, in no acute distress HEENT: normal Neck: no JVD, carotid bruits, or masses Cardiac: RRR; no murmurs, rubs, or gallops,no edema  Respiratory:  clear to auscultation bilaterally, normal work of breathing GI: soft, nontender, nondistended, + BS MS: no deformity or atrophy Skin: warm and dry, no rash Neuro:  Strength and sensation are intact Psych: euthymic mood, full affect   EKG:  EKG is not ordered today.    Recent Labs: 04/03/2014: Hemoglobin 10.4*; Platelets 95* 06/26/2014: ALT 16; BUN 14; Creatinine 0.9; Potassium 4.0; Sodium 140    Lipid Panel    Component Value Date/Time   CHOL 164 06/26/2014 0858   TRIG 116.0 06/26/2014 0858   HDL 43.80 06/26/2014 0858   CHOLHDL 4 06/26/2014 0858   VLDL 23.2 06/26/2014 0858   LDLCALC 97 06/26/2014 0858      Wt Readings from Last 3 Encounters:  01/27/15 240 lb (108.863 kg)  06/29/14 228 lb (103.42 kg)  03/31/14 230 lb (104.327 kg)         ASSESSMENT AND PLAN:  1. essential hypertension without heart failure 2. Hyperlipidemia with weight gain 3. osteoarthritis status post successful right total knee replacement by Dr. Durward Fortes 4. GERD and dyspepsia 5.  status post cholecystectomy 6. history of bronchial asthma which flares up in the winter and is responsive to albuterol inhaler.  Disposition: Continue same medication.  Work harder on weight loss.  Recheck in 6 months for office visit lipid panel hepatic function panel and basal metabolic panel   Current medicines are reviewed at length with the patient today.  The patient does not have concerns regarding medicines.  The following changes have been made:  no change  Labs/ tests ordered today  include:   Orders Placed This Encounter  Procedures  . Lipid panel  . Hepatic function panel  . Basic metabolic panel       Signed, Darlin Coco MD 01/27/2015 11:25 AM    [facility] [address], [place], [place]  [address] Phone: [phone]; Fax: [phone]

## 2015-01-27 NOTE — Progress Notes (Signed)
Quick Note:  Please report to patient. The recent labs are stable. Continue same medication and careful diet. ______ 

## 2015-01-27 NOTE — Patient Instructions (Signed)
Medication Instructions:  Your physician recommends that you continue on your current medications as directed. Please refer to the Current Medication list given to you today.  Labwork: LP/BMET/HFP  Testing/Procedures: NONE  Follow-Up: Your physician wants you to follow-up in: 6 months with fasting labs (lp/bmet/hfp) You will receive a reminder letter in the mail two months in advance. If you don't receive a letter, please call our office to schedule the follow-up appointment.  Any Other Special Instructions Will Be Listed Below (If Applicable). WORK HARDER ON DIET AND WEIGHT LOSS

## 2015-03-01 ENCOUNTER — Other Ambulatory Visit: Payer: Self-pay

## 2015-03-04 IMAGING — CR DG ABDOMEN 1V
1 series · 1 of 1 positions shown · non-contrast
Comparison: None.

CLINICAL DATA: Abdominal distention status post knee replacement 2
days ago

EXAM:
ABDOMEN - 1 VIEW

[t abdomen supine]
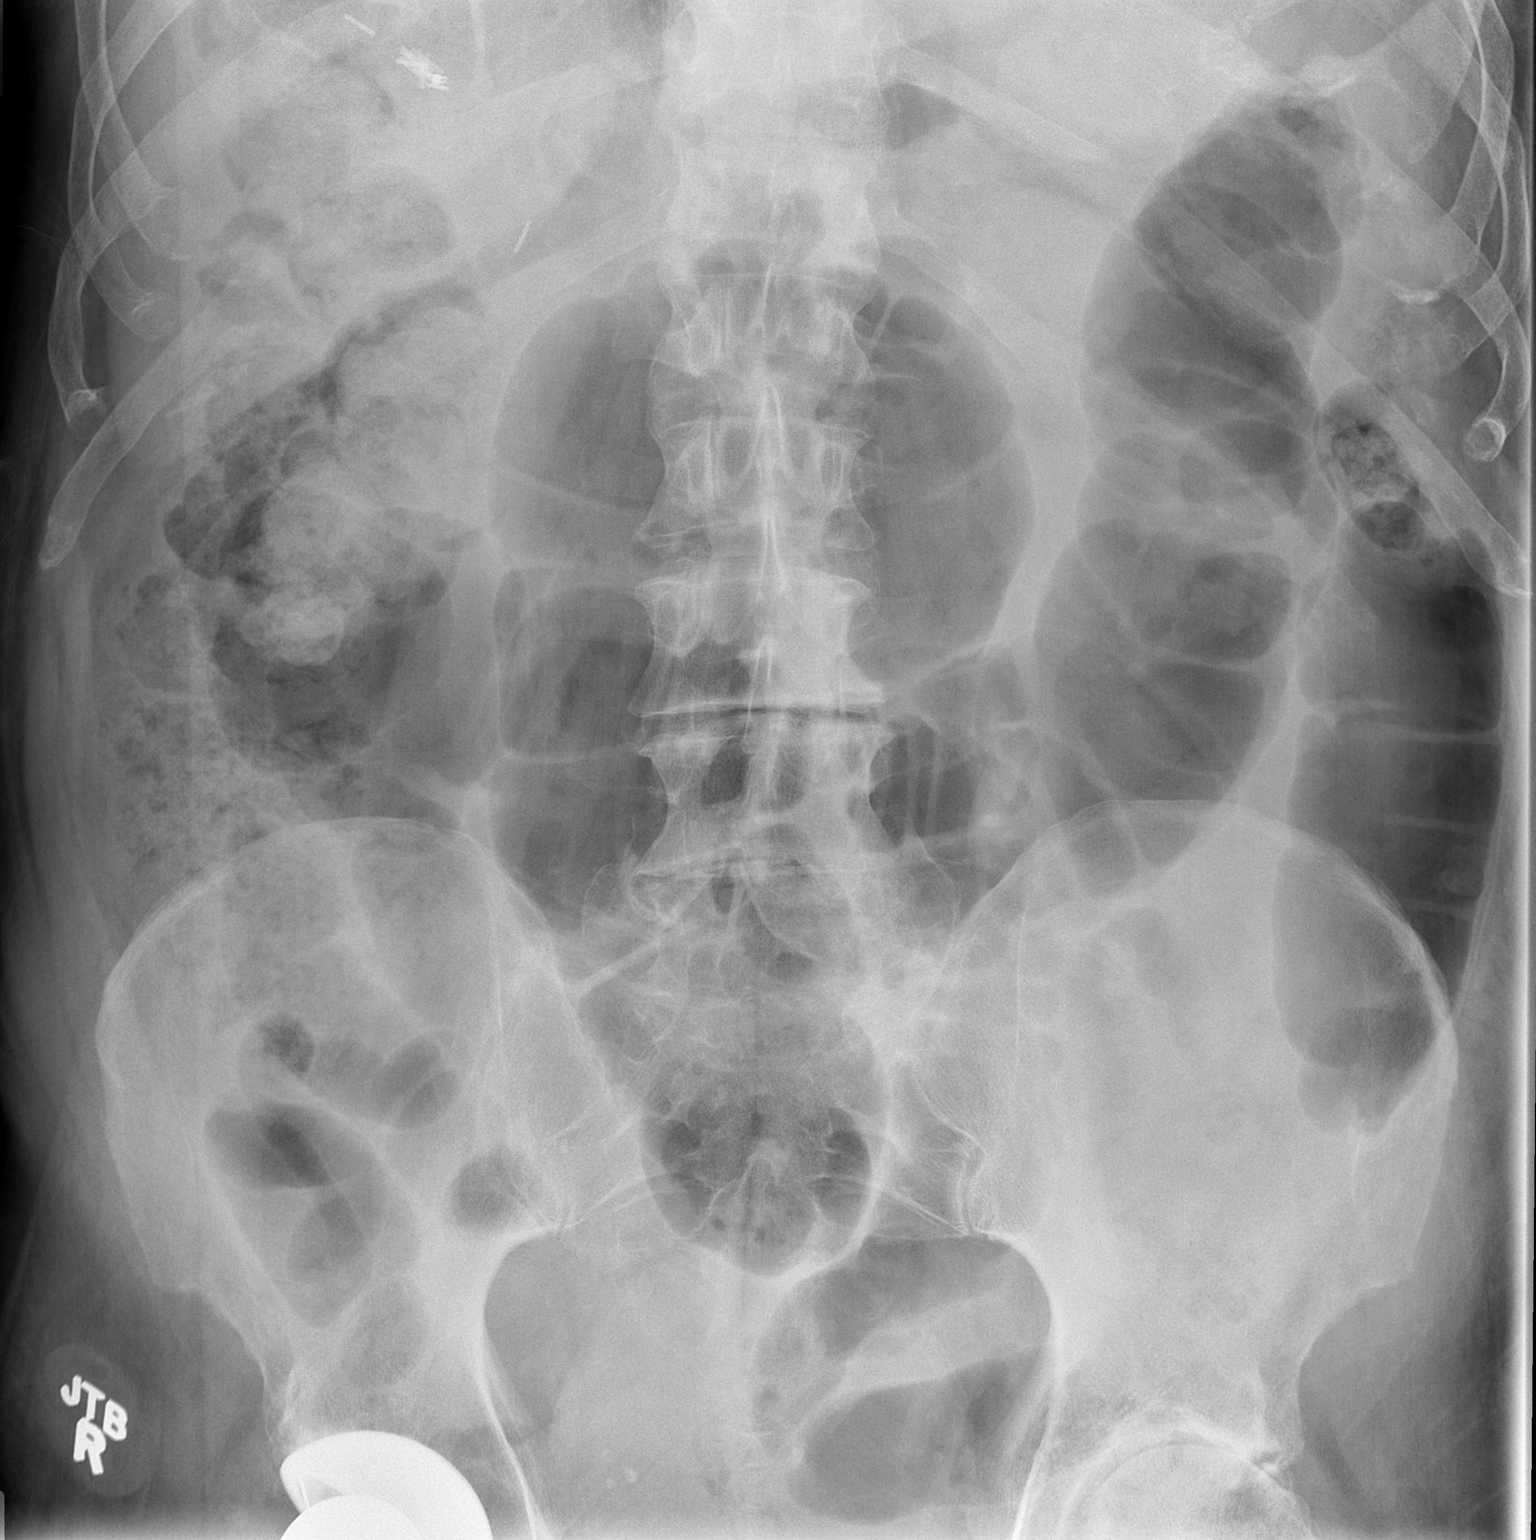

[1 of 1 positions shown; findings below may reference images not displayed]

FINDINGS: There is gaseous distention of the colon. There is a moderate amount
of stool in the ascending colon. There is no small bowel dilatation
to suggest obstruction. There is no evidence of pneumoperitoneum,
portal venous gas or pneumatosis. There are no pathologic
calcifications along the expected course of the ureters.

There is a right total hip arthroplasty. There is severe
osteoarthritis of the left hip.
IMPRESSION: Gaseous distention of the colon as can be seen with an ileus given
of the patient is status post surgery.

## 2015-03-11 DIAGNOSIS — H6121 Impacted cerumen, right ear: Secondary | ICD-10-CM | POA: Diagnosis not present

## 2015-03-11 DIAGNOSIS — Z0000 Encounter for general adult medical examination without abnormal findings: Secondary | ICD-10-CM | POA: Diagnosis not present

## 2015-03-11 DIAGNOSIS — R002 Palpitations: Secondary | ICD-10-CM | POA: Diagnosis not present

## 2015-03-11 DIAGNOSIS — Z23 Encounter for immunization: Secondary | ICD-10-CM | POA: Diagnosis not present

## 2015-03-11 DIAGNOSIS — K219 Gastro-esophageal reflux disease without esophagitis: Secondary | ICD-10-CM | POA: Diagnosis not present

## 2015-05-17 DIAGNOSIS — M1712 Unilateral primary osteoarthritis, left knee: Secondary | ICD-10-CM | POA: Diagnosis not present

## 2015-06-15 ENCOUNTER — Encounter: Payer: Self-pay | Admitting: Cardiology

## 2015-07-30 ENCOUNTER — Other Ambulatory Visit: Payer: PRIVATE HEALTH INSURANCE

## 2015-07-30 ENCOUNTER — Ambulatory Visit: Payer: PRIVATE HEALTH INSURANCE | Admitting: Cardiology

## 2015-08-02 ENCOUNTER — Ambulatory Visit: Payer: PRIVATE HEALTH INSURANCE | Admitting: Cardiology

## 2015-09-23 ENCOUNTER — Other Ambulatory Visit: Payer: Medicare Other

## 2015-09-23 ENCOUNTER — Ambulatory Visit: Payer: PRIVATE HEALTH INSURANCE | Admitting: Cardiology

## 2015-10-15 ENCOUNTER — Other Ambulatory Visit (INDEPENDENT_AMBULATORY_CARE_PROVIDER_SITE_OTHER): Payer: Medicare Other | Admitting: *Deleted

## 2015-10-15 ENCOUNTER — Ambulatory Visit (INDEPENDENT_AMBULATORY_CARE_PROVIDER_SITE_OTHER): Payer: Medicare Other | Admitting: Cardiology

## 2015-10-15 ENCOUNTER — Encounter: Payer: Self-pay | Admitting: Cardiology

## 2015-10-15 VITALS — BP 120/70 | HR 58 | Ht 76.0 in | Wt 234.1 lb

## 2015-10-15 DIAGNOSIS — I119 Hypertensive heart disease without heart failure: Secondary | ICD-10-CM

## 2015-10-15 DIAGNOSIS — M159 Polyosteoarthritis, unspecified: Secondary | ICD-10-CM

## 2015-10-15 DIAGNOSIS — R1013 Epigastric pain: Secondary | ICD-10-CM | POA: Diagnosis not present

## 2015-10-15 DIAGNOSIS — M150 Primary generalized (osteo)arthritis: Secondary | ICD-10-CM | POA: Diagnosis not present

## 2015-10-15 DIAGNOSIS — E785 Hyperlipidemia, unspecified: Secondary | ICD-10-CM | POA: Diagnosis not present

## 2015-10-15 LAB — HEPATIC FUNCTION PANEL
ALT: 19 U/L (ref 9–46)
AST: 24 U/L (ref 10–35)
Albumin: 4.1 g/dL (ref 3.6–5.1)
Alkaline Phosphatase: 44 U/L (ref 40–115)
BILIRUBIN INDIRECT: 1.5 mg/dL — AB (ref 0.2–1.2)
BILIRUBIN TOTAL: 1.9 mg/dL — AB (ref 0.2–1.2)
Bilirubin, Direct: 0.4 mg/dL — ABNORMAL HIGH (ref <=0.2)
Total Protein: 6.9 g/dL (ref 6.1–8.1)

## 2015-10-15 LAB — BASIC METABOLIC PANEL
BUN: 17 mg/dL (ref 7–25)
CALCIUM: 9.1 mg/dL (ref 8.6–10.3)
CO2: 27 mmol/L (ref 20–31)
Chloride: 103 mmol/L (ref 98–110)
Creat: 0.87 mg/dL (ref 0.70–1.18)
GLUCOSE: 111 mg/dL — AB (ref 65–99)
Potassium: 4.2 mmol/L (ref 3.5–5.3)
Sodium: 140 mmol/L (ref 135–146)

## 2015-10-15 LAB — LIPID PANEL
CHOL/HDL RATIO: 3.5 ratio (ref <=5.0)
Cholesterol: 164 mg/dL (ref 125–200)
HDL: 47 mg/dL (ref >=40)
LDL CALC: 94 mg/dL (ref <130)
Triglycerides: 115 mg/dL (ref <150)
VLDL: 23 mg/dL (ref <30)

## 2015-10-15 NOTE — Addendum Note (Signed)
Addended by: Eulis Foster on: 10/15/2015 09:50 AM   Modules accepted: Orders

## 2015-10-15 NOTE — Addendum Note (Signed)
Addended by: Eulis Foster on: 10/15/2015 09:49 AM   Modules accepted: Orders

## 2015-10-15 NOTE — Progress Notes (Signed)
Cardiology Office Note   Date:  10/15/2015   ID:  [name], DOB 11-05-1934, MRN [id number]  PCP:  Donnie Coffin, MD  Cardiologist: Darlin Coco MD  No chief complaint on file.     History of Present Illness: [name] is a 81 y.o. male who presents for Six-month follow-up office visit  This pleasant 81 year old gentleman is seen for a followup visit. He has a history of atypical chest pain. He has had several prior cardiac catheterizations which have been normal. He was found to have chronic cholecystitis subsequently and underwent laparoscopic cholecystectomy in January 2011 and had a subsequent ERCP for removal of common duct stone. Since then he has been feeling well. He has not been having any chest pain or shortness of breath. His blood pressure at home has been good.Since we last saw him he has had several episodes where he felt that his heart was skipping or stopping momentarily.  These occurred several months ago.  Nothing recent.  They were not associate with any chest pain or dizziness or presyncope. In July 2015 the patient had successful right total knee replacement by Dr. Durward Fortes. He has good range of motion now. He also has arthritis of his left knee but it is not severe enough to require surgery at this point. The patient has been having symptoms of acid reflux. These symptoms have improved since we switched him to protonix. 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 Since last visit the patient has been more careful with his diet.  His weight is down 6 pounds.  Has had some occasional fatty food intolerance to fried foods.  This is a residual effect of his previous cholecystectomy. He has been getting exercise by playing golf twice a week.  In December he shot his age for the first time.  Past Medical History  Diagnosis Date  . Asthma     only if develops cold  . Recurrent upper respiratory  infection (URI) 03/2011  . Kidney stone on left side   . Arthritis   . PVC (premature ventricular contraction)     Past Surgical History  Procedure Laterality Date  . Cardiac catheterization      july 2010-minor irregl  . Kidney stone surgery  2011    R  . Back surgery      x 4  . Total hip arthroplasty      Right  . Total hip revision      Right  . Shoulder arthroscopy      Left  . Wrist surgery      bilateral, plates  . Knee arthroscopy      Left  . Appendectomy    . Eye surgery      bilat cataract  . Incision and drainage hip  07/18/2011    Procedure: IRRIGATION AND DEBRIDEMENT HIP;  Surgeon: Garald Balding, MD;  Location: [facility];  Service: Orthopedics;  Laterality: Right;  RIGHT HIP EXPLORATION, EXCISION OF DEEP BURSAL SAC  . Cholecystectomy      2008  . Cervical spine surgery      x 2  . Total knee arthroplasty Right 03/31/2014    Procedure: TOTAL KNEE ARTHROPLASTY;  Surgeon: Garald Balding, MD;  Location: [facility];  Service: Orthopedics;  Laterality: Right;     Current Outpatient Prescriptions  Medication Sig Dispense Refill  . albuterol (PROVENTIL HFA;VENTOLIN HFA) 108 (90 BASE) MCG/ACT inhaler Inhale 2 puffs into  the lungs every 6 (six) hours as needed for wheezing or shortness of breath. 1 Inhaler 3  . Ascorbic Acid (VITAMIN C) 500 MG tablet Take 1 tablet (500 mg total) by mouth daily. 30 tablet   . aspirin 81 MG tablet Take 81 mg by mouth daily.    . metoprolol tartrate (LOPRESSOR) 25 MG tablet Take 1 tablet (25 mg total) by mouth daily. 90 tablet 3  . pantoprazole (PROTONIX) 40 MG tablet Take 1 tablet (40 mg total) by mouth daily. 90 tablet 3  . potassium chloride SA (K-DUR,KLOR-CON) 20 MEQ tablet Take 1 tablet (20 mEq total) by mouth daily. 90 tablet 3   No current facility-administered medications for this visit.    Allergies:   Tape; Erythromycin; Sulfa antibiotics; and Tetracyclines & related    Social History:  The patient  reports that he has  never smoked. He has never used smokeless tobacco. He reports that he does not drink alcohol or use illicit drugs.   Family History:  The patient's family history includes Heart attack in his mother; Heart disease in his mother. There is no history of Anesthesia problems, Hypotension, Malignant hyperthermia, or Pseudochol deficiency.    ROS:  Please see the history of present illness.   Otherwise, review of systems are positive for none.   All other systems are reviewed and negative.    PHYSICAL EXAM: VS:  BP 120/70 mmHg  Pulse 58  Ht 6\' 4"  (1.93 m)  Wt 234 lb 1.9 oz (106.196 kg)  BMI 28.51 kg/m2 , BMI Body mass index is 28.51 kg/(m^2). GEN: Well nourished, well developed, in no acute distress HEENT: normal Neck: no JVD, carotid bruits, or masses Cardiac: RRR; no murmurs, rubs, or gallops,no edema  Respiratory:  clear to auscultation bilaterally, normal work of breathing GI: soft, nontender, nondistended, + BS MS: no deformity or atrophy Skin: warm and dry, no rash Neuro:  Strength and sensation are intact Psych: euthymic mood, full affect   EKG:  EKG is ordered today. The ekg ordered today demonstrates Sinus bradycardia at 58 bpm.  Incomplete right bundle branch block.  Since prior tracing of 06/29/14, no significant change   Recent Labs: 01/27/2015: ALT 18; BUN 21; Creatinine, Ser 0.87; Potassium 3.9; Sodium 140    Lipid Panel    Component Value Date/Time   CHOL 154 01/27/2015 1122   TRIG 121.0 01/27/2015 1122   HDL 51.40 01/27/2015 1122   CHOLHDL 3 01/27/2015 1122   VLDL 24.2 01/27/2015 1122   LDLCALC 78 01/27/2015 1122      Wt Readings from Last 3 Encounters:  10/15/15 234 lb 1.9 oz (106.196 kg)  01/27/15 240 lb (108.863 kg)  06/29/14 228 lb (103.42 kg)        ASSESSMENT AND PLAN:  1. essential hypertension without heart failure.Blood pressure currently adequately controlled.  Continue current medication. 2. Hyperlipidemia.Blood work drawn earlier today  is pending 3. osteoarthritis status post successful right total knee replacement by Dr. Durward Fortes 4. GERD and dyspepsia 5. status post cholecystectomy 6. history of bronchial asthma which flares up in the winter and is responsive to albuterol inhaler.    Current medicines are reviewed at length with the patient today.  The patient does not have concerns regarding medicines.  The following changes have been made:  no change  Labs/ tests ordered today include:  No orders of the defined types were placed in this encounter.     Disposition:   Continue current medication.  Continue with prudent  diet and weight loss.He will return in 6 months to see Dr. Debara Pickett who will also be seeing his wife.  Berna Spare MD 10/15/2015 2:16 PM    [facility] [place], [place], [place]  [address] Phone: [phone]; Fax: [phone]

## 2015-10-15 NOTE — Patient Instructions (Signed)
Medication Instructions:  Your physician recommends that you continue on your current medications as directed. Please refer to the Current Medication list given to you today.  Labwork: none  Testing/Procedures: none  Follow-Up: Your physician wants you to follow-up in: 6 month ov with Dr Debara Pickett at the [place] office You will receive a reminder letter in the mail two months in advance. If you don't receive a letter, please call our office to schedule the follow-up appointment.  If you need a refill on your cardiac medications before your next appointment, please call your pharmacy.

## 2015-10-16 NOTE — Progress Notes (Signed)
Quick Note:  Please report to patient. The recent labs are stable. Continue same medication and careful diet. ______ 

## 2015-11-16 DIAGNOSIS — M7701 Medial epicondylitis, right elbow: Secondary | ICD-10-CM | POA: Diagnosis not present

## 2015-11-16 DIAGNOSIS — G5621 Lesion of ulnar nerve, right upper limb: Secondary | ICD-10-CM | POA: Diagnosis not present

## 2015-11-22 DIAGNOSIS — G5621 Lesion of ulnar nerve, right upper limb: Secondary | ICD-10-CM | POA: Diagnosis not present

## 2015-11-29 DIAGNOSIS — G5621 Lesion of ulnar nerve, right upper limb: Secondary | ICD-10-CM | POA: Diagnosis not present

## 2016-02-08 ENCOUNTER — Encounter: Payer: Self-pay | Admitting: Internal Medicine

## 2016-02-08 ENCOUNTER — Telehealth: Payer: Self-pay | Admitting: Internal Medicine

## 2016-02-11 NOTE — Telephone Encounter (Signed)
Close encounter 

## 2016-02-25 ENCOUNTER — Other Ambulatory Visit: Payer: Self-pay

## 2016-02-25 MED ORDER — PANTOPRAZOLE SODIUM 40 MG PO TBEC: 40.0000 mg | DELAYED_RELEASE_TABLET | Freq: Every day | ORAL | Status: DC

## 2016-02-25 MED ORDER — POTASSIUM CHLORIDE CRYS ER 20 MEQ PO TBCR: 20.0000 meq | EXTENDED_RELEASE_TABLET | Freq: Every day | ORAL | Status: DC

## 2016-02-25 MED ORDER — METOPROLOL TARTRATE 25 MG PO TABS: 25.0000 mg | ORAL_TABLET | Freq: Every day | ORAL | Status: DC

## 2016-03-15 DIAGNOSIS — M1712 Unilateral primary osteoarthritis, left knee: Secondary | ICD-10-CM | POA: Diagnosis not present

## 2016-03-15 DIAGNOSIS — M25462 Effusion, left knee: Secondary | ICD-10-CM | POA: Diagnosis not present

## 2016-03-29 DIAGNOSIS — Z0000 Encounter for general adult medical examination without abnormal findings: Secondary | ICD-10-CM | POA: Diagnosis not present

## 2016-03-29 DIAGNOSIS — K219 Gastro-esophageal reflux disease without esophagitis: Secondary | ICD-10-CM | POA: Diagnosis not present

## 2016-03-29 DIAGNOSIS — Z23 Encounter for immunization: Secondary | ICD-10-CM | POA: Diagnosis not present

## 2016-04-12 ENCOUNTER — Encounter: Payer: Self-pay | Admitting: Internal Medicine

## 2016-04-12 ENCOUNTER — Ambulatory Visit (INDEPENDENT_AMBULATORY_CARE_PROVIDER_SITE_OTHER): Payer: Medicare Other | Admitting: Internal Medicine

## 2016-04-12 VITALS — BP 120/78 | HR 68 | Ht 76.0 in | Wt 236.6 lb

## 2016-04-12 DIAGNOSIS — I452 Bifascicular block: Secondary | ICD-10-CM | POA: Insufficient documentation

## 2016-04-12 DIAGNOSIS — I493 Ventricular premature depolarization: Secondary | ICD-10-CM

## 2016-04-12 DIAGNOSIS — E785 Hyperlipidemia, unspecified: Secondary | ICD-10-CM

## 2016-04-12 DIAGNOSIS — R0789 Other chest pain: Secondary | ICD-10-CM | POA: Diagnosis not present

## 2016-04-12 NOTE — Progress Notes (Signed)
OFFICE NOTE  Chief Complaint:  Establish cardiologist  Primary Care Physician: Donnie Coffin, MD  HPI:  [name] is a 81 y.o. male who is a former patient of Dr. Mare Ferrari. His wife is also a patient of mine. He has a history of PVCs and is on medicine for suppression of those PVCs and also possible hypertension. Blood pressure initially 155/83 became down to 120/78. He was noted to have a few PVCs today but said he did not take his medicine this morning. Other than that he denies any medical problems. He has a history of multiple orthopedic surgeries and gallbladder surgery in the past. He has a chronic left-sided chest pain which is sharp and he can point to with his finger. This is along the left sternal border and generally does not move, worsened with exertion or is not relieved by rest. He's had 2 heart catheterizations last 5 years, neither study indicated any significant obstructive coronary disease.  PMHx:  Past Medical History:  Diagnosis Date  . Arthritis   . Asthma    only if develops cold  . Kidney stone on left side   . PVC (premature ventricular contraction)   . Recurrent upper respiratory infection (URI) 03/2011    Past Surgical History:  Procedure Laterality Date  . APPENDECTOMY    . BACK SURGERY     x 4  . CARDIAC CATHETERIZATION     july 2010-minor irregl  . CERVICAL SPINE SURGERY     x 2  . CHOLECYSTECTOMY     2008  . EYE SURGERY     bilat cataract  . INCISION AND DRAINAGE HIP  07/18/2011   Procedure: IRRIGATION AND DEBRIDEMENT HIP;  Surgeon: Garald Balding, MD;  Location: [facility];  Service: Orthopedics;  Laterality: Right;  RIGHT HIP EXPLORATION, EXCISION OF DEEP BURSAL SAC  . KIDNEY STONE SURGERY  2011   R  . KNEE ARTHROSCOPY     Left  . SHOULDER ARTHROSCOPY     Left  . TOTAL HIP ARTHROPLASTY     Right  . TOTAL HIP REVISION     Right  . TOTAL KNEE ARTHROPLASTY Right 03/31/2014   Procedure: TOTAL KNEE ARTHROPLASTY;  Surgeon: Garald Balding, MD;  Location: [facility];  Service: Orthopedics;  Laterality: Right;  . WRIST SURGERY     bilateral, plates    FAMHx:  Family History  Problem Relation Age of Onset  . Heart disease Mother   . Heart attack Mother   . Anesthesia problems Neg Hx   . Hypotension Neg Hx   . Malignant hyperthermia Neg Hx   . Pseudochol deficiency Neg Hx     SOCHx:   reports that he has never smoked. He has never used smokeless tobacco. He reports that he does not drink alcohol or use drugs.  ALLERGIES:  Allergies  Allergen Reactions  . Tape Other (See Comments)    Adhesive tape blisters  . Erythromycin Rash and Other (See Comments)    unknown  . Sulfa Antibiotics Rash  . Tetracyclines & Related Rash    ROS: Pertinent items noted in HPI and remainder of comprehensive ROS otherwise negative.  HOME MEDS: Current Outpatient Prescriptions  Medication Sig Dispense Refill  . albuterol (PROVENTIL HFA;VENTOLIN HFA) 108 (90 BASE) MCG/ACT inhaler Inhale 2 puffs into the lungs every 6 (six) hours as needed for wheezing or shortness of breath. 1 Inhaler 3  . Ascorbic Acid (VITAMIN C) 500 MG tablet Take 1 tablet (  500 mg total) by mouth daily. 30 tablet   . aspirin 81 MG tablet Take 81 mg by mouth daily.    . metoprolol tartrate (LOPRESSOR) 25 MG tablet Take 1 tablet (25 mg total) by mouth daily. 90 tablet 2  . pantoprazole (PROTONIX) 40 MG tablet Take 1 tablet (40 mg total) by mouth daily. 90 tablet 2  . potassium chloride SA (K-DUR,KLOR-CON) 20 MEQ tablet Take 1 tablet (20 mEq total) by mouth daily. 90 tablet 2   No current facility-administered medications for this visit.     LABS/IMAGING: No results found for this or any previous visit (from the past 48 hour(s)). No results found.  WEIGHTS: Wt Readings from Last 3 Encounters:  04/12/16 236 lb 9.6 oz (107.3 kg)  10/15/15 234 lb 1.9 oz (106.2 kg)  01/27/15 240 lb (108.9 kg)    VITALS: BP 120/78   Pulse 68   Ht 6\' 4"  (1.93 m)   Wt  236 lb 9.6 oz (107.3 kg)   BMI 28.80 kg/m   EXAM: General appearance: alert and no distress Neck: no carotid bruit and no JVD Lungs: clear to auscultation bilaterally Heart: regular rate and rhythm, S1, S2 normal, no murmur, click, rub or gallop Abdomen: soft, non-tender; bowel sounds normal; no masses,  no organomegaly Extremities: extremities normal, atraumatic, no cyanosis or edema Pulses: 2+ and symmetric Skin: Skin color, texture, turgor normal. No rashes or lesions Neurologic: Grossly normal Psych: Pleasant  EKG: Sinus rhythm with PVCs at 68, RBBB  ASSESSMENT: 1. PVCs 2. RBBB 3. Hypertension 4. Chest wall pain  PLAN: 1.   [name] reports generally good suppression of his PVCs and he is basically asymptomatic with it. He had a few PVCs today but did not take his metoprolol this morning. He has history of hypertension in the past but this is well controlled. He does have a right bundle branch block which is been stable. Finally he's been complaining of a sharp, localized chest wall pain that he can point directly to in the left chest along the sternal border. He's had heart catheterization without evidence of significant obstructive coronary disease. I suspect this is a neuropathic or chest wall pain.  Follow-up in 6 months.  Pixie Casino, MD, [facility] Attending Cardiologist [place] 04/12/2016, 11:48 AM

## 2016-04-12 NOTE — Patient Instructions (Signed)
Your physician wants you to follow-up in: [name]. You will receive a reminder letter in the mail two months in advance. If you don't receive a letter, please call our office to schedule the follow-up appointment.  Your physician recommends that you return for lab work FASTING (to check cholesterol) in 6 months, prior to your next appointment.

## 2016-04-17 ENCOUNTER — Ambulatory Visit: Payer: Medicare Other | Admitting: Internal Medicine

## 2016-06-14 ENCOUNTER — Ambulatory Visit (INDEPENDENT_AMBULATORY_CARE_PROVIDER_SITE_OTHER): Payer: Medicare Other | Admitting: Orthopedic Surgery

## 2016-06-14 DIAGNOSIS — M1712 Unilateral primary osteoarthritis, left knee: Secondary | ICD-10-CM | POA: Diagnosis not present

## 2016-06-16 ENCOUNTER — Telehealth: Payer: Self-pay | Admitting: Internal Medicine

## 2016-06-16 NOTE — Telephone Encounter (Signed)
Low risk for surgery. No significant coronary disease with recent heart cath.  Dr. Debara Pickett

## 2016-06-16 NOTE — Telephone Encounter (Signed)
Forward to Dr Debara Pickett  last office Visit 04-12-2016  Pt dropped off request for pre-op surgical clearance for left total knee replacement. With Dr [name]. Surgery date TBA.

## 2016-06-30 DIAGNOSIS — J069 Acute upper respiratory infection, unspecified: Secondary | ICD-10-CM | POA: Diagnosis not present

## 2016-07-13 NOTE — Telephone Encounter (Signed)
Pt cleared for surgery  

## 2016-07-18 ENCOUNTER — Ambulatory Visit (INDEPENDENT_AMBULATORY_CARE_PROVIDER_SITE_OTHER): Payer: Self-pay | Admitting: Orthopedic Surgery

## 2016-07-18 ENCOUNTER — Other Ambulatory Visit (INDEPENDENT_AMBULATORY_CARE_PROVIDER_SITE_OTHER): Payer: Self-pay | Admitting: Orthopedic Surgery

## 2016-07-19 ENCOUNTER — Encounter (INDEPENDENT_AMBULATORY_CARE_PROVIDER_SITE_OTHER): Payer: Self-pay | Admitting: Orthopedic Surgery

## 2016-07-19 ENCOUNTER — Ambulatory Visit (INDEPENDENT_AMBULATORY_CARE_PROVIDER_SITE_OTHER): Payer: Medicare Other | Admitting: Orthopedic Surgery

## 2016-07-19 VITALS — BP 146/77 | HR 47 | Temp 97.8°F | Resp 14 | Ht 76.0 in | Wt 239.0 lb

## 2016-07-19 DIAGNOSIS — M25562 Pain in left knee: Secondary | ICD-10-CM | POA: Diagnosis not present

## 2016-07-19 DIAGNOSIS — M1712 Unilateral primary osteoarthritis, left knee: Secondary | ICD-10-CM

## 2016-07-19 DIAGNOSIS — G8929 Other chronic pain: Secondary | ICD-10-CM

## 2016-07-19 NOTE — Pre-Procedure Instructions (Signed)
[name]  07/19/2016      [place], [place] [address] Phone: [phone] Fax: [phone]    Your procedure is scheduled on  Tues, Nov 28 @ 10:05 AM  Report to [facility] Admitting at 8:00 AM   Call this number if you have problems the morning of surgery:  [phone]   Remember:  Do not eat food or drink liquids after midnight.  Take these medicines the morning of surgery with A SIP OF WATER Albuterol<Bring Your Inhaler With You>, Metoprolol(Lopressor),and Pantoprazole(Protonix)             Stop taking your Aspirin and Ibuprofen a week prior to surgery. No Goody's,BC's,Aleve,Advil,Motrin,Fish Oil,or any Herbal Medications.    Do not wear jewelry.  Do not wear lotions, powders,colognes,or deoderant.             Men may shave face and neck.  Do not bring valuables to the hospital.  [facility] is not responsible for any belongings or valuables.  Contacts, dentures or bridgework may not be worn into surgery.  Leave your suitcase in the car.  After surgery it may be brought to your room.  For patients admitted to the hospital, discharge time will be determined by your treatment team.  Patients discharged the day of surgery will not be allowed to drive home.    [facility]  Before surgery, you can play an important role.  Because skin is not sterile, your skin needs to be as free of germs as possible.  You can reduce the number of germs on you skin by washing with CHG (chlorahexidine gluconate) soap before surgery.  CHG is an antiseptic cleaner which kills germs and bonds with the skin to continue killing germs even after washing.  Please DO NOT use if you have an allergy to CHG or antibacterial soaps.  If your skin becomes reddened/irritated stop using the CHG and inform your nurse when you arrive at Short Stay.  Do not shave  (including legs and underarms) for at least 48 hours prior to the first CHG shower.  You may shave your face.  Please follow these instructions carefully:   1.  Shower with CHG Soap the night before surgery and the                                morning of Surgery.  2.  If you choose to wash your hair, wash your hair first as usual with your       normal shampoo.  3.  After you shampoo, rinse your hair and body thoroughly to remove the                      Shampoo.  4.  Use CHG as you would any other liquid soap.  You can apply chg directly       to the skin and wash gently with scrungie or a clean washcloth.  5.  Apply the CHG Soap to your body ONLY FROM THE NECK DOWN.        Do not use on open wounds or open sores.  Avoid contact with your eyes,       ears, mouth and genitals (private parts).  Wash genitals (private parts)       with your  normal soap.  6.  Wash thoroughly, paying special attention to the area where your surgery        will be performed.  7.  Thoroughly rinse your body with warm water from the neck down.  8.  DO NOT shower/wash with your normal soap after using and rinsing off       the CHG Soap.  9.  Pat yourself dry with a clean towel.            10.  Wear clean pajamas.            11.  Place clean sheets on your bed the night of your first shower and do not        sleep with pets.  Day of Surgery  Do not apply any lotions/deoderants the morning of surgery.  Please wear clean clothes to the hospital/surgery center.     Please read over the following fact sheets that you were given. Pain Booklet, Coughing and Deep Breathing, MRSA Information and Surgical Site Infection Prevention

## 2016-07-19 NOTE — Progress Notes (Deleted)
Office Visit Note   Patient: [name]           Date of Birth: [birth year]           MRN: [id number] Visit Date: 07/19/2016              Requested by: L.Donnie Coffin, MD [place] [address] [employer] [address] [place],  [address] PCP: Donnie Coffin, MD   Assessment & Plan: Visit Diagnoses:  1. Chronic pain of left knee   2. Primary osteoarthritis of left knee     Plan: ***  Follow-Up Instructions: Return if symptoms worsen or fail to improve.   Orders:  No orders of the defined types were placed in this encounter.  No orders of the defined types were placed in this encounter.     Procedures: No procedures performed   Clinical Data: No additional findings.   Subjective: Chief Complaint  Patient presents with  . Left Knee - Pain    HPI  Review of Systems   Objective: Vital Signs: BP (!) 146/77 (BP Location: Left Arm, Patient Position: Sitting, Cuff Size: Large)   Pulse (!) 47   Temp 97.8 F (36.6 C)   Resp 14   Ht 6\' 4"  (1.93 m)   Wt 239 lb (108.4 kg)   BMI 29.09 kg/m   Physical Exam  Ortho Exam  Specialty Comments:  No specialty comments available.  Imaging: No results found.   PMFS History: Patient Active Problem List   Diagnosis Date Noted  . Trochanteric Bursitis of right hip 07/18/2011    Priority: High  . RBBB (right bundle branch block with left anterior fascicular block) 04/12/2016  . PVC's (premature ventricular contractions) 04/12/2016  . Chest wall pain 04/12/2016  . Bronchial asthma 06/29/2014  . Urinary retention due to benign prostatic hyperplasia 04/03/2014  . Ileus, postoperative ([place]) 04/03/2014  . Osteoarthritis of right knee 04/01/2014  . S/P total knee replacement using cement 03/31/2014  . Skin rash 04/11/2013  . Pseudogout of knee 12/12/2012  . Dyspepsia 08/14/2012  . Benign hypertensive heart disease without heart failure 12/13/2010  . BPH (benign prostatic hyperplasia) 12/13/2010  . Osteoarthritis  12/13/2010  . Dyslipidemia 12/13/2010  . Status post cholecystectomy 12/13/2010   Past Medical History:  Diagnosis Date  . Arthritis   . Asthma    only if develops cold  . Kidney stone on left side   . PVC (premature ventricular contraction)   . Recurrent upper respiratory infection (URI) 03/2011    Family History  Problem Relation Age of Onset  . Heart disease Mother   . Heart attack Mother   . Anesthesia problems Neg Hx   . Hypotension Neg Hx   . Malignant hyperthermia Neg Hx   . Pseudochol deficiency Neg Hx     Past Surgical History:  Procedure Laterality Date  . APPENDECTOMY    . BACK SURGERY     x 4  . CARDIAC CATHETERIZATION     july 2010-minor irregl  . CERVICAL SPINE SURGERY     x 2  . CHOLECYSTECTOMY     2008  . EYE SURGERY     bilat cataract  . INCISION AND DRAINAGE HIP  07/18/2011   Procedure: IRRIGATION AND DEBRIDEMENT HIP;  Surgeon: Garald Balding, MD;  Location: [facility];  Service: Orthopedics;  Laterality: Right;  RIGHT HIP EXPLORATION, EXCISION OF DEEP BURSAL SAC  . KIDNEY STONE SURGERY  2011   R  . KNEE ARTHROSCOPY  Left  . SHOULDER ARTHROSCOPY     Left  . TOTAL HIP ARTHROPLASTY     Right  . TOTAL HIP REVISION     Right  . TOTAL KNEE ARTHROPLASTY Right 03/31/2014   Procedure: TOTAL KNEE ARTHROPLASTY;  Surgeon: Garald Balding, MD;  Location: [facility];  Service: Orthopedics;  Laterality: Right;  . WRIST SURGERY     bilateral, plates   Social History   Occupational History  . Not on file.   Social History Main Topics  . Smoking status: Never Smoker  . Smokeless tobacco: Never Used  . Alcohol use No  . Drug use: No  . Sexual activity: Not on file

## 2016-07-19 NOTE — Progress Notes (Signed)
[name], MD   Biagio Borg, PA-C [address] [place], [place]  [address]                             [phone]   ORTHOPAEDIC HISTORY & PHYSICAL  [name] MRN:  [id number] DOB/SEX:  11-22-1934/male  CHIEF COMPLAINT:  Painful left Knee  HISTORY: Patient is a 81 y.o. male presented with a history of pain in the left knee for 3 years. Onset of symptoms was gradual starting a few years ago with rapidly worsening course since that time. In July of this year he apparently stepped out of his car and started and had pain in the parapatellar area. He also noted at that time he had increasing swelling more than usual. Never now most recently he has gotten to the point where he is having more episodes of catching, and giving way. He can now palpate a mass in the superior patellar pouch. Prior procedures on the knee are corticosteroid injections.. Patient has been treated conservatively with over-the-counter NSAIDs and activity modification. Patient currently rates pain in the knee at 7 out of 10 with activity. There is pain at night. present.  They have been previously treated with: NSAIDS: NSAID with mild improvement  Knee injection with corticosteroid  was performed Knee injection with visco supplementation was not performed Medications: NSAID with mild improvement  PAST MEDICAL HISTORY: Patient Active Problem List   Diagnosis Date Noted  . Trochanteric Bursitis of right hip 07/18/2011    Priority: High  . RBBB (right bundle branch block with left anterior fascicular block) 04/12/2016  . PVC's (premature ventricular contractions) 04/12/2016  . Chest wall pain 04/12/2016  . Bronchial asthma 06/29/2014  . Urinary retention due to benign prostatic hyperplasia 04/03/2014  . Ileus, postoperative ([place]) 04/03/2014  . Osteoarthritis of right knee 04/01/2014  . S/P total knee replacement using cement 03/31/2014  . Skin rash 04/11/2013  . Pseudogout of knee 12/12/2012    . Dyspepsia 08/14/2012  . Benign hypertensive heart disease without heart failure 12/13/2010  . BPH (benign prostatic hyperplasia) 12/13/2010  . Osteoarthritis 12/13/2010  . Dyslipidemia 12/13/2010  . Status post cholecystectomy 12/13/2010   Past Medical History:  Diagnosis Date  . Arthritis   . Asthma    only if develops cold  . Kidney stone on left side   . PVC (premature ventricular contraction)   . Recurrent upper respiratory infection (URI) 03/2011   Past Surgical History:  Procedure Laterality Date  . APPENDECTOMY    . BACK SURGERY     x 4  . CARDIAC CATHETERIZATION     july 2010-minor irregl  . CERVICAL SPINE SURGERY     x 2  . CHOLECYSTECTOMY     2008  . EYE SURGERY     bilat cataract  . INCISION AND DRAINAGE HIP  07/18/2011   Procedure: IRRIGATION AND DEBRIDEMENT HIP;  Surgeon: Garald Balding, MD;  Location: [facility];  Service: Orthopedics;  Laterality: Right;  RIGHT HIP EXPLORATION, EXCISION OF DEEP BURSAL SAC  . KIDNEY STONE SURGERY  2011   R  . KNEE ARTHROSCOPY     Left  . SHOULDER ARTHROSCOPY     Left  . TOTAL HIP ARTHROPLASTY     Right  . TOTAL HIP REVISION     Right  . TOTAL KNEE ARTHROPLASTY Right 03/31/2014   Procedure: TOTAL KNEE ARTHROPLASTY;  Surgeon: Garald Balding, MD;  Location: [facility];  Service: Orthopedics;  Laterality: Right;  . WRIST SURGERY     bilateral, plates     MEDICATIONS PRIOR TO ADMISSION: Prior to Admission medications   Medication Sig Start Date End Date Taking? Authorizing Provider  albuterol (PROVENTIL HFA;VENTOLIN HFA) 108 (90 BASE) MCG/ACT inhaler Inhale 2 puffs into the lungs every 6 (six) hours as needed for wheezing or shortness of breath. 06/29/14  Yes Darlin Coco, MD  Ascorbic Acid (VITAMIN C) 500 MG tablet Take 1 tablet (500 mg total) by mouth daily. 12/13/10  Yes Darlin Coco, MD  aspirin 81 MG tablet Take 81 mg by mouth daily.   Yes Historical Provider, MD  ibuprofen (ADVIL,MOTRIN) 200 MG tablet Take 600  mg by mouth 2 (two) times daily as needed for headache or moderate pain.   Yes Historical Provider, MD  metoprolol tartrate (LOPRESSOR) 25 MG tablet Take 1 tablet (25 mg total) by mouth daily. 02/25/16  Yes Pixie Casino, MD  pantoprazole (PROTONIX) 40 MG tablet Take 1 tablet (40 mg total) by mouth daily. 02/25/16  Yes Pixie Casino, MD  potassium chloride SA (K-DUR,KLOR-CON) 20 MEQ tablet Take 1 tablet (20 mEq total) by mouth daily. 02/25/16  Yes Pixie Casino, MD     ALLERGIES:   Allergies  Allergen Reactions  . Tape Other (See Comments)    Adhesive tape blisters  . Erythromycin Rash  . Sulfa Antibiotics Rash  . Tetracyclines & Related Rash    REVIEW OF SYSTEMS:  Review of Systems  Constitutional: Negative.   HENT: Negative.   Eyes: Negative.   Respiratory:       History of asthma which is exacerbated by upper respiratory infections. Most recently had a upper respiratory infection and placed on antibiotics.  Cardiovascular: Negative.   Gastrointestinal: Negative.   Genitourinary:       Has a history of renal calculi  Skin: Negative.   Neurological: Negative.   Endo/Heme/Allergies: Negative.   Psychiatric/Behavioral: Negative.     FAMILY HISTORY:   Family History  Problem Relation Age of Onset  . Heart disease Mother   . Heart attack Mother   . Anesthesia problems Neg Hx   . Hypotension Neg Hx   . Malignant hyperthermia Neg Hx   . Pseudochol deficiency Neg Hx     SOCIAL HISTORY:   Social History   Occupational History  . Not on file.   Social History Main Topics  . Smoking status: Never Smoker  . Smokeless tobacco: Never Used  . Alcohol use No  . Drug use: No  . Sexual activity: Not on file     EXAMINATION:  Vital signs in last 24 hours: BP (!) 146/77 (BP Location: Left Arm, Patient Position: Sitting, Cuff Size: Large)   Pulse (!) 47   Temp 97.8 F (36.6 C)   Resp 14   Ht 6\' 4"  (1.93 m)   Wt 239 lb (108.4 kg)   BMI 29.09 kg/m   Physical Exam   Constitutional: He is oriented to person, place, and time. He appears well-developed and well-nourished.  HENT:  Head: Normocephalic and atraumatic.  Eyes: EOM are normal. Pupils are equal, round, and reactive to light.  Neck: Neck supple.  No carotid bruits  Cardiovascular: Normal rate, regular rhythm, normal heart sounds and intact distal pulses.   Pulmonary/Chest: Effort normal and breath sounds normal.  Abdominal: Soft. Bowel sounds are normal.  Musculoskeletal:       Left knee: He exhibits effusion (1+).  Neurological: He is alert and oriented to person, place, and time.  Skin: Skin is warm and dry.  Psychiatric: He has a normal mood and affect. His behavior is normal. Judgment and thought content normal.   Left Knee Exam   Tenderness  The patient is experiencing tenderness in the lateral joint line, medial joint line and patella.  Range of Motion  Extension: 20  Flexion: 110   Tests  Drawer:       Anterior - positive       Other  Sensation: normal Pulse: present Effusion: effusion (1+) present  Comments:  Crepitus with range of motion. Palpable spurs.      Imaging Review Plain radiographs demonstrate severe degenerative joint disease of the left knee. The overall alignment is mild varus. The bone quality appears to be good for age and reported activity level.  ASSESSMENT: End stage arthritis, left knee  Past Medical History:  Diagnosis Date  . Arthritis   . Asthma    only if develops cold  . Kidney stone on left side   . PVC (premature ventricular contraction)   . Recurrent upper respiratory infection (URI) 03/2011    PLAN: Plan for left total knee replacement.  The patient history, physical examination and imaging studies are consistent with advanced degenerative joint disease of the left knee. The patient has failed conservative treatment.  The clearance notes were reviewed.  After discussion with the patient it was felt that Total Knee Replacement was  indicated. The procedure,  risks, and benefits of total knee arthroplasty were presented and reviewed. The risks including but not limited to aseptic loosening, infection, blood clots, vascular and nerve injury, stiffness, patella tracking problems and fracture complications among others were discussed. The patient acknowledged the explanation, agreed to proceed with total knee replacement.  Biagio Borg 07/19/2016, 6:55 PM

## 2016-07-20 ENCOUNTER — Encounter (HOSPITAL_COMMUNITY)
Admission: RE | Admit: 2016-07-20 | Discharge: 2016-07-20 | Disposition: A | Discharge status: Home / Self Care | Payer: Medicare Other | Source: Ambulatory Visit | Attending: Orthopaedic Surgery | Admitting: Orthopaedic Surgery

## 2016-07-20 ENCOUNTER — Encounter (HOSPITAL_COMMUNITY): Payer: Self-pay

## 2016-07-20 ENCOUNTER — Ambulatory Visit (HOSPITAL_COMMUNITY)
Admission: RE | Admit: 2016-07-20 | Discharge: 2016-07-20 | Disposition: A | Discharge status: Home / Self Care | Payer: Medicare Other | Source: Ambulatory Visit | Attending: Orthopedic Surgery | Admitting: Orthopedic Surgery

## 2016-07-20 DIAGNOSIS — Z01812 Encounter for preprocedural laboratory examination: Secondary | ICD-10-CM | POA: Diagnosis not present

## 2016-07-20 DIAGNOSIS — Z01818 Encounter for other preprocedural examination: Secondary | ICD-10-CM

## 2016-07-20 HISTORY — DX: Cardiac arrhythmia, unspecified: I49.9

## 2016-07-20 HISTORY — DX: Gastro-esophageal reflux disease without esophagitis: K21.9

## 2016-07-20 LAB — URINALYSIS, ROUTINE W REFLEX MICROSCOPIC
Bilirubin Urine: NEGATIVE
GLUCOSE, UA: NEGATIVE mg/dL
Ketones, ur: NEGATIVE mg/dL
Nitrite: NEGATIVE
Protein, ur: NEGATIVE mg/dL
SPECIFIC GRAVITY, URINE: 1.017 (ref 1.005–1.030)
pH: 6 (ref 5.0–8.0)

## 2016-07-20 LAB — CBC WITH DIFFERENTIAL/PLATELET
BASOS PCT: 0 %
Basophils Absolute: 0 10*3/uL (ref 0.0–0.1)
EOS ABS: 0.3 10*3/uL (ref 0.0–0.7)
Eosinophils Relative: 4 %
HCT: 48.1 % (ref 39.0–52.0)
HEMOGLOBIN: 16.9 g/dL (ref 13.0–17.0)
LYMPHS ABS: 2.4 10*3/uL (ref 0.7–4.0)
Lymphocytes Relative: 35 %
MCH: 31.6 pg (ref 26.0–34.0)
MCHC: 35.1 g/dL (ref 30.0–36.0)
MCV: 89.9 fL (ref 78.0–100.0)
MONO ABS: 0.7 10*3/uL (ref 0.1–1.0)
MONOS PCT: 10 %
NEUTROS PCT: 51 %
Neutro Abs: 3.6 10*3/uL (ref 1.7–7.7)
Platelets: 133 10*3/uL — ABNORMAL LOW (ref 150–400)
RBC: 5.35 MIL/uL (ref 4.22–5.81)
RDW: 13.4 % (ref 11.5–15.5)
WBC: 6.9 10*3/uL (ref 4.0–10.5)

## 2016-07-20 LAB — COMPREHENSIVE METABOLIC PANEL
ALK PHOS: 49 U/L (ref 38–126)
ALT: 21 U/L (ref 17–63)
ANION GAP: 9 (ref 5–15)
AST: 24 U/L (ref 15–41)
Albumin: 4 g/dL (ref 3.5–5.0)
BILIRUBIN TOTAL: 1.7 mg/dL — AB (ref 0.3–1.2)
BUN: 14 mg/dL (ref 6–20)
CALCIUM: 9.6 mg/dL (ref 8.9–10.3)
CO2: 25 mmol/L (ref 22–32)
Chloride: 107 mmol/L (ref 101–111)
Creatinine, Ser: 0.9 mg/dL (ref 0.61–1.24)
GFR calc non Af Amer: >60 mL/min (ref >60)
Glucose, Bld: 129 mg/dL — ABNORMAL HIGH (ref 65–99)
POTASSIUM: 3.8 mmol/L (ref 3.5–5.1)
SODIUM: 141 mmol/L (ref 135–145)
TOTAL PROTEIN: 7 g/dL (ref 6.5–8.1)

## 2016-07-20 LAB — SURGICAL PCR SCREEN
MRSA, PCR: NEGATIVE
STAPHYLOCOCCUS AUREUS: NEGATIVE

## 2016-07-20 LAB — PROTIME-INR
INR: 1.09
Prothrombin Time: 14.2 seconds (ref 11.4–15.2)

## 2016-07-20 LAB — URINE MICROSCOPIC-ADD ON

## 2016-07-20 LAB — TYPE AND SCREEN
ABO/RH(D): O NEG
ANTIBODY SCREEN: NEGATIVE

## 2016-07-20 LAB — APTT: aPTT: 30 seconds (ref 24–36)

## 2016-07-20 NOTE — Telephone Encounter (Signed)
Pt. Scheduled for surgery on 08/01/16

## 2016-07-20 NOTE — Progress Notes (Signed)
PCP:Dr. Donnie Coffin @ [facility]  Cardiologist: Dr.Hilty : clearance in epic under note tab.  Pt. Stated Dr.  Alroy Dust sent clearance to Dr. Rudene Anda office--I requested this.

## 2016-07-21 LAB — URINE CULTURE

## 2016-07-21 NOTE — Progress Notes (Signed)
Anesthesia chart review: Patient is a 81 year old male scheduled for left total knee replacement on 08/01/2016 by Dr. Durward Fortes. Anesthesia type is posted as choice.  History includes nonsmoker, asthma, arthritis, PVCs, GERD, nephrolithiasis (left) '09, right TKA 03/31/14, appendectomy, cholecystectomy '11, back surgery 4 (including T1-2 laminotomy/discectomy '07), c-spine surgery, right THA '01 with revision '12.   PCP is listed as Dr. Donnie Coffin. Cardiologist is Dr. Debara Pickett, last visit 04/12/16 for follow-up PVCs and sharp, localized left chest wall pain felt to be neuropathic or musculoskeletal in nature. On 06/16/16 he wrote, "Low risk for surgery. No significant coronary disease with recent heart cath."  Meds include albuterol, vitamin C, aspirin 81 mg, Lopressor, Protonix, KCl.  BP (!) 148/74   Pulse 72   Temp 36.3 C   Resp 18   Ht 6\' 4"  (1.93 m)   Wt 237 lb 12.8 oz (107.9 kg)   SpO2 94%   BMI 28.95 kg/m   04/12/16 EKG ([facility]): SR with premature supraventricular complexes and with frequent PVCs, right bundle branch block.  03/26/09 Cardiac cath: CONCLUSIONS:  1. Minor luminal irregularities.  2. Normal left ventricular systolic function.  09/26/05 Echo: SUMMARY - Overall left ventricular systolic function was normal. There were    no left ventricular regional wall motion abnormalities. Left    ventricular wall thickness was mildly to moderately    increased. Doppler parameters were consistent with abnormal    left ventricular relaxation.  07/20/16 CXR: IMPRESSION: No evidence of acute cardiopulmonary disease.  Preoperative labs noted. Total bilirubin 1.7 (down from 1.9 on 10/15/15). AST/ALT WNL. Cr 0.90. Glucose 129. H/H 16.9/48.1. PLT 133. PT/PTT WNL. UA trace leukocytes, negative nitrites. Urine culture showed less than 10,000 colonies (insignificant growth). T&S done.   If no acute changes then I anticipate that patient can proceed as  planned.  [name] [facility] Short Stay Center/Anesthesiology Phone [phone] 07/21/2016 5:47 PM

## 2016-07-31 MED ORDER — TRANEXAMIC ACID 1000 MG/10ML IV SOLN
2000.0000 mg | INTRAVENOUS | Status: DC
  Filled 2016-07-31: qty 20

## 2016-07-31 MED ORDER — DEXTROSE 5 % IV SOLN
3.0000 g | INTRAVENOUS | Status: AC
  Administered 2016-08-01: 3 g via INTRAVENOUS
  Filled 2016-07-31: qty 3000

## 2016-08-01 ENCOUNTER — Encounter (HOSPITAL_COMMUNITY)
Admission: RE | Disposition: A | Discharge status: Home Health | Payer: Self-pay | Source: Ambulatory Visit | Attending: Orthopaedic Surgery

## 2016-08-01 ENCOUNTER — Encounter (HOSPITAL_COMMUNITY): Payer: Self-pay | Admitting: Anesthesiology

## 2016-08-01 ENCOUNTER — Inpatient Hospital Stay (HOSPITAL_COMMUNITY): Payer: Medicare Other | Admitting: Certified Registered"

## 2016-08-01 ENCOUNTER — Inpatient Hospital Stay (HOSPITAL_COMMUNITY)
Admission: RE | Admit: 2016-08-01 | Discharge: 2016-08-03 | DRG: 470 | Disposition: A | Discharge status: Home Health | Payer: Medicare Other | Source: Ambulatory Visit | Attending: Orthopaedic Surgery | Admitting: Orthopaedic Surgery

## 2016-08-01 ENCOUNTER — Inpatient Hospital Stay (HOSPITAL_COMMUNITY): Payer: Medicare Other | Admitting: Emergency Medicine

## 2016-08-01 DIAGNOSIS — K219 Gastro-esophageal reflux disease without esophagitis: Secondary | ICD-10-CM | POA: Diagnosis not present

## 2016-08-01 DIAGNOSIS — M25462 Effusion, left knee: Secondary | ICD-10-CM | POA: Diagnosis present

## 2016-08-01 DIAGNOSIS — Z9109 Other allergy status, other than to drugs and biological substances: Secondary | ICD-10-CM | POA: Diagnosis not present

## 2016-08-01 DIAGNOSIS — J45909 Unspecified asthma, uncomplicated: Secondary | ICD-10-CM | POA: Diagnosis present

## 2016-08-01 DIAGNOSIS — M1712 Unilateral primary osteoarthritis, left knee: Secondary | ICD-10-CM | POA: Diagnosis not present

## 2016-08-01 DIAGNOSIS — I4510 Unspecified right bundle-branch block: Secondary | ICD-10-CM | POA: Diagnosis present

## 2016-08-01 DIAGNOSIS — Z9049 Acquired absence of other specified parts of digestive tract: Secondary | ICD-10-CM | POA: Diagnosis not present

## 2016-08-01 DIAGNOSIS — D62 Acute posthemorrhagic anemia: Secondary | ICD-10-CM | POA: Diagnosis not present

## 2016-08-01 DIAGNOSIS — I493 Ventricular premature depolarization: Secondary | ICD-10-CM | POA: Diagnosis not present

## 2016-08-01 DIAGNOSIS — I452 Bifascicular block: Secondary | ICD-10-CM | POA: Diagnosis not present

## 2016-08-01 DIAGNOSIS — N401 Enlarged prostate with lower urinary tract symptoms: Secondary | ICD-10-CM | POA: Diagnosis present

## 2016-08-01 DIAGNOSIS — Z87442 Personal history of urinary calculi: Secondary | ICD-10-CM | POA: Diagnosis not present

## 2016-08-01 DIAGNOSIS — Z882 Allergy status to sulfonamides status: Secondary | ICD-10-CM | POA: Diagnosis not present

## 2016-08-01 DIAGNOSIS — I10 Essential (primary) hypertension: Secondary | ICD-10-CM | POA: Diagnosis not present

## 2016-08-01 DIAGNOSIS — E785 Hyperlipidemia, unspecified: Secondary | ICD-10-CM | POA: Diagnosis present

## 2016-08-01 DIAGNOSIS — M25762 Osteophyte, left knee: Secondary | ICD-10-CM | POA: Diagnosis present

## 2016-08-01 DIAGNOSIS — Z8249 Family history of ischemic heart disease and other diseases of the circulatory system: Secondary | ICD-10-CM | POA: Diagnosis not present

## 2016-08-01 DIAGNOSIS — R338 Other retention of urine: Secondary | ICD-10-CM | POA: Diagnosis present

## 2016-08-01 DIAGNOSIS — Z881 Allergy status to other antibiotic agents status: Secondary | ICD-10-CM | POA: Diagnosis not present

## 2016-08-01 DIAGNOSIS — I119 Hypertensive heart disease without heart failure: Secondary | ICD-10-CM | POA: Diagnosis present

## 2016-08-01 DIAGNOSIS — M25562 Pain in left knee: Secondary | ICD-10-CM | POA: Diagnosis not present

## 2016-08-01 DIAGNOSIS — G8918 Other acute postprocedural pain: Secondary | ICD-10-CM | POA: Diagnosis not present

## 2016-08-01 DIAGNOSIS — Z96652 Presence of left artificial knee joint: Secondary | ICD-10-CM

## 2016-08-01 HISTORY — PX: TOTAL KNEE ARTHROPLASTY: SHX125

## 2016-08-01 SURGERY — ARTHROPLASTY, KNEE, TOTAL
Anesthesia: Regional | Site: Knee | Laterality: Left

## 2016-08-01 MED ORDER — ONDANSETRON HCL 4 MG/2ML IJ SOLN
INTRAMUSCULAR | Status: DC | PRN
  Administered 2016-08-01: 4 mg via INTRAVENOUS

## 2016-08-01 MED ORDER — LACTATED RINGERS IV SOLN
INTRAVENOUS | Status: DC
  Administered 2016-08-01 (×3): via INTRAVENOUS

## 2016-08-01 MED ORDER — PHENYLEPHRINE HCL 10 MG/ML IJ SOLN
INTRAMUSCULAR | Status: AC
  Filled 2016-08-01: qty 1

## 2016-08-01 MED ORDER — KETOROLAC TROMETHAMINE 15 MG/ML IJ SOLN
7.5000 mg | Freq: Four times a day (QID) | INTRAMUSCULAR | Status: AC
  Administered 2016-08-01 – 2016-08-02 (×3): 7.5 mg via INTRAVENOUS
  Filled 2016-08-01 (×3): qty 1

## 2016-08-01 MED ORDER — LIDOCAINE HCL (CARDIAC) 20 MG/ML IV SOLN
INTRAVENOUS | Status: DC | PRN
  Administered 2016-08-01: 100 mg via INTRAVENOUS

## 2016-08-01 MED ORDER — PHENYLEPHRINE HCL 10 MG/ML IJ SOLN
INTRAVENOUS | Status: DC | PRN
  Administered 2016-08-01: 50 ug/min via INTRAVENOUS

## 2016-08-01 MED ORDER — FENTANYL CITRATE (PF) 100 MCG/2ML IJ SOLN
100.0000 ug | Freq: Once | INTRAMUSCULAR | Status: AC
Start: 2016-08-01 — End: 2016-08-01
  Administered 2016-08-01: 100 ug via INTRAVENOUS

## 2016-08-01 MED ORDER — PHENOL 1.4 % MT LIQD: 1.0000 | OROMUCOSAL | Status: DC | PRN

## 2016-08-01 MED ORDER — SODIUM CHLORIDE 0.9 % IV SOLN
INTRAVENOUS | Status: DC
  Administered 2016-08-01: 75 mL/h via INTRAVENOUS

## 2016-08-01 MED ORDER — HYDROMORPHONE HCL 2 MG/ML IJ SOLN
INTRAMUSCULAR | Status: AC
Start: 2016-08-01 — End: 2016-08-02
  Filled 2016-08-01: qty 1

## 2016-08-01 MED ORDER — CHLORHEXIDINE GLUCONATE 4 % EX LIQD: 60.0000 mL | Freq: Once | CUTANEOUS | Status: DC

## 2016-08-01 MED ORDER — METOCLOPRAMIDE HCL 5 MG PO TABS
5.0000 mg | ORAL_TABLET | Freq: Three times a day (TID) | ORAL | Status: DC
Start: 2016-08-01 — End: 2016-08-03
  Administered 2016-08-02: 10 mg via ORAL
  Administered 2016-08-03: 5 mg via ORAL
  Filled 2016-08-01: qty 1
  Filled 2016-08-01 (×2): qty 2

## 2016-08-01 MED ORDER — SODIUM CHLORIDE 0.9 % IR SOLN
Status: DC | PRN
  Administered 2016-08-01: 3000 mL

## 2016-08-01 MED ORDER — METHOCARBAMOL 500 MG PO TABS
500.0000 mg | ORAL_TABLET | Freq: Four times a day (QID) | ORAL | Status: DC | PRN
  Administered 2016-08-01 – 2016-08-02 (×2): 500 mg via ORAL
  Filled 2016-08-01: qty 1

## 2016-08-01 MED ORDER — DOCUSATE SODIUM 100 MG PO CAPS
100.0000 mg | ORAL_CAPSULE | Freq: Two times a day (BID) | ORAL | Status: DC
  Administered 2016-08-01 – 2016-08-03 (×4): 100 mg via ORAL
  Filled 2016-08-01 (×4): qty 1

## 2016-08-01 MED ORDER — OXYCODONE HCL 5 MG PO TABS
5.0000 mg | ORAL_TABLET | ORAL | Status: DC | PRN
  Administered 2016-08-01: 5 mg via ORAL
  Administered 2016-08-02 – 2016-08-03 (×5): 10 mg via ORAL
  Filled 2016-08-01 (×6): qty 2

## 2016-08-01 MED ORDER — BISACODYL 10 MG RE SUPP: 10.0000 mg | Freq: Every day | RECTAL | Status: DC | PRN

## 2016-08-01 MED ORDER — ALBUTEROL SULFATE (2.5 MG/3ML) 0.083% IN NEBU: 2.5000 mg | INHALATION_SOLUTION | Freq: Four times a day (QID) | RESPIRATORY_TRACT | Status: DC | PRN

## 2016-08-01 MED ORDER — ACETAMINOPHEN 10 MG/ML IV SOLN
1000.0000 mg | Freq: Four times a day (QID) | INTRAVENOUS | Status: AC
  Administered 2016-08-01 – 2016-08-02 (×3): 1000 mg via INTRAVENOUS
  Filled 2016-08-01 (×4): qty 100

## 2016-08-01 MED ORDER — HYDROMORPHONE HCL 1 MG/ML IJ SOLN
INTRAMUSCULAR | Status: AC
  Filled 2016-08-01: qty 0.5

## 2016-08-01 MED ORDER — PHENYLEPHRINE HCL 10 MG/ML IJ SOLN
INTRAMUSCULAR | Status: DC | PRN
  Administered 2016-08-01: 80 ug via INTRAVENOUS

## 2016-08-01 MED ORDER — DIPHENHYDRAMINE HCL 12.5 MG/5ML PO ELIX: 12.5000 mg | ORAL_SOLUTION | ORAL | Status: DC | PRN

## 2016-08-01 MED ORDER — PANTOPRAZOLE SODIUM 40 MG PO TBEC
40.0000 mg | DELAYED_RELEASE_TABLET | Freq: Every day | ORAL | Status: DC
  Administered 2016-08-02 – 2016-08-03 (×2): 40 mg via ORAL
  Filled 2016-08-01 (×2): qty 1

## 2016-08-01 MED ORDER — MAGNESIUM CITRATE PO SOLN: 1.0000 | Freq: Once | ORAL | Status: DC | PRN

## 2016-08-01 MED ORDER — 0.9 % SODIUM CHLORIDE (POUR BTL) OPTIME
TOPICAL | Status: DC | PRN
  Administered 2016-08-01: 1000 mL

## 2016-08-01 MED ORDER — BUPIVACAINE HCL (PF) 0.25 % IJ SOLN
INTRAMUSCULAR | Status: DC | PRN
  Administered 2016-08-01: 30 mL

## 2016-08-01 MED ORDER — OXYCODONE HCL 5 MG PO TABS
ORAL_TABLET | ORAL | Status: AC
  Filled 2016-08-01: qty 1

## 2016-08-01 MED ORDER — FENTANYL CITRATE (PF) 100 MCG/2ML IJ SOLN
INTRAMUSCULAR | Status: DC | PRN
  Administered 2016-08-01: 100 ug via INTRAVENOUS
  Administered 2016-08-01 (×2): 50 ug via INTRAVENOUS

## 2016-08-01 MED ORDER — NEOSTIGMINE METHYLSULFATE 10 MG/10ML IV SOLN
INTRAVENOUS | Status: DC | PRN
  Administered 2016-08-01: 4 mg via INTRAVENOUS

## 2016-08-01 MED ORDER — METHOCARBAMOL 1000 MG/10ML IJ SOLN: 500.0000 mg | Freq: Four times a day (QID) | INTRAVENOUS | Status: DC | PRN

## 2016-08-01 MED ORDER — ROCURONIUM BROMIDE 100 MG/10ML IV SOLN
INTRAVENOUS | Status: DC | PRN
  Administered 2016-08-01: 50 mg via INTRAVENOUS

## 2016-08-01 MED ORDER — ARTIFICIAL TEARS OP OINT
TOPICAL_OINTMENT | OPHTHALMIC | Status: AC
  Filled 2016-08-01: qty 3.5

## 2016-08-01 MED ORDER — METOPROLOL TARTRATE 25 MG PO TABS
25.0000 mg | ORAL_TABLET | Freq: Every day | ORAL | Status: DC
  Administered 2016-08-02 – 2016-08-03 (×2): 25 mg via ORAL
  Filled 2016-08-01 (×2): qty 1

## 2016-08-01 MED ORDER — LIDOCAINE 2% (20 MG/ML) 5 ML SYRINGE
INTRAMUSCULAR | Status: AC
  Filled 2016-08-01: qty 10

## 2016-08-01 MED ORDER — POLYETHYLENE GLYCOL 3350 17 G PO PACK: 17.0000 g | PACK | Freq: Every day | ORAL | Status: DC | PRN

## 2016-08-01 MED ORDER — HYDROMORPHONE HCL 1 MG/ML IJ SOLN
0.2500 mg | INTRAMUSCULAR | Status: DC | PRN
  Administered 2016-08-01 (×4): 0.5 mg via INTRAVENOUS

## 2016-08-01 MED ORDER — MEPERIDINE HCL 25 MG/ML IJ SOLN: 6.2500 mg | INTRAMUSCULAR | Status: DC | PRN

## 2016-08-01 MED ORDER — HYDROMORPHONE HCL 1 MG/ML IJ SOLN
0.5000 mg | INTRAMUSCULAR | Status: DC | PRN
Start: 2016-08-01 — End: 2016-08-01
  Administered 2016-08-01: 0.5 mg via INTRAVENOUS

## 2016-08-01 MED ORDER — METHOCARBAMOL 500 MG PO TABS
ORAL_TABLET | ORAL | Status: AC
  Filled 2016-08-01: qty 1

## 2016-08-01 MED ORDER — FENTANYL CITRATE (PF) 100 MCG/2ML IJ SOLN
INTRAMUSCULAR | Status: AC
  Filled 2016-08-01: qty 2

## 2016-08-01 MED ORDER — ACETAMINOPHEN 10 MG/ML IV SOLN
1000.0000 mg | Freq: Four times a day (QID) | INTRAVENOUS | Status: DC
  Administered 2016-08-01: 1000 mg via INTRAVENOUS
  Filled 2016-08-01: qty 100

## 2016-08-01 MED ORDER — SODIUM CHLORIDE 0.9 % IV SOLN: INTRAVENOUS | Status: DC

## 2016-08-01 MED ORDER — BUPIVACAINE-EPINEPHRINE (PF) 0.5% -1:200000 IJ SOLN
INTRAMUSCULAR | Status: DC | PRN
  Administered 2016-08-01: 30 mL via PERINEURAL

## 2016-08-01 MED ORDER — EPHEDRINE SULFATE 50 MG/ML IJ SOLN
INTRAMUSCULAR | Status: DC | PRN
  Administered 2016-08-01 (×3): 5 mg via INTRAVENOUS

## 2016-08-01 MED ORDER — GLYCOPYRROLATE 0.2 MG/ML IJ SOLN
INTRAMUSCULAR | Status: DC | PRN
  Administered 2016-08-01: 0.6 mg via INTRAVENOUS

## 2016-08-01 MED ORDER — BUPIVACAINE HCL (PF) 0.25 % IJ SOLN
INTRAMUSCULAR | Status: AC
  Filled 2016-08-01: qty 30

## 2016-08-01 MED ORDER — POTASSIUM CHLORIDE CRYS ER 20 MEQ PO TBCR
20.0000 meq | EXTENDED_RELEASE_TABLET | Freq: Every day | ORAL | Status: DC
  Administered 2016-08-02 – 2016-08-03 (×2): 20 meq via ORAL
  Filled 2016-08-01 (×2): qty 1

## 2016-08-01 MED ORDER — TRANEXAMIC ACID 1000 MG/10ML IV SOLN
INTRAVENOUS | Status: DC | PRN
  Administered 2016-08-01: 2000 mg via TOPICAL

## 2016-08-01 MED ORDER — HYDROMORPHONE HCL 2 MG/ML IJ SOLN: 0.5000 mg | INTRAMUSCULAR | Status: DC | PRN

## 2016-08-01 MED ORDER — MENTHOL 3 MG MT LOZG: 1.0000 | LOZENGE | OROMUCOSAL | Status: DC | PRN

## 2016-08-01 MED ORDER — METOCLOPRAMIDE HCL 5 MG/ML IJ SOLN: 10.0000 mg | Freq: Once | INTRAMUSCULAR | Status: DC | PRN

## 2016-08-01 MED ORDER — METOCLOPRAMIDE HCL 5 MG/ML IJ SOLN: 5.0000 mg | Freq: Three times a day (TID) | INTRAMUSCULAR | Status: DC

## 2016-08-01 MED ORDER — CEFAZOLIN SODIUM-DEXTROSE 2-4 GM/100ML-% IV SOLN
2.0000 g | Freq: Four times a day (QID) | INTRAVENOUS | Status: AC
  Administered 2016-08-02: 2 g via INTRAVENOUS
  Filled 2016-08-01 (×2): qty 100

## 2016-08-01 MED ORDER — KETOROLAC TROMETHAMINE 30 MG/ML IJ SOLN
INTRAMUSCULAR | Status: AC
  Administered 2016-08-01: 30 mg
  Filled 2016-08-01: qty 1

## 2016-08-01 MED ORDER — RIVAROXABAN 10 MG PO TABS
10.0000 mg | ORAL_TABLET | Freq: Every day | ORAL | Status: DC
  Administered 2016-08-02: 10 mg via ORAL
  Filled 2016-08-01: qty 1

## 2016-08-01 MED ORDER — PROPOFOL 10 MG/ML IV BOLUS
INTRAVENOUS | Status: DC | PRN
  Administered 2016-08-01: 140 mg via INTRAVENOUS

## 2016-08-01 MED ORDER — MIDAZOLAM HCL 2 MG/2ML IJ SOLN
INTRAMUSCULAR | Status: AC
Start: 2016-08-01 — End: 2016-08-01
  Filled 2016-08-01: qty 2

## 2016-08-01 MED ORDER — ARTIFICIAL TEARS OP OINT
TOPICAL_OINTMENT | OPHTHALMIC | Status: DC | PRN
  Administered 2016-08-01: 1 via OPHTHALMIC

## 2016-08-01 MED ORDER — ALUM & MAG HYDROXIDE-SIMETH 200-200-20 MG/5ML PO SUSP: 30.0000 mL | ORAL | Status: DC | PRN

## 2016-08-01 SURGICAL SUPPLY — 62 items
BAG DECANTER FOR FLEXI CONT (MISCELLANEOUS) ×2 IMPLANT
BANDAGE ESMARK 6X9 LF (GAUZE/BANDAGES/DRESSINGS) ×1 IMPLANT
BLADE SAGITTAL 25.0X1.19X90 (BLADE) ×2 IMPLANT
BNDG CMPR 9X6 STRL LF SNTH (GAUZE/BANDAGES/DRESSINGS) ×1
BNDG ESMARK 6X9 LF (GAUZE/BANDAGES/DRESSINGS) ×2
BOWL SMART MIX CTS (DISPOSABLE) ×2 IMPLANT
CAP KNEE TOTAL 3 SIGMA ×1 IMPLANT
CEMENT HV SMART SET (Cement) ×4 IMPLANT
COVER SURGICAL LIGHT HANDLE (MISCELLANEOUS) ×2 IMPLANT
CUFF TOURNIQUET SINGLE 34IN LL (TOURNIQUET CUFF) ×1 IMPLANT
CUFF TOURNIQUET SINGLE 44IN (TOURNIQUET CUFF) IMPLANT
DECANTER SPIKE VIAL GLASS SM (MISCELLANEOUS) ×1 IMPLANT
DRAPE EXTREMITY T 121X128X90 (DRAPE) ×1 IMPLANT
DRAPE HALF SHEET 40X57 (DRAPES) ×2 IMPLANT
DRAPE PROXIMA HALF (DRAPES) ×2 IMPLANT
DRSG ADAPTIC 3X8 NADH LF (GAUZE/BANDAGES/DRESSINGS) ×2 IMPLANT
DRSG PAD ABDOMINAL 8X10 ST (GAUZE/BANDAGES/DRESSINGS) ×4 IMPLANT
DURAPREP 26ML APPLICATOR (WOUND CARE) ×4 IMPLANT
ELECT CAUTERY BLADE 6.4 (BLADE) ×2 IMPLANT
ELECT REM PT RETURN 9FT ADLT (ELECTROSURGICAL) ×2
ELECTRODE REM PT RTRN 9FT ADLT (ELECTROSURGICAL) ×1 IMPLANT
EVACUATOR 1/8 PVC DRAIN (DRAIN) IMPLANT
FACESHIELD WRAPAROUND (MASK) ×4 IMPLANT
FACESHIELD WRAPAROUND OR TEAM (MASK) ×2 IMPLANT
FLUID NSS /IRRIG 3000 ML XXX (IV SOLUTION) ×1 IMPLANT
GAUZE SPONGE 4X4 12PLY STRL (GAUZE/BANDAGES/DRESSINGS) ×2 IMPLANT
GLOVE BIOGEL PI IND STRL 8 (GLOVE) ×1 IMPLANT
GLOVE BIOGEL PI IND STRL 8.5 (GLOVE) ×1 IMPLANT
GLOVE BIOGEL PI INDICATOR 8 (GLOVE) ×1
GLOVE BIOGEL PI INDICATOR 8.5 (GLOVE) ×1
GLOVE ECLIPSE 8.0 STRL XLNG CF (GLOVE) ×4 IMPLANT
GLOVE SURG ORTHO 8.5 STRL (GLOVE) ×4 IMPLANT
GOWN STRL REUS W/ TWL LRG LVL3 (GOWN DISPOSABLE) ×2 IMPLANT
GOWN STRL REUS W/TWL 2XL LVL3 (GOWN DISPOSABLE) ×2 IMPLANT
GOWN STRL REUS W/TWL LRG LVL3 (GOWN DISPOSABLE) ×6
HANDPIECE INTERPULSE COAX TIP (DISPOSABLE) ×2
KIT BASIN OR (CUSTOM PROCEDURE TRAY) ×2 IMPLANT
KIT ROOM TURNOVER OR (KITS) ×2 IMPLANT
MANIFOLD NEPTUNE II (INSTRUMENTS) ×2 IMPLANT
NEEDLE 22X1 1/2 (OR ONLY) (NEEDLE) ×2 IMPLANT
NS IRRIG 1000ML POUR BTL (IV SOLUTION) ×2 IMPLANT
PACK TOTAL JOINT (CUSTOM PROCEDURE TRAY) ×2 IMPLANT
PAD ARMBOARD 7.5X6 YLW CONV (MISCELLANEOUS) ×4 IMPLANT
PAD CAST 4YDX4 CTTN HI CHSV (CAST SUPPLIES) ×1 IMPLANT
PADDING CAST COTTON 4X4 STRL (CAST SUPPLIES) ×2
PADDING CAST COTTON 6X4 STRL (CAST SUPPLIES) ×2 IMPLANT
SET HNDPC FAN SPRY TIP SCT (DISPOSABLE) ×1 IMPLANT
STAPLER VISISTAT 35W (STAPLE) ×2 IMPLANT
SUCTION FRAZIER HANDLE 10FR (MISCELLANEOUS)
SUCTION TUBE FRAZIER 10FR DISP (MISCELLANEOUS) ×1 IMPLANT
SURGIFLO W/THROMBIN 8M KIT (HEMOSTASIS) IMPLANT
SUT BONE WAX W31G (SUTURE) ×2 IMPLANT
SUT ETHIBOND NAB CT1 #1 30IN (SUTURE) ×4 IMPLANT
SUT MNCRL AB 3-0 PS2 18 (SUTURE) ×2 IMPLANT
SUT VIC AB 0 CT1 27 (SUTURE) ×2
SUT VIC AB 0 CT1 27XBRD ANBCTR (SUTURE) ×1 IMPLANT
SYR CONTROL 10ML LL (SYRINGE) IMPLANT
TOWEL OR 17X24 6PK STRL BLUE (TOWEL DISPOSABLE) ×2 IMPLANT
TOWEL OR 17X26 10 PK STRL BLUE (TOWEL DISPOSABLE) ×2 IMPLANT
TRAY FOLEY BAG SILVER LF 16FR (SET/KITS/TRAYS/PACK) ×1 IMPLANT
WATER STERILE IRR 1000ML POUR (IV SOLUTION) ×2 IMPLANT
WRAP KNEE MAXI GEL POST OP (GAUZE/BANDAGES/DRESSINGS) ×2 IMPLANT

## 2016-08-01 NOTE — Anesthesia Procedure Notes (Signed)
Anesthesia Regional Block:  Femoral nerve block  Pre-Anesthetic Checklist: ,, timeout performed, Correct Patient, Correct Site, Correct Laterality, Correct Procedure, Correct Position, site marked, Risks and benefits discussed,  Surgical consent,  Pre-op evaluation,  At surgeon's request and post-op pain management  Laterality: Lower and Left  Prep: chloraprep       Needles:  Injection technique: Single-shot  Needle Type: Echogenic Stimulator Needle          Additional Needles:  Procedures: ultrasound guided (picture in chart) Femoral nerve block Narrative:  Injection made incrementally with aspirations every 5 mL.  Performed by: Personally  Anesthesiologist: Nikiesha Milford  Additional Notes: H+P and labs reviewed, risks and benefits discussed with patient, procedure tolerated well without complications

## 2016-08-01 NOTE — Anesthesia Postprocedure Evaluation (Signed)
Anesthesia Post Note  Patient: [name]  Procedure(s) Performed: Procedure(s) (LRB): TOTAL KNEE ARTHROPLASTY (Left)  Patient location during evaluation: PACU Anesthesia Type: General and Regional Level of consciousness: awake and alert and oriented Pain management: satisfactory to patient Vital Signs Assessment: post-procedure vital signs reviewed and stable Respiratory status: spontaneous breathing, nonlabored ventilation, respiratory function stable and patient connected to nasal cannula oxygen Cardiovascular status: blood pressure returned to baseline and stable Postop Assessment: no signs of nausea or vomiting Anesthetic complications: no    Last Vitals:  Vitals:   08/01/16 1341 08/01/16 1345  BP: 121/84   Pulse: (!) 51 (!) 51  Resp: 13 15  Temp:      Last Pain:  Vitals:   08/01/16 1345  TempSrc:   PainSc: 10-Worst pain ever                 Esty Ahuja A.

## 2016-08-01 NOTE — Anesthesia Preprocedure Evaluation (Signed)
Anesthesia Evaluation  Patient identified by MRN, date of birth, ID band Patient awake    Reviewed: Allergy & Precautions, NPO status , Patient's Chart, lab work & pertinent test results  Airway Mallampati: II  TM Distance: >3 FB Neck ROM: Full    Dental no notable dental hx. (+) Teeth Intact   Pulmonary asthma , Recent URI , Resolved,    Pulmonary exam normal breath sounds clear to auscultation       Cardiovascular Normal cardiovascular exam+ dysrhythmias  Rhythm:Regular Rate:Normal  RBBB PVC's controlled with metoprolol   Neuro/Psych negative neurological ROS  negative psych ROS   GI/Hepatic GERD  Medicated and Controlled,  Endo/Other  Hyperlipidemia  Renal/GU Renal diseaseHx/o Renal calculus   BPH    Musculoskeletal  (+) Arthritis , OA left Knee   Abdominal   Peds  Hematology   Anesthesia Other Findings   Reproductive/Obstetrics                             Anesthesia Physical Anesthesia Plan  ASA: II  Anesthesia Plan: General and Regional   Post-op Pain Management:  Regional for Post-op pain   Induction: Intravenous  Airway Management Planned: Oral ETT  Additional Equipment:   Intra-op Plan:   Post-operative Plan: Extubation in OR  Informed Consent: I have reviewed the patients History and Physical, chart, labs and discussed the procedure including the risks, benefits and alternatives for the proposed anesthesia with the patient or authorized representative who has indicated his/her understanding and acceptance.   Dental advisory given  Plan Discussed with: Anesthesiologist, CRNA and Surgeon  Anesthesia Plan Comments:         Anesthesia Quick Evaluation

## 2016-08-01 NOTE — Anesthesia Procedure Notes (Signed)
Procedure Name: Intubation Date/Time: 08/01/2016 10:22 AM Performed by: Gaylene Brooks Pre-anesthesia Checklist: Patient identified, Emergency Drugs available, Suction available, Patient being monitored and Timeout performed Patient Re-evaluated:Patient Re-evaluated prior to inductionOxygen Delivery Method: Circle system utilized Preoxygenation: Pre-oxygenation with 100% oxygen Intubation Type: IV induction Ventilation: Mask ventilation without difficulty and Oral airway inserted - appropriate to patient size Laryngoscope Size: Sabra Heck and 2 Grade View: Grade I Tube type: Oral Tube size: 7.5 mm Number of attempts: 1 Airway Equipment and Method: Stylet Placement Confirmation: ETT inserted through vocal cords under direct vision,  breath sounds checked- equal and bilateral and positive ETCO2 Secured at: 22 cm Tube secured with: Tape Dental Injury: Teeth and Oropharynx as per pre-operative assessment

## 2016-08-01 NOTE — H&P (Signed)
ORTHOPAEDIC HISTORY & PHYSICAL  [name] MRN:  [id number] DOB/SEX:  Sep 07, 1934/male  CHIEF COMPLAINT:  Painful left Knee  HISTORY: Patient is a 81 y.o. male presented with a history of pain in the left knee for 3 years. Onset of symptoms was gradual starting a few years ago with rapidly worsening course since that time. In July of this year he apparently stepped out of his car and started and had pain in the parapatellar area. He also noted at that time he had increasing swelling more than usual. Never now most recently he has gotten to the point where he is having more episodes of catching, and giving way. He can now palpate a mass in the superior patellar pouch. Prior procedures on the knee are corticosteroid injections.. Patient has been treated conservatively with over-the-counter NSAIDs and activity modification. Patient currently rates pain in the knee at 7 out of 10 with activity. There is pain at night. present.  They have been previously treated with: NSAIDS: NSAID with mild improvement  Knee injection with corticosteroid  was performed Knee injection with visco supplementation was not performed Medications: NSAID with mild improvement  PAST MEDICAL HISTORY:      Patient Active Problem List   Diagnosis Date Noted  . Trochanteric Bursitis of right hip 07/18/2011    Priority: High  . RBBB (right bundle branch block with left anterior fascicular block) 04/12/2016  . PVC's (premature ventricular contractions) 04/12/2016  . Chest wall pain 04/12/2016  . Bronchial asthma 06/29/2014  . Urinary retention due to benign prostatic hyperplasia 04/03/2014  . Ileus, postoperative ([place]) 04/03/2014  . Osteoarthritis of right knee 04/01/2014  . S/P total knee replacement using cement 03/31/2014  . Skin rash 04/11/2013  . Pseudogout of knee 12/12/2012  . Dyspepsia 08/14/2012  . Benign hypertensive heart disease without heart failure 12/13/2010  . BPH (benign prostatic  hyperplasia) 12/13/2010  . Osteoarthritis 12/13/2010  . Dyslipidemia 12/13/2010  . Status post cholecystectomy 12/13/2010       Past Medical History:  Diagnosis Date  . Arthritis   . Asthma    only if develops cold  . Kidney stone on left side   . PVC (premature ventricular contraction)   . Recurrent upper respiratory infection (URI) 03/2011        Past Surgical History:  Procedure Laterality Date  . APPENDECTOMY    . BACK SURGERY     x 4  . CARDIAC CATHETERIZATION     july 2010-minor irregl  . CERVICAL SPINE SURGERY     x 2  . CHOLECYSTECTOMY     2008  . EYE SURGERY     bilat cataract  . INCISION AND DRAINAGE HIP  07/18/2011   Procedure: IRRIGATION AND DEBRIDEMENT HIP;  Surgeon: Garald Balding, MD;  Location: [facility];  Service: Orthopedics;  Laterality: Right;  RIGHT HIP EXPLORATION, EXCISION OF DEEP BURSAL SAC  . KIDNEY STONE SURGERY  2011   R  . KNEE ARTHROSCOPY     Left  . SHOULDER ARTHROSCOPY     Left  . TOTAL HIP ARTHROPLASTY     Right  . TOTAL HIP REVISION     Right  . TOTAL KNEE ARTHROPLASTY Right 03/31/2014   Procedure: TOTAL KNEE ARTHROPLASTY;  Surgeon: Garald Balding, MD;  Location: [facility];  Service: Orthopedics;  Laterality: Right;  . WRIST SURGERY     bilateral, plates     MEDICATIONS PRIOR TO ADMISSION:        Prior to  Admission medications   Medication Sig Start Date End Date Taking? Authorizing Provider  albuterol (PROVENTIL HFA;VENTOLIN HFA) 108 (90 BASE) MCG/ACT inhaler Inhale 2 puffs into the lungs every 6 (six) hours as needed for wheezing or shortness of breath. 06/29/14  Yes Darlin Coco, MD  Ascorbic Acid (VITAMIN C) 500 MG tablet Take 1 tablet (500 mg total) by mouth daily. 12/13/10  Yes Darlin Coco, MD  aspirin 81 MG tablet Take 81 mg by mouth daily.   Yes Historical Provider, MD  ibuprofen (ADVIL,MOTRIN) 200 MG tablet Take 600 mg by mouth 2 (two) times daily as needed for headache  or moderate pain.   Yes Historical Provider, MD  metoprolol tartrate (LOPRESSOR) 25 MG tablet Take 1 tablet (25 mg total) by mouth daily. 02/25/16  Yes Pixie Casino, MD  pantoprazole (PROTONIX) 40 MG tablet Take 1 tablet (40 mg total) by mouth daily. 02/25/16  Yes Pixie Casino, MD  potassium chloride SA (K-DUR,KLOR-CON) 20 MEQ tablet Take 1 tablet (20 mEq total) by mouth daily. 02/25/16  Yes Pixie Casino, MD     ALLERGIES:        Allergies  Allergen Reactions  . Tape Other (See Comments)    Adhesive tape blisters  . Erythromycin Rash  . Sulfa Antibiotics Rash  . Tetracyclines & Related Rash    REVIEW OF SYSTEMS:  Review of Systems  Constitutional: Negative.   HENT: Negative.   Eyes: Negative.   Respiratory:       History of asthma which is exacerbated by upper respiratory infections. Most recently had a upper respiratory infection and placed on antibiotics.  Cardiovascular: Negative.   Gastrointestinal: Negative.   Genitourinary:       Has a history of renal calculi  Skin: Negative.   Neurological: Negative.   Endo/Heme/Allergies: Negative.   Psychiatric/Behavioral: Negative.     FAMILY HISTORY:        Family History  Problem Relation Age of Onset  . Heart disease Mother   . Heart attack Mother   . Anesthesia problems Neg Hx   . Hypotension Neg Hx   . Malignant hyperthermia Neg Hx   . Pseudochol deficiency Neg Hx     SOCIAL HISTORY:   Social History      Occupational History  . Not on file.       Social History Main Topics  . Smoking status: Never Smoker  . Smokeless tobacco: Never Used  . Alcohol use No  . Drug use: No  . Sexual activity: Not on file     EXAMINATION:  Vital signs in last 24 hours: BP (!) 146/77 (BP Location: Left Arm, Patient Position: Sitting, Cuff Size: Large)   Pulse (!) 47   Temp 97.8 F (36.6 C)   Resp 14   Ht 6\' 4"  (1.93 m)   Wt 239 lb (108.4 kg)   BMI 29.09 kg/m   Physical Exam    Constitutional: He is oriented to person, place, and time. He appears well-developed and well-nourished.  HENT:  Head: Normocephalic and atraumatic.  Eyes: EOM are normal. Pupils are equal, round, and reactive to light.  Neck: Neck supple.  No carotid bruits  Cardiovascular: Normal rate, regular rhythm, normal heart sounds and intact distal pulses.   Pulmonary/Chest: Effort normal and breath sounds normal.  Abdominal: Soft. Bowel sounds are normal.  Musculoskeletal:       Left knee: He exhibits effusion (1+).  Neurological: He is alert and oriented to person, place, and time.  Skin: Skin is warm and dry.  Psychiatric: He has a normal mood and affect. His behavior is normal. Judgment and thought content normal.   Left Knee Exam   Tenderness  The patient is experiencing tenderness in the lateral joint line, medial joint line and patella.  Range of Motion  Extension: 20  Flexion: 110   Tests  Drawer:       Anterior - positive       Other  Sensation: normal Pulse: present Effusion: effusion (1+) present  Comments:  Crepitus with range of motion. Palpable spurs.     Imaging Review Plain radiographs demonstrate severe degenerative joint disease of the left knee. The overall alignment is mild varus. The bone quality appears to be good for age and reported activity level.  ASSESSMENT: End stage arthritis, left knee      Past Medical History:  Diagnosis Date  . Arthritis   . Asthma    only if develops cold  . Kidney stone on left side   . PVC (premature ventricular contraction)   . Recurrent upper respiratory infection (URI) 03/2011    PLAN: Plan for left total knee replacement.  The patient history, physical examination and imaging studies are consistent with advanced degenerative joint disease of the left knee. The patient has failed conservative treatment.  The clearance notes were reviewed.  After discussion with the patient it was felt that  Total Knee Replacement was indicated. The procedure,  risks, and benefits of total knee arthroplasty were presented and reviewed. The risks including but not limited to aseptic loosening, infection, blood clots, vascular and nerve injury, stiffness, patella tracking problems and fracture complications among others were discussed. The patient acknowledged the explanation, agreed to proceed with total knee replacement.  Mike Craze [place], [place] [phone]  08/01/2016 10:16 AM

## 2016-08-01 NOTE — Anesthesia Procedure Notes (Deleted)
Performed by: Kaniya Trueheart M       

## 2016-08-01 NOTE — H&P (Signed)
The recent History & Physical has been reviewed. I have personally examined the patient today. There is no interval change to the documented History & Physical. The patient would like to proceed with the procedure.  Garald Balding 08/01/2016,  9:39 AM

## 2016-08-01 NOTE — Progress Notes (Signed)
Orthopedic Tech Progress Note Patient Details:  [name] 10/19/1934 [id number]  CPM Left Knee CPM Left Knee: On Left Knee Flexion (Degrees): 90 Left Knee Extension (Degrees): 0   [name] 08/01/2016, 1:49 PM

## 2016-08-01 NOTE — Op Note (Signed)
PATIENT ID:      [name]  MRN:     [id number] DOB/AGE:    1934-09-30 / 81 y.o.       OPERATIVE REPORT    DATE OF PROCEDURE:  08/01/2016       PREOPERATIVE DIAGNOSIS:END STAGE   LEFT KNEE OSTEOARTHRITIS                                                       Estimated body mass index is 28.61 kg/m as calculated from the following:   Height as of this encounter: 6\' 4"  (1.93 m).   Weight as of this encounter: 235 lb (106.6 kg).     POSTOPERATIVE DIAGNOSIS: END STAGE  LEFT KNEE OSTEOARTHRITIS                                                                     Estimated body mass index is 28.61 kg/m as calculated from the following:   Height as of this encounter: 6\' 4"  (1.93 m).   Weight as of this encounter: 235 lb (106.6 kg).     PROCEDURE:  Procedure(s):LEFT TOTAL KNEE ARTHROPLASTY      SURGEON:  [name], MD    ASSISTANT:   Biagio Borg, PA-C   (Present and scrubbed throughout the case, critical for assistance with exposure, retraction, instrumentation, and closure.)          ANESTHESIA: regional and general     DRAINS: HEMOVAC DRAIN, CLAMPED IN LEFT KNEE :      TOURNIQUET TIME:  Total Tourniquet Time Documented: Thigh (Left) - 89 minutes Total: Thigh (Left) - 89 minutes     COMPLICATIONS:  None   CONDITION:  stable  PROCEDURE IN DETAIL: [id number]   [name] [place] 08/01/2016, 12:26 PM

## 2016-08-01 NOTE — Transfer of Care (Signed)
Immediate Anesthesia Transfer of Care Note  Patient: [name]  Procedure(s) Performed: Procedure(s): TOTAL KNEE ARTHROPLASTY (Left)  Patient Location: PACU  Anesthesia Type:GA combined with regional for post-op pain  Level of Consciousness: awake, alert  and oriented  Airway & Oxygen Therapy: Patient Spontanous Breathing and Patient connected to nasal cannula oxygen  Post-op Assessment: Report given to RN and Post -op Vital signs reviewed and stable  Post vital signs: Reviewed and stable  Last Vitals:  Vitals:   08/01/16 0904 08/01/16 1254  BP: (!) 167/102   Pulse: 66   Resp: 18   Temp: 36.7 C (P) 36.3 C    Last Pain:  Vitals:   08/01/16 0904  TempSrc: Oral         Complications: No apparent anesthesia complications

## 2016-08-02 ENCOUNTER — Encounter (HOSPITAL_COMMUNITY): Payer: Self-pay | Admitting: Orthopaedic Surgery

## 2016-08-02 LAB — BASIC METABOLIC PANEL
Anion gap: 7 (ref 5–15)
BUN: 13 mg/dL (ref 6–20)
CHLORIDE: 103 mmol/L (ref 101–111)
CO2: 29 mmol/L (ref 22–32)
Calcium: 8.3 mg/dL — ABNORMAL LOW (ref 8.9–10.3)
Creatinine, Ser: 0.84 mg/dL (ref 0.61–1.24)
GFR calc non Af Amer: >60 mL/min (ref >60)
Glucose, Bld: 123 mg/dL — ABNORMAL HIGH (ref 65–99)
POTASSIUM: 3.7 mmol/L (ref 3.5–5.1)
SODIUM: 139 mmol/L (ref 135–145)

## 2016-08-02 LAB — CBC
HEMATOCRIT: 38.6 % — AB (ref 39.0–52.0)
HEMOGLOBIN: 13.4 g/dL (ref 13.0–17.0)
MCH: 31.8 pg (ref 26.0–34.0)
MCHC: 34.7 g/dL (ref 30.0–36.0)
MCV: 91.5 fL (ref 78.0–100.0)
Platelets: 115 10*3/uL — ABNORMAL LOW (ref 150–400)
RBC: 4.22 MIL/uL (ref 4.22–5.81)
RDW: 13.5 % (ref 11.5–15.5)
WBC: 8.1 10*3/uL (ref 4.0–10.5)

## 2016-08-02 MED ORDER — ASPIRIN 325 MG PO TABS: 325.0000 mg | ORAL_TABLET | Freq: Two times a day (BID) | ORAL | 0 refills | Status: DC

## 2016-08-02 MED ORDER — ASPIRIN 325 MG PO TABS
325.0000 mg | ORAL_TABLET | Freq: Two times a day (BID) | ORAL | Status: DC
  Administered 2016-08-03: 325 mg via ORAL
  Filled 2016-08-02: qty 1

## 2016-08-02 MED ORDER — OXYCODONE HCL 5 MG PO TABS: 5.0000 mg | ORAL_TABLET | ORAL | 0 refills | Status: DC | PRN

## 2016-08-02 MED ORDER — METHOCARBAMOL 500 MG PO TABS: 500.0000 mg | ORAL_TABLET | Freq: Three times a day (TID) | ORAL | 0 refills | Status: DC | PRN

## 2016-08-02 MED ORDER — RIVAROXABAN 10 MG PO TABS: 10.0000 mg | ORAL_TABLET | Freq: Every day | ORAL | 0 refills | Status: DC

## 2016-08-02 MED ORDER — ASPIRIN 325 MG PO TABS: 325.0000 mg | ORAL_TABLET | Freq: Every day | ORAL | Status: DC

## 2016-08-02 NOTE — Progress Notes (Signed)
Occupational Therapy Evaluation Patient Details Name: [name] MRN: [id number] DOB: 04-16-1935 Today's Date: 08/02/2016    History of Present Illness Patient is a 81 y.o. male presented with a history of pain in the left knee for 3 years. Onset of symptoms was gradual starting a few years ago with rapidly worsening course since that time. In July of this year he apparently stepped out of his car and started and had pain in the parapatellar area. He also noted at that time he had increasing swelling more than usual. He has gotten to the point where he is having more episodes of catching, and giving way. Pt underwent a L TKA on 11/28.   Clinical Impression   PTA, pt lived at home with wife and was independent with ADL and mobility. Pt making excellent progress. Will see in am to educate on compensatory techniques with use of DME/AE to maximize functional level of independence with ADL and functional mobility and reduce risk of falls.     Follow Up Recommendations  No OT follow up;Supervision - Intermittent    Equipment Recommendations  None recommended by OT    Recommendations for Other Services       Precautions / Restrictions Precautions Precautions: Fall Restrictions Weight Bearing Restrictions: Yes LLE Weight Bearing: Partial weight bearing LLE Partial Weight Bearing Percentage or Pounds: 50%      Mobility Bed Mobility Overal bed mobility: Modified Independent             General bed mobility comments: HOB elevated; use of  bed rails  Transfers Overall transfer level: Needs assistance Equipment used: Rolling walker (2 wheeled) Transfers: Sit to/from Stand Sit to Stand: Supervision         General transfer comment: supervision for safety    Balance Overall balance assessment: Needs assistance Sitting-balance support: Bilateral upper extremity supported;Feet supported Sitting balance-Leahy Scale: Good Sitting balance - Comments: pt able to sit EOB  without support   Standing balance support: Single extremity supported;During functional activity Standing balance-Leahy Scale: Fair Standing balance comment: pt able to maintain static standing with 1 hand UE assist on RW.                            ADL Overall ADL's : Needs assistance/impaired     Grooming: Set up;Sitting   Upper Body Bathing: Set up;Sitting   Lower Body Bathing: Minimal assistance;Sit to/from stand   Upper Body Dressing : Set up;Sitting   Lower Body Dressing: Minimal assistance;Sit to/from stand   Toilet Transfer: Min guard;RW;Ambulation;BSC   Toileting- Water quality scientist and Hygiene: Sit to/from stand;Supervision/safety       Functional mobility during ADLs: Min guard;Rolling walker General ADL Comments: Difficulty reaching feet. Will benefit from AE. May need long reacher     Vision     Perception     Praxis      Pertinent Vitals/Pain Pain Assessment: 0-10 Pain Score: 4  Pain Location: L knee Pain Descriptors / Indicators: Aching;Sore Pain Intervention(s): Repositioned;Ice applied     Hand Dominance     Extremity/Trunk Assessment Upper Extremity Assessment Upper Extremity Assessment: Overall WFL for tasks assessed   Lower Extremity Assessment Lower Extremity Assessment: Defer to PT evaluation   Cervical / Trunk Assessment Cervical / Trunk Assessment: Normal   Communication Communication Communication: No difficulties   Cognition Arousal/Alertness: Awake/alert Behavior During Therapy: WFL for tasks assessed/performed Overall Cognitive Status: Within Functional Limits for tasks assessed  General Comments       Exercises       Shoulder Instructions      Home Living Family/patient expects to be discharged to:: Private residence Living Arrangements: Spouse/significant other Available Help at Discharge: Family Type of Home: House Home Access: Stairs to enter Technical brewer  of Steps: 1 Entrance Stairs-Rails: Left Home Layout: One level     Bathroom Shower/Tub: Occupational psychologist: Handicapped height Bathroom Accessibility: Yes How Accessible: Accessible via walker [place]: Grab bars - toilet;Grab bars - tub/shower;Walker - 2 wheels;Cane - single point;Shower seat - built in;Toilet riser   Additional Comments: handicapped accessible shower      Prior Functioning/Environment Level of Independence: Independent                 OT Problem List: Decreased strength;Decreased range of motion;Decreased safety awareness;Decreased knowledge of use of DME or AE;Decreased knowledge of precautions;Pain   OT Treatment/Interventions: Self-care/ADL training;DME and/or AE instruction;Therapeutic activities;Patient/family education    OT Goals(Current goals can be found in the care plan section) Acute Rehab OT Goals Patient Stated Goal: to be able to walk and go home OT Goal Formulation: With patient Time For Goal Achievement: 08/09/16 Potential to Achieve Goals: Good ADL Goals Pt Will Perform Lower Body Bathing: with set-up;with supervision;with adaptive equipment;sit to/from stand Pt Will Perform Lower Body Dressing: with set-up;with supervision;with adaptive equipment;sit to/from stand Pt Will Transfer to Toilet: with modified independence;bedside commode;ambulating Pt Will Perform Tub/Shower Transfer: with set-up;with supervision;ambulating;rolling walker;3 in 1 (simulated)  OT Frequency: Min 2X/week   Barriers to D/C:            Co-evaluation              End of Session Equipment Utilized During Treatment: Gait belt;Rolling walker CPM Left Knee CPM Left Knee: Off Nurse Communication: Mobility status  Activity Tolerance: Patient tolerated treatment well Patient left: in chair;with call bell/phone within reach;with family/visitor present   Time: 1501-1530 OT Time Calculation (min): 29 min Charges:  OT General  Charges $OT Visit: 1 Procedure OT Evaluation $OT Eval Moderate Complexity: 1 Procedure OT Treatments $Self Care/Home Management : 8-22 mins G-Codes:    Amal Renbarger,HILLARY Aug 07, 2016, 3:41 PM  [facility], OTR/L  [phone] Aug 07, 2016

## 2016-08-02 NOTE — Discharge Summary (Signed)
[name], MD   Biagio Borg, PA-C [address], [place], [place]  [address]                             [phone]  PATIENT ID: [name]        MRN:  [id number]          DOB/AGE: 02/27/1935 / 81 y.o.    DISCHARGE SUMMARY  ADMISSION DATE:    08/01/2016 DISCHARGE DATE:   08/03/2016   ADMISSION DIAGNOSIS: LEFT KNEE OSTEOARTHRITIS    DISCHARGE DIAGNOSIS:  LEFT KNEE OSTEOARTHRITIS    ADDITIONAL DIAGNOSIS: Active Problems:   Unilateral primary osteoarthritis, left knee   S/P total knee replacement using cement, left  Past Medical History:  Diagnosis Date  . Arthritis   . Asthma    only if develops cold  . Dysrhythmia    h/o pvc's  . GERD (gastroesophageal reflux disease)   . Kidney stone on left side   . PVC (premature ventricular contraction)   . Recurrent upper respiratory infection (URI) 03/2011    PROCEDURE: Procedure(s): TOTAL KNEE ARTHROPLASTY Left on 08/01/2016  CONSULTS: none    HISTORY: Patient is a 81 y.o.malepresented with a history of pain in the leftknee for 3 years. Onset of symptoms was gradual starting a fewyearsagowith rapidly worseningcourse since that time.In July of this year he apparently stepped out of his car and started and had pain in the parapatellar area. He also noted at that time he had increasing swelling more than usual. Never now most recently he has gotten to the point where he is having more episodes of catching, and giving way. He can now palpate a mass in the superior patellar pouch.Prior procedures on the knee are corticosteroid injections.. Patient has been treated conservatively with over-the-counter NSAIDs and activity modification. Patient currently rates pain in the knee at 7out of 10 with activity. There is pain at night.present.  HOSPITAL COURSE:  AJA GATTS is a 81 y.o. admitted on 08/01/2016 and found to have a diagnosis of LEFT KNEE OSTEOARTHRITIS.  After appropriate laboratory studies were  obtained  they were taken to the operating room on 08/01/2016 and underwent  Procedure(s): TOTAL KNEE ARTHROPLASTY  Left.   They were given perioperative antibiotics:  Anti-infectives    Start     Dose/Rate Route Frequency Ordered Stop   08/01/16 1830  ceFAZolin (ANCEF) IVPB 2g/100 mL premix     2 g 200 mL/hr over 30 Minutes Intravenous Every 6 hours 08/01/16 1822 08/02/16 0629   08/01/16 0600  ceFAZolin (ANCEF) 3 g in dextrose 5 % 50 mL IVPB     3 g 130 mL/hr over 30 Minutes Intravenous On call to O.R. 07/31/16 1323 08/01/16 1027    .  Tolerated the procedure well. Toradol was given post op.  POD #1, allowed out of bed to a chair.  PT for ambulation and exercise program.  IV saline locked.  O2 discontionued.  POD #2, continued PT and ambulation.   Hemovac pulled. .  The remainder of the hospital course was dedicated to ambulation and strengthening.   The patient was discharged on 2 Day Post-Op in  Stable condition.  Blood products given:none  DIAGNOSTIC STUDIES: Recent vital signs:  Patient Vitals for the past 24 hrs:  BP Temp Temp src Pulse Resp SpO2  08/02/16 1249 - - - - - 98 %  08/02/16 0400 (!) 102/59 97.5 F (36.4 C) Oral  60 17 95 %  08/02/16 0100 (!) 108/58 97.4 F (36.3 C) Oral (!) 54 17 93 %  08/01/16 2058 125/61 97.5 F (36.4 C) Oral (!) 51 17 94 %  08/01/16 1747 118/64 97.2 F (36.2 C) - (!) 57 18 96 %  08/01/16 1723 - 97.2 F (36.2 C) - (!) 50 17 97 %  08/01/16 1700 - - - (!) 47 18 95 %  08/01/16 1615 - - - (!) 46 18 95 %  08/01/16 1600 - - - (!) 56 13 97 %  08/01/16 1545 - - - - 18 -  08/01/16 1515 - - - (!) 51 20 97 %  08/01/16 1500 - 97 F (36.1 C) - (!) 44 15 96 %  08/01/16 1445 - - - (!) 46 14 99 %  08/01/16 1439 - - - (!) 48 13 97 %  08/01/16 1430 - - - (!) 51 20 95 %       Recent laboratory studies:  Recent Labs  08/02/16 0435  WBC 8.1  HGB 13.4  HCT 38.6*  PLT 115*    Recent Labs  08/02/16 0435  NA 139  K 3.7  CL 103  CO2 29    BUN 13  CREATININE 0.84  GLUCOSE 123*  CALCIUM 8.3*   Lab Results  Component Value Date   INR 1.09 07/20/2016   INR 1.08 03/20/2014   INR 1.00 07/12/2011     Recent Radiographic Studies :  Dg Chest 2 View  Result Date: 07/20/2016 CLINICAL DATA:  Preop chest radiograph for left knee arthroplasty EXAM: CHEST  2 VIEW COMPARISON:  04/02/2014 FINDINGS: Lungs are clear.  No pleural effusion or pneumothorax. The heart is top-normal in size. Degenerative changes of the visualized thoracolumbar spine. Cholecystectomy clips. IMPRESSION: No evidence of acute cardiopulmonary disease. Electronically Signed   By: Julian Hy M.D.   On: 07/20/2016 10:57    DISCHARGE INSTRUCTIONS: Discharge Instructions    CPM    Complete by:  As directed    Continuous passive motion machine (CPM):      Use the CPM from 0 to 60 degrees for 6-8 hours per day.      You may increase by 5-10 per day.  You may break it up into 2 or 3 sessions per day.      Use CPM for 3-4 weeks or until you are told to stop.   Call MD / Call 911    Complete by:  As directed    If you experience chest pain or shortness of breath, CALL 911 and be transported to the hospital emergency room.  If you develope a fever above 101 F, pus (white drainage) or increased drainage or redness at the wound, or calf pain, call your surgeon's office.   Change dressing    Complete by:  As directed    DO NOT CHANGE YOUR DRESSING.   Constipation Prevention    Complete by:  As directed    Drink plenty of fluids.  Prune juice may be helpful.  You may use a stool softener, such as Colace (over the counter) 100 mg twice a day.  Use MiraLax (over the counter) for constipation as needed.   Diet general    Complete by:  As directed    Discharge instructions    Complete by:  As directed    [place] items at home which could result in a fall. This includes throw rugs or furniture  in walking pathways ICE to the  affected joint every three hours while awake for 30 minutes at a time, for at least the first 3-5 days, and then as needed for pain and swelling.  Continue to use ice for pain and swelling. You may notice swelling that will progress down to the foot and ankle.  This is normal after surgery.  Elevate your leg when you are not up walking on it.   Continue to use the breathing machine you got in the hospital (incentive spirometer) which will help keep your temperature down.  It is common for your temperature to cycle up and down following surgery, especially at night when you are not up moving around and exerting yourself.  The breathing machine keeps your lungs expanded and your temperature down.   DIET:  As you were doing prior to hospitalization, we recommend a well-balanced diet.  DRESSING / WOUND CARE / SHOWERING  Keep the surgical dressing until follow up.  The dressing is water proof, so you can shower without any extra covering.  IF THE DRESSING FALLS OFF or the wound gets wet inside, change the dressing with sterile gauze.  Please use good hand washing techniques before changing the dressing.  Do not use any lotions or creams on the incision until instructed by your surgeon.    ACTIVITY  Increase activity slowly as tolerated, but follow the weight bearing instructions below.   No driving for 6 weeks or until further direction given by your physician.  You cannot drive while taking narcotics.  No lifting or carrying greater than 10 lbs. until further directed by your surgeon. Avoid periods of inactivity such as sitting longer than an hour when not asleep. This helps prevent blood clots.  You may return to work once you are authorized by your doctor.     WEIGHT BEARING   Partial weight bearing with assist device as directed.  50%   EXERCISES  Results after joint replacement surgery are often greatly improved when you follow the exercise, range of motion and muscle strengthening  exercises prescribed by your doctor. Safety measures are also important to protect the joint from further injury. Any time any of these exercises cause you to have increased pain or swelling, decrease what you are doing until you are comfortable again and then slowly increase them. If you have problems or questions, call your caregiver or physical therapist for advice.   Rehabilitation is important following a joint replacement. After just a few days of immobilization, the muscles of the leg can become weakened and shrink (atrophy).  These exercises are designed to build up the tone and strength of the thigh and leg muscles and to improve motion. Often times heat used for twenty to thirty minutes before working out will loosen up your tissues and help with improving the range of motion but do not use heat for the first two weeks following surgery (sometimes heat can increase post-operative swelling).   These exercises can be done on a training (exercise) mat,  on a table or on a bed. Use whatever works the best and is most comfortable for you.    Use music or television while you are exercising so that the exercises are a pleasant break in your day. This will make your life better with the exercises acting as a break in your routine that you can look forward to.   Perform all exercises about fifteen times, three times per day or as directed.  You should  exercise both the operative leg and the other leg as well.   Exercises include:  Quad Sets - Tighten up the muscle on the front of the thigh (Quad) and hold for 5-10 seconds.   Straight Leg Raises - With your knee straight (if you were given a brace, keep it on), lift the leg to 60 degrees, hold for 3 seconds, and slowly lower the leg.  Perform this exercise against resistance later as your leg gets stronger.  Leg Slides: Lying on your back, slowly slide your foot toward your buttocks, bending your knee up off the floor (only go as far as is comfortable).  Then slowly slide your foot back down until your leg is flat on the floor again.  Angel Wings: Lying on your back spread your legs to the side as far apart as you can without causing discomfort.  Hamstring Strength:  Lying on your back, push your heel against the floor with your leg straight by tightening up the muscles of your buttocks.  Repeat, but this time bend your knee to a comfortable angle, and push your heel against the floor.  You may put a pillow under the heel to make it more comfortable if necessary.   A rehabilitation program following joint replacement surgery can speed recovery and prevent re-injury in the future due to weakened muscles. Contact your doctor or a physical therapist for more information on knee rehabilitation.    CONSTIPATION  Constipation is defined medically as fewer than three stools per week and severe constipation as less than one stool per week.  Even if you have a regular bowel pattern at home, your normal regimen is likely to be disrupted due to multiple reasons following surgery.  Combination of anesthesia, postoperative narcotics, change in appetite and fluid intake all can affect your bowels.   YOU MUST use at least one of the following options; they are listed in order of increasing strength to get the job done.  They are all available over the counter, and you may need to use some, POSSIBLY even all of these options:    Drink plenty of fluids (prune juice may be helpful) and high fiber foods Colace 100 mg by mouth twice a day  Senokot for constipation as directed and as needed Dulcolax (bisacodyl), take with full glass of water  Miralax (polyethylene glycol) once or twice a day as needed.  If you have tried all these things and are unable to have a bowel movement in the first 3-4 days after surgery call either your surgeon or your primary doctor.    If you experience loose stools or diarrhea, hold the medications until you stool forms back up.  If your  symptoms do not get better within 1 week or if they get worse, check with your doctor.  If you experience "the worst abdominal pain ever" or develop nausea or vomiting, please contact the office immediately for further recommendations for treatment.   ITCHING:  If you experience itching with your medications, try taking only a single pain pill, or even half a pain pill at a time.  You can also use Benadryl over the counter for itching or also to help with sleep.   TED HOSE STOCKINGS:  Use stockings on both legs until for at least 2 weeks or as directed by physician office. They may be removed at night for sleeping.  MEDICATIONS:  See your medication summary on the "After Visit Summary" that nursing will review with you.  You  may have some home medications which will be placed on hold until you complete the course of blood thinner medication.  It is important for you to complete the blood thinner medication as prescribed.  PRECAUTIONS:  If you experience chest pain or shortness of breath - call 911 immediately for transfer to the hospital emergency department.   If you develop a fever greater that 101 F, purulent drainage from wound, increased redness or drainage from wound, foul odor from the wound/dressing, or calf pain - CONTACT YOUR SURGEON.                                                   FOLLOW-UP APPOINTMENTS:  If you do not already have a post-op appointment, please call the office for an appointment to be seen by your surgeon.  Guidelines for how soon to be seen are listed in your "After Visit Summary", but are typically between 1-4 weeks after surgery.  OTHER INSTRUCTIONS:   Knee Replacement:  Do not place pillow under knee, focus on keeping the knee straight while resting. CPM instructions: 0-90 degrees, 2 hours in the morning, 2 hours in the afternoon, and 2 hours in the evening. Place foam block, curve side up under heel at all times except when in CPM or when walking.  DO NOT modify,  tear, cut, or change the foam block in any way.  MAKE SURE YOU:  Understand these instructions.  Get help right away if you are not doing well or get worse.    Thank you for letting us be a part of your medical care team.  It is a privilege we respect greatly.  We hope these instructions will help you stay on track for a fast and full recovery!   Do not put a pillow under the knee. Place it under the heel.    Complete by:  As directed    Driving restrictions    Complete by:  As directed    No driving for 6 weeks   Increase activity slowly as tolerated    Complete by:  As directed    Lifting restrictions    Complete by:  As directed    No lifting for 6 weeks   Partial weight bearing    Complete by:  As directed    % Body Weight:  50%   Laterality:  left   Extremity:  Lower   Patient may shower    Complete by:  As directed    You may shower over your dressing   TED hose    Complete by:  As directed    Use stockings (TED hose) for 3 weeks on left  leg.  You may remove them at night for sleeping.      DISCHARGE MEDICATIONS:     Medication List    STOP taking these medications   ibuprofen 200 MG tablet Commonly known as:  ADVIL,MOTRIN     TAKE these medications   albuterol 108 (90 Base) MCG/ACT inhaler Commonly known as:  PROVENTIL HFA;VENTOLIN HFA Inhale 2 puffs into the lungs every 6 (six) hours as needed for wheezing or shortness of breath.   aspirin 325 MG tablet Take 1 tablet (325 mg total) by mouth 2 (two) times daily. Start taking on:  08/03/2016 What changed:  medication strength  how much to take  when  to take this   methocarbamol 500 MG tablet Commonly known as:  ROBAXIN Take 1 tablet (500 mg total) by mouth every 8 (eight) hours as needed for muscle spasms.   metoprolol tartrate 25 MG tablet Commonly known as:  LOPRESSOR Take 1 tablet (25 mg total) by mouth daily.   oxyCODONE 5 MG immediate release tablet Commonly known as:  Oxy  IR/ROXICODONE Take 1-2 tablets (5-10 mg total) by mouth every 3 (three) hours as needed for breakthrough pain.   pantoprazole 40 MG tablet Commonly known as:  PROTONIX Take 1 tablet (40 mg total) by mouth daily.   potassium chloride SA 20 MEQ tablet Commonly known as:  K-DUR,KLOR-CON Take 1 tablet (20 mEq total) by mouth daily.   rivaroxaban 10 MG Tabs tablet Commonly known as:  XARELTO Take 1 tablet (10 mg total) by mouth daily with breakfast.   vitamin C 500 MG tablet Commonly known as:  ASCORBIC ACID Take 1 tablet (500 mg total) by mouth daily.            Durable Medical Equipment        Start     Ordered   08/01/16 1823  DME Walker rolling  Once    Question:  Patient needs a walker to treat with the following condition  Answer:  S/P TKR (total knee replacement) using cement, left   08/01/16 1822   08/01/16 1823  DME 3 n 1  Once     08/01/16 1822   08/01/16 1823  DME Bedside commode  Once    Question:  Patient needs a bedside commode to treat with the following condition  Answer:  S/P TKR (total knee replacement) using cement, left   08/01/16 1822      FOLLOW UP VISIT:   Follow-up Information    Garald Balding, MD Follow up on 08/14/2016.   Specialty:  Orthopedic Surgery Contact information: [address] [place] [address] [phone]           DISPOSITION:   Home  CONDITION:  Stable   Mike Craze. [place], [place] [phone]  08/02/2016 2:06 PM

## 2016-08-02 NOTE — Care Management Note (Signed)
Case Management Note  Patient Details  Name: [name] MRN: [id number] Date of Birth: 1935/07/25  Subjective/Objective:   81 yr old gentleman s/p left total knee arthroplasty.                 Action/Plan: Case manager spoke with patient and his wife concerning Home health and DME needs. Patient was preoperatively setup with [facility], no changes. He has rolling walker and 3in1 from previous surgery. Patient will have family support at discharge.    Expected Discharge Date:    08/03/16              Expected Discharge Plan:  [place]  In-House Referral:     Discharge planning Services  CM Consult  Post Acute Care Choice:  Home Health, Durable Medical Equipment Choice offered to:  Patient, Spouse  DME Arranged:  CPM DME Agency:  [facility]  HH Arranged:  PT [place] Agency:  [facility] (now [facility])  Status of Service:  Completed, signed off  If discussed at [place] of Stay Meetings, dates discussed:    Additional Comments:  Ninfa Meeker, RN 08/02/2016, 3:38 PM

## 2016-08-02 NOTE — Progress Notes (Signed)
Orthopedic Tech Progress Note Patient Details:  [name] 1934/10/26 [id number]  Patient ID: [name], male   DOB: 27-Nov-1934, 81 y.o.   MRN: [id number] Applied cpm 0-60  Karolee Stamps 08/02/2016, 5:37 AM

## 2016-08-02 NOTE — Progress Notes (Signed)
Orthopedic Tech Progress Note Patient Details:  [name] 1935-01-28 [id number] Put on cpm at 1815 Patient ID: [name], male   DOB: 1935-04-15, 81 y.o.   MRN: [id number]   Braulio Bosch 08/02/2016, 6:16 PM

## 2016-08-02 NOTE — Progress Notes (Signed)
Physical Therapy Evaluation Patient Details Name: [name] MRN: [id number] DOB: 22-Dec-1934 Today's Date: 08/02/2016   History of Present Illness  Patient is a 81 y.o. male presented with a history of pain in the left knee for 3 years. Onset of symptoms was gradual starting a few years ago with rapidly worsening course since that time. In July of this year he apparently stepped out of his car and started and had pain in the parapatellar area. He also noted at that time he had increasing swelling more than usual. He has gotten to the point where he is having more episodes of catching, and giving way. Pt underwent a L TKA on 11/28.  Clinical Impression  PTA, pt was independently ambulating around his home without an assistive device. Pt currently lives with his wife, who suffered a stroke few years ago. Pt currently demonstrates L knee buckling with stance phase of gait on LLE. This puts the pt at increased risk of falls. However, pt has good UE strength and safety awareness. Knee buckling may possibly be 2/2 continued effects of nerve block- will continue to monitor during sessions. Pt is a good candidate for home health PT (pending improvement of knee buckling during ambulation) to maximize strength and balance gains. PT will continue to follow acutely.     Follow Up Recommendations Home health PT    Equipment Recommendations  None recommended by PT    Recommendations for Other Services       Precautions / Restrictions Restrictions Weight Bearing Restrictions: Yes LLE Weight Bearing: Partial weight bearing LLE Partial Weight Bearing Percentage or Pounds: 50%      Mobility  Bed Mobility Overal bed mobility: Modified Independent             General bed mobility comments: HOB elevated; use of  bed rails  Transfers Overall transfer level: Needs assistance Equipment used: Rolling walker (2 wheeled) Transfers: Sit to/from Stand Sit to Stand: Min guard         General  transfer comment: min guard for safety; pt noted slight dizziness upon standing  Ambulation/Gait Ambulation/Gait assistance: Min assist Ambulation Distance (Feet): 75 Feet Assistive device: Rolling walker (2 wheeled) Gait Pattern/deviations: Step-through pattern;Decreased stride length;Decreased weight shift to left;Antalgic Gait velocity: decreased Gait velocity interpretation: Below normal speed for age/gender General Gait Details: pt's L knee buckles during stance phase of gait; pt is able to support himself with B UE when putting weight through LLE; min assist for balance; pt able to maintain PWB status  Stairs            Wheelchair Mobility    Modified Rankin (Stroke Patients Only)       Balance Overall balance assessment: Needs assistance Sitting-balance support: Feet unsupported;No upper extremity supported Sitting balance-Leahy Scale: Good Sitting balance - Comments: pt able to sit EOB without support   Standing balance support: During functional activity;Single extremity supported Standing balance-Leahy Scale: Poor Standing balance comment: Pt able to maintain static standing with 1 hand assist on RW but requires B UE for dynamic activities                             Pertinent Vitals/Pain Pain Assessment: 0-10 Pain Score: 5  Pain Location: L knee Pain Descriptors / Indicators: Aching Pain Intervention(s): Monitored during session;Limited activity within patient's tolerance;Repositioned    Home Living Family/patient expects to be discharged to:: Private residence Living Arrangements: Spouse/significant other Available Help at  Discharge: Family (grandson is available intermittently) Type of Home: House Home Access: Stairs to enter Entrance Stairs-Rails: Left Entrance Stairs-Number of Steps: 1 Home Layout: One level Home Equipment: Grab bars - toilet;Grab bars - tub/shower;Walker - 2 wheels;Cane - single point;Shower seat - built in;Toilet  riser      Prior Function Level of Independence: Independent               Hand Dominance   Dominant Hand: Right    Extremity/Trunk Assessment   Upper Extremity Assessment: Overall WFL for tasks assessed           Lower Extremity Assessment: Overall WFL for tasks assessed;LLE deficits/detail   LLE Deficits / Details: Knee buckling during ambulation     Communication   Communication: No difficulties  Cognition Arousal/Alertness: Awake/alert Behavior During Therapy: WFL for tasks assessed/performed Overall Cognitive Status: Within Functional Limits for tasks assessed                      General Comments      Exercises Total Joint Exercises Ankle Circles/Pumps: AROM;10 reps;Seated;Both Quad Sets: AROM;Left;10 reps;Seated Heel Slides: AROM;Left;10 reps;Seated Goniometric ROM: 33 to 75   Assessment/Plan    PT Assessment Patient needs continued PT services  PT Problem List Decreased range of motion;Decreased strength;Decreased activity tolerance;Decreased balance;Decreased mobility          PT Treatment Interventions DME instruction;Gait training;Stair training;Functional mobility training;Therapeutic activities;Therapeutic exercise;Balance training;Patient/family education    PT Goals (Current goals can be found in the Care Plan section)  Acute Rehab PT Goals Patient Stated Goal: to be able to walk and go home PT Goal Formulation: With patient Time For Goal Achievement: 08/16/16 Potential to Achieve Goals: Good    Frequency BID   Barriers to discharge        Co-evaluation               End of Session Equipment Utilized During Treatment: Gait belt Activity Tolerance: Patient tolerated treatment well Patient left: in chair;with call bell/phone within reach;with family/visitor present Nurse Communication: Mobility status         Time: [id number] PT Time Calculation (min) (ACUTE ONLY): 29 min   Charges:   PT Evaluation $PT  Eval Moderate Complexity: 1 Procedure PT Treatments $Gait Training: 8-22 mins   PT G Codes:        Tonia Brooms 2016/09/01, 11:28 AM Tonia Brooms, SPT [phone]

## 2016-08-02 NOTE — Progress Notes (Signed)
08/02/16 1249  PT Visit Information  Last PT Received On 08/02/16  Assistance Needed +1  History of Present Illness Patient is a 81 y.o. male presented with a history of pain in the left knee for 3 years. Onset of symptoms was gradual starting a few years ago with rapidly worsening course since that time. In July of this year he apparently stepped out of his car and started and had pain in the parapatellar area. He also noted at that time he had increasing swelling more than usual. He has gotten to the point where he is having more episodes of catching, and giving way. Pt underwent a L TKA on 11/28.  Subjective Data  Patient Stated Goal to be able to walk and go home  Precautions  Precautions Fall  Restrictions  Weight Bearing Restrictions Yes  LLE Weight Bearing PWB  LLE Partial Weight Bearing Percentage or Pounds 50%  Pain Assessment  Pain Assessment 0-10  Pain Score 5  Pain Location L knee  Pain Descriptors / Indicators Aching  Pain Intervention(s) Repositioned;Monitored during session;Limited activity within patient's tolerance  Cognition  Arousal/Alertness Awake/alert  Behavior During Therapy WFL for tasks assessed/performed  Overall Cognitive Status Within Functional Limits for tasks assessed  Bed Mobility  Overal bed mobility Modified Independent  General bed mobility comments HOB elevated; use of  bed rails  Transfers  Overall transfer level Needs assistance  Equipment used Rolling walker (2 wheeled)  Transfers Sit to/from Stand  Sit to Stand Supervision  General transfer comment supervision for safety  Ambulation/Gait  Ambulation/Gait assistance Min guard  Ambulation Distance (Feet) 150 Feet  Assistive device Rolling walker (2 wheeled)  Gait Pattern/deviations Step-through pattern;Antalgic  General Gait Details no knee buckling was noted by PT or pt. Pt able to ambulate with increased stride length and trunk upright.  Gait velocity decreased  Gait velocity  interpretation Below normal speed for age/gender  Stairs Yes  Stairs assistance Min guard  Stair Management One rail Left;Step to pattern;Forwards  Number of Stairs 1  General stair comments Pt able to navigate stairs with min guard for safety  Balance  Overall balance assessment Needs assistance  Sitting-balance support Bilateral upper extremity supported;Feet supported  Sitting balance-Leahy Scale Good  Sitting balance - Comments pt able to sit EOB without support  Standing balance support Single extremity supported;During functional activity  Standing balance-Leahy Scale Fair  Standing balance comment pt able to maintain static standing with 1 hand UE assist on RW.  PT - End of Session  Equipment Utilized During Treatment Gait belt  Activity Tolerance Patient tolerated treatment well  Patient left with call bell/phone within reach;with family/visitor present;in bed  Nurse Communication Mobility status  CPM Left Knee  CPM Left Knee Off (pt eating lunch)  PT - Assessment/Plan  PT Plan Frequency needs to be updated  PT Frequency (ACUTE ONLY) 7X/week  Follow Up Recommendations Home health PT  PT equipment None recommended by PT  PT Goal Progression  Progress towards PT goals Progressing toward goals  Acute Rehab PT Goals  PT Goal Formulation With patient  Time For Goal Achievement 08/16/16  Potential to Achieve Goals Good  PT Time Calculation  PT Start Time (ACUTE ONLY) 1207  PT Stop Time (ACUTE ONLY) 1239  PT Time Calculation (min) (ACUTE ONLY) 32 min  PT General Charges  $$ ACUTE PT VISIT 1 Procedure  PT Treatments  $Gait Training 8-22 mins  $Therapeutic Activity 8-22 mins   Pt demonstrated improved gait with  no instances or reports of knee buckling with weight bearing on L LE. Pt continues to demonstrate good understanding of PWB status and was able to ambulate increased distance this session. Pt continues to be a good candidate for home health to maximize strength and  balance gains. PT will continue to follow acutely.  Tonia Brooms, SPT [phone]

## 2016-08-02 NOTE — Progress Notes (Signed)
Orthopedic Tech Progress Note Patient Details:  [name] Aug 03, 1935 [id number]  Patient ID: [name], male   DOB: 1935-08-19, 81 y.o.   MRN: [id number]   Hildred Priest 08/02/2016, 1:25 PM Placed pt's lle on cpm@ 0-70 degrees @1325 ; RN notified

## 2016-08-02 NOTE — Op Note (Signed)
PATIENT ID: [name]        MRN:  [id number]          DOB/AGE: August 19, 1935 / 81 y.o.    [name], MD   Biagio Borg, PA-C [address] [place], [place]  [address]                             [phone]   PROGRESS NOTE  Subjective:  negative for Chest Pain  negative for Shortness of Breath  negative for Nausea/Vomiting   negative for Calf Pain    Tolerating Diet: yes         Patient reports pain as mild.     Comfortable night  Objective: Vital signs in last 24 hours:   Patient Vitals for the past 24 hrs:  BP Temp Temp src Pulse Resp SpO2 Height Weight  08/02/16 0400 (!) 102/59 97.5 F (36.4 C) Oral 60 17 95 % - -  08/02/16 0100 (!) 108/58 97.4 F (36.3 C) Oral (!) 54 17 93 % - -  08/01/16 2058 125/61 97.5 F (36.4 C) Oral (!) 51 17 94 % - -  08/01/16 1747 118/64 97.2 F (36.2 C) - (!) 57 18 96 % - -  08/01/16 1723 - 97.2 F (36.2 C) - (!) 50 17 97 % - -  08/01/16 1700 - - - (!) 47 18 95 % - -  08/01/16 1615 - - - (!) 46 18 95 % - -  08/01/16 1600 - - - (!) 56 13 97 % - -  08/01/16 1545 - - - - 18 - - -  08/01/16 1515 - - - (!) 51 20 97 % - -  08/01/16 1500 - 97 F (36.1 C) - (!) 44 15 96 % - -  08/01/16 1445 - - - (!) 46 14 99 % - -  08/01/16 1439 - - - (!) 48 13 97 % - -  08/01/16 1430 - - - (!) 51 20 95 % - -  08/01/16 1400 - - - (!) 51 11 97 % - -  08/01/16 1356 130/70 - - (!) 50 13 96 % - -  08/01/16 1345 - - - (!) 51 15 96 % - -  08/01/16 1341 121/84 - - (!) 51 13 96 % - -  08/01/16 1330 - - - (!) 49 18 91 % - -  08/01/16 1322 - - - (!) 57 13 96 % - -  08/01/16 1311 (!) 149/80 - - 62 18 95 % - -  08/01/16 1309 - - - (!) 56 15 96 % - -  08/01/16 1254 (!) 170/101 97.3 F (36.3 C) - 72 17 95 % - -  08/01/16 0935 - - - - - - 6\' 4"  (1.93 m) 235 lb (106.6 kg)  08/01/16 0904 (!) 167/102 98 F (36.7 C) Oral 66 18 97 % - 237 lb (107.5 kg)      Intake/Output from previous day:   11/28 0701 - 11/29 0700 In: 2441.7 [I.V.:2041.7] Out: 1470  [Urine:800; Drains:620]   Intake/Output this shift:   No intake/output data recorded.   Intake/Output      11/28 0701 - 11/29 0700 11/29 0701 - 11/30 0700   I.V. (mL/kg) 2041.7 (19.2)    IV Piggyback 400    Total Intake(mL/kg) 2441.7 (22.9)    Urine (mL/kg/hr) 800    Drains 620    Blood 50  Total Output 1470     Net +971.7             LABORATORY DATA:  Recent Labs  08/02/16 0435  WBC 8.1  HGB 13.4  HCT 38.6*  PLT 115*    Recent Labs  08/02/16 0435  NA 139  K 3.7  CL 103  CO2 29  BUN 13  CREATININE 0.84  GLUCOSE 123*  CALCIUM 8.3*   Lab Results  Component Value Date   INR 1.09 07/20/2016   INR 1.08 03/20/2014   INR 1.00 07/12/2011    Recent Radiographic Studies :  Dg Chest 2 View  Result Date: 07/20/2016 CLINICAL DATA:  Preop chest radiograph for left knee arthroplasty EXAM: CHEST  2 VIEW COMPARISON:  04/02/2014 FINDINGS: Lungs are clear.  No pleural effusion or pneumothorax. The heart is top-normal in size. Degenerative changes of the visualized thoracolumbar spine. Cholecystectomy clips. IMPRESSION: No evidence of acute cardiopulmonary disease. Electronically Signed   By: Julian Hy M.D.   On: 07/20/2016 10:57     Examination:  General appearance: alert, cooperative and no distress  Wound Exam: clean, dry, intact   Drainage:  Moderate amount Serosanguinous exudate in hemovac  Motor Exam: EHL and FHL Intact  Sensory Exam: Superficial Peroneal, Deep Peroneal and Tibial normal  Vascular Exam: Normal  Assessment:    1 Day Post-Op  Procedure(s) (LRB): TOTAL KNEE ARTHROPLASTY (Left)  ADDITIONAL DIAGNOSIS:  Active Problems:   Unilateral primary osteoarthritis, left knee   S/P total knee replacement using cement, left  no new problems   Plan: Physical Therapy as ordered   DVT Prophylaxis:  Partial Weight Bearing @ 50% (PWB)  DISCHARGE PLAN: Home  DISCHARGE NEEDS: HHPT, CPM, Walker and 3-in-1 comode seat   OOB with PT, saline  lock IV, hope for D/C tomorrow     Garald Balding  08/02/2016 8:12 AM

## 2016-08-02 NOTE — Op Note (Signed)
[name], [name] NO.:  [id number]  MEDICAL RECORD NO.:  [id number]  LOCATION:                                 FACILITY:  PHYSICIAN:  Vonna Kotyk. Cobe Viney, M.D.DATE OF BIRTH:  11-26-1934  DATE OF PROCEDURE:  08/01/2016 DATE OF DISCHARGE:                              OPERATIVE REPORT   PREOPERATIVE DIAGNOSIS:  End-stage osteoarthritis, left knee.  POSTOPERATIVE DIAGNOSIS:  End-stage osteoarthritis, left knee.  PROCEDURE:  Left total knee replacement.  SURGEON:  Vonna Kotyk. Durward Fortes, M.D.  ASSISTANT:  Biagio Borg, PA-C, was present throughout the operative procedure to ensure its timely completion.  ANESTHESIA:  General with supplemental adductor canal block.  COMPLICATIONS:  None.  COMPONENTS:  DePuy LCS large femoral component, a #5 rotating keeled tibial tray with a 10-mm polyethylene bridging bearing and metal-backed 3-peg rotating patella.  Components were secured with polymethyl methacrylate.  DESCRIPTION OF PROCEDURE:  [name] was met with his wife in the holding area, identified the left knee as the appropriate operative site and marked it accordingly.  Anesthesia performed an adductor canal block.  Any questions were answered.  The patient was then transported to room #7 and placed under general anesthesia without difficulty.  A time-out was called.  The left lower extremity was placed in a thigh tourniquet.  The leg was then prepped with chlorhexidine scrub and DuraPrep x2 from the tourniquet to the tips of the toes.  Sterile draping was performed.  Time-out was called again.  The left lower extremity was then elevated, Esmarch exsanguinated with a proximal tourniquet at 350 mmHg.  A routine Southern incision was utilized and via sharp dissection, carried down to subcutaneous tissue.  First, a layer of capsule was incised in the midline.  A medial parapatellar incision was then made through the deep capsule.  The joint was entered.   There was about a 15 mL clear yellow joint effusion.  The patella was everted to 180 degrees laterally and the knee flexed to 90 degrees.  There was a fixed varus position so I performed a medial release along the tibia subperiosteally.  There was considerable deformity with large areas of complete articular cartilage loss in all 3 compartments with eburnated bone along the medial femoral condyle, medial tibial plateau.  There were large osteophytes along the medial femoral condyle and tibial femoral condyle, medial tibial plateau and patella, these were removed and the synovectomy was performed.  I measured a large femoral component.  First, bony cut was then made transversely on the proximal tibia using the external tibial guide in a 7-degree angle of declination. Subsequent cuts were then made on the femur using large femoral jigs. Flexion and extension gaps were perfectly symmetrical at 10 mm.  Lamina spreaders were then placed along the medial and lateral compartments.  I removed medial and lateral menisci, ACL and PCL.  There were several large loose bodies two of which were in the superior pouch, one in the popliteal area.  Osteotome was used to remove the osteophytes and posterior femoral condyles.  The MCL and LCL remained intact.  With each bony cut on the tibia and femur, I checked external alignment jig with  excellent realignment of the varus position.  The retractors were then placed around the tibia and was advanced anteriorly.  We measured a #5 tibial tray.  This was pinned in place. Center hole was made followed by the keeled cut.  With the tibial trial jig in place, a 10-mm polyethylene bridging bearing was inserted followed by the large trial femoral component.  This was reduced and through a full range of motion it remained perfectly stable without any malrotation of the tibial component, no opening with the varus or valgus stress.  The patella was prepared by  removing approximately 10 mm of bone leaving 13 mm of patellar thickness.  The patellar jig was then applied, the 3 holes made, and trial patella inserted and reduced.  The full range of motion was stable.  Trial components were removed.  The joint was copiously irrigated with jet saline solution.  The final components were then impacted with polymethyl methacrylate. We initially applied the tibial tray followed by the 10-mm polyethylene bridging bearing and then the large femoral component.  Extraneous methacrylate was removed from the periphery of the components.  Patella was applied with methacrylate and patellar clamp.  While waiting for the methacrylate to mature, we injected the deep capsule with 0.25% Marcaine with epinephrine.  At approximately 16 minutes the methacrylate had matured, we checked the joint, there were several small areas of extraneous methacrylate which were removed with an osteotome.  Tourniquet was deflated.  Gross bleeders were Bovie coagulated. Tranexamic acid was applied topically into the joint.  We thought we had very nice hemostasis.  Medium-size Hemovac was inserted through the lateral capsule.  The deep capsule was then closed with a running 0 Ethibond, superficial capsule with a running 0 Vicryl, and the subcu with 3-0 Monocryl.  Skin closed with skin clips.  Sterile bulky dressing was applied, followed by the patient's support stocking.  The patient tolerated the procedure without complications.     Vonna Kotyk. Durward Fortes, M.D.   ______________________________ Vonna Kotyk. Durward Fortes, M.D.   PWW/MEDQ  D:  08/01/2016  T:  08/02/2016  Job:  [id number]

## 2016-08-03 LAB — CBC
HCT: 35.2 % — ABNORMAL LOW (ref 39.0–52.0)
HEMOGLOBIN: 11.8 g/dL — AB (ref 13.0–17.0)
MCH: 30.7 pg (ref 26.0–34.0)
MCHC: 33.5 g/dL (ref 30.0–36.0)
MCV: 91.7 fL (ref 78.0–100.0)
Platelets: 92 10*3/uL — ABNORMAL LOW (ref 150–400)
RBC: 3.84 MIL/uL — AB (ref 4.22–5.81)
RDW: 13.6 % (ref 11.5–15.5)
WBC: 7.6 10*3/uL (ref 4.0–10.5)

## 2016-08-03 LAB — BASIC METABOLIC PANEL
ANION GAP: 7 (ref 5–15)
BUN: 13 mg/dL (ref 6–20)
CHLORIDE: 106 mmol/L (ref 101–111)
CO2: 24 mmol/L (ref 22–32)
Calcium: 8.1 mg/dL — ABNORMAL LOW (ref 8.9–10.3)
Creatinine, Ser: 0.88 mg/dL (ref 0.61–1.24)
GFR calc non Af Amer: >60 mL/min (ref >60)
Glucose, Bld: 117 mg/dL — ABNORMAL HIGH (ref 65–99)
POTASSIUM: 3.9 mmol/L (ref 3.5–5.1)
SODIUM: 137 mmol/L (ref 135–145)

## 2016-08-03 NOTE — H&P (Signed)
PATIENT ID: [name]        MRN:  [id number]          DOB/AGE: [birth year] / 81 y.o.    [name], MD   Biagio Borg, PA-C [address] [place], [place]  [address]                             [phone]   PROGRESS NOTE  Subjective:  negative for Chest Pain  negative for Shortness of Breath  negative for Nausea/Vomiting   negative for Calf Pain    Tolerating Diet: yes         Patient reports pain as mild.     Had BM this am, comfortable  Objective: Vital signs in last 24 hours:   Patient Vitals for the past 24 hrs:  BP Temp Temp src Pulse Resp SpO2  08/03/16 0651 108/61 98.9 F (37.2 C) Oral 80 16 90 %  08/02/16 2026 (!) 135/54 98.1 F (36.7 C) Oral 71 16 90 %  08/02/16 1249 - - - - - 98 %      Intake/Output from previous day:   11/29 0701 - 11/30 0700 In: 240 [P.O.:240] Out: 625 [Urine:200; Drains:425]   Intake/Output this shift:   11/30 0701 - 11/30 1900 In: 240 [P.O.:240] Out: 300 [Urine:300]   Intake/Output      11/29 0701 - 11/30 0700 11/30 0701 - 12/01 0700   P.O. 240 240   I.V. (mL/kg)     IV Piggyback     Total Intake(mL/kg) 240 (2.3) 240 (2.3)   Urine (mL/kg/hr) 200 (0.1) 300 (0.5)   Drains 425 (0.2)    Blood     Total Output 625 300   Net -385 -60           LABORATORY DATA:  Recent Labs  08/02/16 0435 08/03/16 0522  WBC 8.1 7.6  HGB 13.4 11.8*  HCT 38.6* 35.2*  PLT 115* 92*    Recent Labs  08/02/16 0435 08/03/16 0522  NA 139 137  K 3.7 3.9  CL 103 106  CO2 29 24  BUN 13 13  CREATININE 0.84 0.88  GLUCOSE 123* 117*  CALCIUM 8.3* 8.1*   Lab Results  Component Value Date   INR 1.09 07/20/2016   INR 1.08 03/20/2014   INR 1.00 07/12/2011    Recent Radiographic Studies :  Dg Chest 2 View  Result Date: 07/20/2016 CLINICAL DATA:  Preop chest radiograph for left knee arthroplasty EXAM: CHEST  2 VIEW COMPARISON:  04/02/2014 FINDINGS: Lungs are clear.  No pleural effusion or pneumothorax. The heart is  top-normal in size. Degenerative changes of the visualized thoracolumbar spine. Cholecystectomy clips. IMPRESSION: No evidence of acute cardiopulmonary disease. Electronically Signed   By: Julian Hy M.D.   On: 07/20/2016 10:57     Examination:  General appearance: alert, cooperative and no distress  Wound Exam: clean, dry, intact   Drainage:  Scant/small amount Serosanguinous exudate in hemovac  Motor Exam: EHL, FHL, Anterior Tibial and Posterior Tibial Intact  Sensory Exam: Superficial Peroneal, Deep Peroneal and Tibial normal  Vascular Exam: Normal  Assessment:    2 Days Post-Op  Procedure(s) (LRB): TOTAL KNEE ARTHROPLASTY (Left)  ADDITIONAL DIAGNOSIS:  Active Problems:   Unilateral primary osteoarthritis, left knee   S/P total knee replacement using cement, left  Acute Blood Loss Anemia-asymptomatic   Plan: Physical Therapy as ordered   DVT Prophylaxis:  Partial Weight  Bearing @ 50% (PWB)  DISCHARGE PLAN: Home  DISCHARGE NEEDS: HHPT, CPM, Walker and 3-in-1 comode seat Good effort in PT, no problem with voiding, abdomen soft and non tender-good BS, minimal hemovac drainage and removed, dressing changed and wleft knee wound clean and dry-will discharge today       Garald Balding  08/03/2016 12:20 PM

## 2016-08-03 NOTE — Progress Notes (Signed)
Orthopedic Tech Progress Note Patient Details:  [name] 01-02-1935 [id number]  Patient ID: [name], male   DOB: [birth year], 81 y.o.   MRN: [id number] Applied cpm 0-70  Karolee Stamps 08/03/2016, 5:49 AM

## 2016-08-03 NOTE — Progress Notes (Signed)
Physical Therapy Treatment Patient Details Name: [name] MRN: [id number] DOB: 1935-01-05 Today's Date: 08/03/2016    History of Present Illness Patient is a 81 y.o. male presented with a history of pain in the left knee for 3 years. Onset of symptoms was gradual starting a few years ago with rapidly worsening course since that time. In July of this year he apparently stepped out of his car and started and had pain in the parapatellar area. He also noted at that time he had increasing swelling more than usual. He has gotten to the point where he is having more episodes of catching, and giving way. Pt underwent a L TKA on 11/28.    PT Comments    Pt with increased fatigue today. Pt ambulated decreased distance and required min guard for safety. Pt able to participate in exercises and showed improved muscle activation and ROM. Pt was not able to stand independently from toilet and demonstrated attempts to stand with B UE on Rw; pt required less assistance with cues to place hands on toilet riser. Pt notes that he has a grab bar at home and will be able to push himself up with assistance from wife as needed. Pt's son will also be available to help intermittently. Pt continues to be a good candidate for HHPT to maximize strength gains and educate on use of DME.      Follow Up Recommendations  Home health PT     Equipment Recommendations  None recommended by PT    Recommendations for Other Services       Precautions / Restrictions Precautions Precautions: Fall Restrictions Weight Bearing Restrictions: Yes LLE Weight Bearing: Partial weight bearing LLE Partial Weight Bearing Percentage or Pounds: 50%    Mobility  Bed Mobility Overal bed mobility: Modified Independent             General bed mobility comments: HOB elevated; use of  bed rails  Transfers Overall transfer level: Needs assistance Equipment used: Rolling walker (2 wheeled) Transfers: Sit to/from Stand Sit  to Stand: Min guard;Min assist         General transfer comment: pt required min assist initially then min guard to transfer from sit to stand bed and toilet; verbal cues for hand placement  Ambulation/Gait Ambulation/Gait assistance: Min guard Ambulation Distance (Feet): 75 Feet Assistive device: Rolling walker (2 wheeled) Gait Pattern/deviations: Step-through pattern;Antalgic Gait velocity: decreased Gait velocity interpretation: Below normal speed for age/gender General Gait Details: min guard for safety as pt was more fatigued today; pt with good compliance of PWB status   Stairs            Wheelchair Mobility    Modified Rankin (Stroke Patients Only)       Balance Overall balance assessment: Needs assistance Sitting-balance support: Feet supported;No upper extremity supported Sitting balance-Leahy Scale: Good Sitting balance - Comments: pt able to sit EOB without support   Standing balance support: No upper extremity supported;During functional activity Standing balance-Leahy Scale: Fair Standing balance comment: pt able to maintain standing position while washing hands witih no UE assist                    Cognition Arousal/Alertness: Awake/alert Behavior During Therapy: WFL for tasks assessed/performed Overall Cognitive Status: Within Functional Limits for tasks assessed                      Exercises Total Joint Exercises Hip ABduction/ADduction: AROM;Left;10 reps;Supine Long Arc Quad:  AROM;Left;10 reps;Seated Knee Flexion: AROM;Left;10 reps;Seated Goniometric ROM: 22 to 77    General Comments General comments (skin integrity, edema, etc.): Pt required assist to stand and wipe in bathroom on PT arrival. Min assist given to transfer to standing and pt was able to wipe without assist once in standing position      Pertinent Vitals/Pain Pain Assessment: 0-10 Pain Score: 7  Pain Location: L knee Pain Descriptors / Indicators:  Aching;Sore Pain Intervention(s): Limited activity within patient's tolerance;Monitored during session;Repositioned;Ice applied    Home Living                      Prior Function            PT Goals (current goals can now be found in the care plan section) Acute Rehab PT Goals Patient Stated Goal: to be able to walk and go home PT Goal Formulation: With patient Time For Goal Achievement: 08/16/16 Potential to Achieve Goals: Good Progress towards PT goals: Progressing toward goals    Frequency    7X/week      PT Plan Current plan remains appropriate    Co-evaluation             End of Session Equipment Utilized During Treatment: Gait belt Activity Tolerance: Patient tolerated treatment well Patient left: with call bell/phone within reach;with family/visitor present;in bed     Time: 1030-1053 PT Time Calculation (min) (ACUTE ONLY): 23 min  Charges:  $Gait Training: 8-22 mins $Therapeutic Exercise: 8-22 mins                    G Codes:      Tonia Brooms 08/07/2016, 1:51 PM Tonia Brooms, SPT [phone]

## 2016-08-03 NOTE — Progress Notes (Signed)
Occupational Therapy Treatment Patient Details Name: [name] MRN: [id number] DOB: 1935/02/01 Today's Date: 08/03/2016    History of present illness Patient is a 81 y.o. male presented with a history of pain in the left knee for 3 years. Onset of symptoms was gradual starting a few years ago with rapidly worsening course since that time. In July of this year he apparently stepped out of his car and started and had pain in the parapatellar area. He also noted at that time he had increasing swelling more than usual. He has gotten to the point where he is having more episodes of catching, and giving way. Pt underwent a L TKA on 11/28.   OT comments  Pt making good progress with functional goals. OT will continue to follow  Follow Up Recommendations  No OT follow up;Supervision - Intermittent    Equipment Recommendations  None recommended by OT (pt familair with A/E fro past othro surgeries)    Recommendations for Other Services      Precautions / Restrictions Precautions Precautions: Fall Restrictions Weight Bearing Restrictions: Yes LLE Weight Bearing: Partial weight bearing LLE Partial Weight Bearing Percentage or Pounds: 50%       Mobility Bed Mobility Overal bed mobility: Modified Independent             General bed mobility comments: HOB elevated; use of  bed rails  Transfers Overall transfer level: Needs assistance Equipment used: Rolling walker (2 wheeled) Transfers: Sit to/from Stand Sit to Stand: Min guard;Min assist         General transfer comment: pt required min assist initially then min guard to transfer from sit to stand bed and toilet; verbal cues for hand placement    Balance Overall balance assessment: Needs assistance Sitting-balance support: Feet supported;No upper extremity supported Sitting balance-Leahy Scale: Good Sitting balance - Comments: pt able to sit EOB without support   Standing balance support: No upper extremity  supported;During functional activity Standing balance-Leahy Scale: Fair Standing balance comment: pt able to maintain standing position while washing hands witih no UE assist                   ADL Overall ADL's : Needs assistance/impaired             Lower Body Bathing: Minimal assistance;Sit to/from stand;With adaptive equipment Lower Body Bathing Details (indicate cue type and reason): simulated     Lower Body Dressing: Minimal assistance;Sit to/from stand;With adaptive equipment Lower Body Dressing Details (indicate cue type and reason): simulated Toilet Transfer: Min guard;RW;Ambulation;Comfort height toilet;Grab bars   Toileting- Clothing Manipulation and Hygiene: Sit to/from stand;Supervision/safety       Functional mobility during ADLs: Min guard;Rolling walker;Minimal assistance General ADL Comments: educated pt on use of ADL A/E for home use for LB ADLs. Pt familair with A/E fro past othro surgeries                Cognition   Behavior During Therapy: WFL for tasks assessed/performed Overall Cognitive Status: Within Functional Limits for tasks assessed                       Extremity/Trunk Assessment                          General Comments  pt very pleasant and cooperative    Pertinent Vitals/ Pain       Pain Assessment: 0-10 Pain Score: 7  Pain  Location: L knee Pain Descriptors / Indicators: Aching;Sore Pain Intervention(s): Limited activity within patient's tolerance;Monitored during session;Repositioned;Ice applied                                                          Frequency  Min 2X/week        Progress Toward Goals  OT Goals(current goals can now be found in the care plan section)  Progress towards OT goals: Progressing toward goals  Acute Rehab OT Goals Patient Stated Goal: to be able to walk and go home OT Goal Formulation: With patient  Plan Discharge plan remains appropriate                      End of Session Equipment Utilized During Treatment: Gait belt;Rolling walker CPM Left Knee CPM Left Knee: Off   Activity Tolerance Patient tolerated treatment well   Patient Left in chair;with call bell/phone within reach             Time: 0949-1006 OT Time Calculation (min): 17 min  Charges: OT General Charges $OT Visit: 1 Procedure OT Treatments $Self Care/Home Management : 8-22 mins  Britt Bottom 08/03/2016, 12:42 PM

## 2016-08-03 NOTE — Consult Note (Signed)
        [facility] CM Primary Care Navigator  08/03/2016  [name] 03-14-1935 [id number]      Met with patient at the bedside to identify possible discharge needs. Patient reports having increased, worsening pain to left knee with failed non-surgical interventions that led to this admission/ surgery.  Patient endorses Dr. Donnie Coffin with [facility] as the primary care provider.    Patient states using [facility] [phone]) to obtain medications without any problem.   He manages his own medications at home using "pill box" system.   Patient is independent with self care and able to drive prior to this admission. Wife [name]) will be providing transportation to his doctors' appointments after discharge.  Patient's wife is the primary caregiver at home per patient.  Discharge plan is home with home health services set-up with [facility].  Patient expressed reluctance to follow-up with PCP post hospitalization ("waste of time"). Explained to him the importance and benefits of following up with primary care provider post discharge.  Patient letter provided as a reminder.  Patient denies any needs or concerns at this time.  For additional questions please contact:  [name], BSN, RN-BC [facility] PRIMARY CARE Navigator Cell: [phone]

## 2016-08-04 DIAGNOSIS — N400 Enlarged prostate without lower urinary tract symptoms: Secondary | ICD-10-CM | POA: Diagnosis not present

## 2016-08-04 DIAGNOSIS — I119 Hypertensive heart disease without heart failure: Secondary | ICD-10-CM | POA: Diagnosis not present

## 2016-08-04 DIAGNOSIS — J45909 Unspecified asthma, uncomplicated: Secondary | ICD-10-CM | POA: Diagnosis not present

## 2016-08-04 DIAGNOSIS — M7071 Other bursitis of hip, right hip: Secondary | ICD-10-CM | POA: Diagnosis not present

## 2016-08-04 DIAGNOSIS — E785 Hyperlipidemia, unspecified: Secondary | ICD-10-CM | POA: Diagnosis not present

## 2016-08-04 DIAGNOSIS — Z471 Aftercare following joint replacement surgery: Secondary | ICD-10-CM | POA: Diagnosis not present

## 2016-08-07 ENCOUNTER — Telehealth (INDEPENDENT_AMBULATORY_CARE_PROVIDER_SITE_OTHER): Payer: Self-pay | Admitting: Orthopaedic Surgery

## 2016-08-07 DIAGNOSIS — I119 Hypertensive heart disease without heart failure: Secondary | ICD-10-CM | POA: Diagnosis not present

## 2016-08-07 DIAGNOSIS — E785 Hyperlipidemia, unspecified: Secondary | ICD-10-CM | POA: Diagnosis not present

## 2016-08-07 DIAGNOSIS — J45909 Unspecified asthma, uncomplicated: Secondary | ICD-10-CM | POA: Diagnosis not present

## 2016-08-07 DIAGNOSIS — Z471 Aftercare following joint replacement surgery: Secondary | ICD-10-CM | POA: Diagnosis not present

## 2016-08-07 DIAGNOSIS — M7071 Other bursitis of hip, right hip: Secondary | ICD-10-CM | POA: Diagnosis not present

## 2016-08-07 DIAGNOSIS — N400 Enlarged prostate without lower urinary tract symptoms: Secondary | ICD-10-CM | POA: Diagnosis not present

## 2016-08-07 NOTE — Telephone Encounter (Signed)
called

## 2016-08-07 NOTE — Telephone Encounter (Signed)
Patient's wife [name] called about patient. Says his incision is burning/stinging and sore to the touch. Wife said she tried looking and the incision looks red. Please call her as soon as possible.

## 2016-08-07 NOTE — Telephone Encounter (Signed)
Please call.

## 2016-08-09 DIAGNOSIS — E785 Hyperlipidemia, unspecified: Secondary | ICD-10-CM | POA: Diagnosis not present

## 2016-08-09 DIAGNOSIS — I119 Hypertensive heart disease without heart failure: Secondary | ICD-10-CM | POA: Diagnosis not present

## 2016-08-09 DIAGNOSIS — J45909 Unspecified asthma, uncomplicated: Secondary | ICD-10-CM | POA: Diagnosis not present

## 2016-08-09 DIAGNOSIS — M7071 Other bursitis of hip, right hip: Secondary | ICD-10-CM | POA: Diagnosis not present

## 2016-08-09 DIAGNOSIS — Z471 Aftercare following joint replacement surgery: Secondary | ICD-10-CM | POA: Diagnosis not present

## 2016-08-09 DIAGNOSIS — N400 Enlarged prostate without lower urinary tract symptoms: Secondary | ICD-10-CM | POA: Diagnosis not present

## 2016-08-10 ENCOUNTER — Telehealth (INDEPENDENT_AMBULATORY_CARE_PROVIDER_SITE_OTHER): Payer: Self-pay | Admitting: Orthopaedic Surgery

## 2016-08-10 ENCOUNTER — Other Ambulatory Visit (INDEPENDENT_AMBULATORY_CARE_PROVIDER_SITE_OTHER): Payer: Self-pay

## 2016-08-10 MED ORDER — OXYCODONE HCL 5 MG PO TABS: 5.0000 mg | ORAL_TABLET | ORAL | 0 refills | Status: DC | PRN

## 2016-08-10 NOTE — Telephone Encounter (Signed)
done

## 2016-08-10 NOTE — Telephone Encounter (Signed)
Patient is requesting refill of oxycodone

## 2016-08-10 NOTE — Telephone Encounter (Signed)
OK to refill

## 2016-08-10 NOTE — Telephone Encounter (Signed)
Please advise 

## 2016-08-11 DIAGNOSIS — I119 Hypertensive heart disease without heart failure: Secondary | ICD-10-CM | POA: Diagnosis not present

## 2016-08-11 DIAGNOSIS — E785 Hyperlipidemia, unspecified: Secondary | ICD-10-CM | POA: Diagnosis not present

## 2016-08-11 DIAGNOSIS — M7071 Other bursitis of hip, right hip: Secondary | ICD-10-CM | POA: Diagnosis not present

## 2016-08-11 DIAGNOSIS — J45909 Unspecified asthma, uncomplicated: Secondary | ICD-10-CM | POA: Diagnosis not present

## 2016-08-11 DIAGNOSIS — Z471 Aftercare following joint replacement surgery: Secondary | ICD-10-CM | POA: Diagnosis not present

## 2016-08-11 DIAGNOSIS — N400 Enlarged prostate without lower urinary tract symptoms: Secondary | ICD-10-CM | POA: Diagnosis not present

## 2016-08-14 ENCOUNTER — Encounter (INDEPENDENT_AMBULATORY_CARE_PROVIDER_SITE_OTHER): Payer: Self-pay | Admitting: Orthopaedic Surgery

## 2016-08-14 ENCOUNTER — Ambulatory Visit (INDEPENDENT_AMBULATORY_CARE_PROVIDER_SITE_OTHER): Payer: Medicare Other

## 2016-08-14 ENCOUNTER — Ambulatory Visit (INDEPENDENT_AMBULATORY_CARE_PROVIDER_SITE_OTHER): Payer: Medicare Other | Admitting: Orthopaedic Surgery

## 2016-08-14 VITALS — BP 146/77 | HR 70 | Ht 75.0 in | Wt 235.0 lb

## 2016-08-14 DIAGNOSIS — G8929 Other chronic pain: Secondary | ICD-10-CM

## 2016-08-14 DIAGNOSIS — M25562 Pain in left knee: Secondary | ICD-10-CM | POA: Diagnosis not present

## 2016-08-14 DIAGNOSIS — Z471 Aftercare following joint replacement surgery: Secondary | ICD-10-CM | POA: Diagnosis not present

## 2016-08-14 DIAGNOSIS — J45909 Unspecified asthma, uncomplicated: Secondary | ICD-10-CM | POA: Diagnosis not present

## 2016-08-14 DIAGNOSIS — M7071 Other bursitis of hip, right hip: Secondary | ICD-10-CM | POA: Diagnosis not present

## 2016-08-14 DIAGNOSIS — E785 Hyperlipidemia, unspecified: Secondary | ICD-10-CM | POA: Diagnosis not present

## 2016-08-14 DIAGNOSIS — N400 Enlarged prostate without lower urinary tract symptoms: Secondary | ICD-10-CM | POA: Diagnosis not present

## 2016-08-14 DIAGNOSIS — I119 Hypertensive heart disease without heart failure: Secondary | ICD-10-CM | POA: Diagnosis not present

## 2016-08-14 NOTE — Progress Notes (Signed)
Office Visit Note   Patient: [name]           Date of Birth: 1935-01-31           MRN: [id number] Visit Date: 08/14/2016              Requested by: L.Donnie Coffin, MD [place] [address] [employer] [address] [place], [place] [address] PCP: Donnie Coffin, MD   Assessment & Plan: Visit Diagnoses: 2 weeks status post left total knee replacement doing well  Plan: We will see in 1 week. May weight-bear as tolerated with a walker or cane. He will start taking 81 mg of aspirin on a daily basis and discontinue the support stockings.  Follow-Up Instructions: No Follow-up on file.   Orders:  No orders of the defined types were placed in this encounter.  No orders of the defined types were placed in this encounter.     Procedures: No procedures performed   Clinical Data: No additional findings.   Subjective: Chief Complaint  Patient presents with  . Left Knee - Routine Post Op    Post op Left TKA on 08/01/16. Knee swollen and staples are intact.  Pt states no pain now  In home PT , PT is going well  Is it time for outpatient. He wants to go for PT at [facility]   denies shortness of breath or chest pain.  He denies any swelling distally. He has not taken any pain medicines in days.  Review of Systems   Objective: Vital Signs: There were no vitals taken for this visit.  Physical Exam  Ortho Exam left knee exam demonstrates wound to be intact. Surgical clips are removed and Steri-Strips applied. He needs to check for any blistering Steri-Strips. Neurovascular exam is intact. He has over 100 of flexion and just about full extension. There is a small effusion. No instability  Specialty Comments:  No specialty comments available.  Imaging: No results found.   PMFS History: Patient Active Problem List   Diagnosis Date Noted  . S/P total knee replacement using cement, left 08/01/2016  . RBBB (right bundle branch block with left anterior fascicular block)  04/12/2016  . PVC's (premature ventricular contractions) 04/12/2016  . Chest wall pain 04/12/2016  . Bronchial asthma 06/29/2014  . Urinary retention due to benign prostatic hyperplasia 04/03/2014  . Ileus, postoperative ([place]) 04/03/2014  . Osteoarthritis of right knee 04/01/2014  . S/P total knee replacement using cement 03/31/2014  . Skin rash 04/11/2013  . Pseudogout of knee 12/12/2012  . Dyspepsia 08/14/2012  . Trochanteric Bursitis of right hip 07/18/2011  . Benign hypertensive heart disease without heart failure 12/13/2010  . BPH (benign prostatic hyperplasia) 12/13/2010  . Unilateral primary osteoarthritis, left knee 12/13/2010  . Dyslipidemia 12/13/2010  . Status post cholecystectomy 12/13/2010   Past Medical History:  Diagnosis Date  . Arthritis   . Asthma    only if develops cold  . Dysrhythmia    h/o pvc's  . GERD (gastroesophageal reflux disease)   . Kidney stone on left side   . PVC (premature ventricular contraction)   . Recurrent upper respiratory infection (URI) 03/2011    Family History  Problem Relation Age of Onset  . Heart disease Mother   . Heart attack Mother   . Anesthesia problems Neg Hx   . Hypotension Neg Hx   . Malignant hyperthermia Neg Hx   . Pseudochol deficiency Neg Hx     Past Surgical History:  Procedure Laterality Date  . APPENDECTOMY    . BACK SURGERY     x 4  . CARDIAC CATHETERIZATION     july 2010-minor irregl  . CERVICAL SPINE SURGERY     x 2  . CHOLECYSTECTOMY     2008  . EYE SURGERY     bilat cataract  . INCISION AND DRAINAGE HIP  07/18/2011   Procedure: IRRIGATION AND DEBRIDEMENT HIP;  Surgeon: Garald Balding, MD;  Location: [place];  Service: Orthopedics;  Laterality: Right;  RIGHT HIP EXPLORATION, EXCISION OF DEEP BURSAL SAC  . KIDNEY STONE SURGERY  2011   R  . KNEE ARTHROSCOPY     Left  . SHOULDER ARTHROSCOPY     Left  . TOTAL HIP ARTHROPLASTY     Right  . TOTAL HIP REVISION     Right  . TOTAL KNEE  ARTHROPLASTY Right 03/31/2014   Procedure: TOTAL KNEE ARTHROPLASTY;  Surgeon: Garald Balding, MD;  Location: [place];  Service: Orthopedics;  Laterality: Right;  . TOTAL KNEE ARTHROPLASTY Left 08/01/2016   Procedure: TOTAL KNEE ARTHROPLASTY;  Surgeon: Garald Balding, MD;  Location: [place];  Service: Orthopedics;  Laterality: Left;  . WRIST SURGERY     bilateral, plates   Social History   Occupational History  . Not on file.   Social History Main Topics  . Smoking status: Never Smoker  . Smokeless tobacco: Never Used  . Alcohol use No  . Drug use: No  . Sexual activity: Not on file

## 2016-08-16 ENCOUNTER — Ambulatory Visit: Payer: Medicare Other | Attending: Orthopaedic Surgery | Admitting: Physical Therapy

## 2016-08-16 ENCOUNTER — Encounter: Payer: Self-pay | Admitting: Physical Therapy

## 2016-08-16 DIAGNOSIS — R600 Localized edema: Secondary | ICD-10-CM | POA: Insufficient documentation

## 2016-08-16 DIAGNOSIS — M25562 Pain in left knee: Secondary | ICD-10-CM | POA: Diagnosis not present

## 2016-08-16 DIAGNOSIS — M25662 Stiffness of left knee, not elsewhere classified: Secondary | ICD-10-CM | POA: Diagnosis not present

## 2016-08-16 DIAGNOSIS — R262 Difficulty in walking, not elsewhere classified: Secondary | ICD-10-CM | POA: Insufficient documentation

## 2016-08-16 NOTE — Therapy (Signed)
[place], [place], [address] Phone: [phone]   Fax:  [phone]  Physical Therapy Evaluation  Patient Details  Name: [name] MRN: [id number] Date of Birth: [birth year] Referring Provider: Dr. Durward Fortes  Encounter Date: 08/16/2016      PT End of Session - 08/16/16 1604    Visit Number 1   Date for PT Re-Evaluation 10/11/16   PT Start Time 0320   PT Stop Time 0410   PT Time Calculation (min) 50 min   Equipment Utilized During Treatment Other (comment)  4 pt cane   Activity Tolerance Patient tolerated treatment well   Behavior During Therapy [facility] for tasks assessed/performed      Past Medical History:  Diagnosis Date  . Arthritis   . Asthma    only if develops cold  . Dysrhythmia    h/o pvc's  . GERD (gastroesophageal reflux disease)   . Kidney stone on left side   . PVC (premature ventricular contraction)   . Recurrent upper respiratory infection (URI) 03/2011    Past Surgical History:  Procedure Laterality Date  . APPENDECTOMY    . BACK SURGERY     x 4  . CARDIAC CATHETERIZATION     july 2010-minor irregl  . CERVICAL SPINE SURGERY     x 2  . CHOLECYSTECTOMY     2008  . EYE SURGERY     bilat cataract  . INCISION AND DRAINAGE HIP  07/18/2011   Procedure: IRRIGATION AND DEBRIDEMENT HIP;  Surgeon: Garald Balding, MD;  Location: [place];  Service: Orthopedics;  Laterality: Right;  RIGHT HIP EXPLORATION, EXCISION OF DEEP BURSAL SAC  . KIDNEY STONE SURGERY  2011   R  . KNEE ARTHROSCOPY     Left  . SHOULDER ARTHROSCOPY     Left  . TOTAL HIP ARTHROPLASTY     Right  . TOTAL HIP REVISION     Right  . TOTAL KNEE ARTHROPLASTY Right 03/31/2014   Procedure: TOTAL KNEE ARTHROPLASTY;  Surgeon: Garald Balding, MD;  Location: [place];  Service: Orthopedics;  Laterality: Right;  . TOTAL KNEE ARTHROPLASTY Left 08/01/2016   Procedure: TOTAL KNEE ARTHROPLASTY;  Surgeon: Garald Balding, MD;  Location: [place];  Service: Orthopedics;  Laterality: Left;  . WRIST SURGERY     bilateral, plates    There were no vitals filed for this visit.       Subjective Assessment - 08/16/16 1524    Subjective pt underwent left TKR on 08/01/16. Pt was in the hospital for two nights. He underwent home therapy for three days and that was discontinued last Friday. He has been performing  home exercises three times a day and icing every 4 hours. He just switched to the 4 point cane this past Monday.    Patient is accompained by: Family member   Limitations Walking;Standing;Other (comment)  bending over to put on shoes   How long can you stand comfortably? "long enough to take a shower"   How long can you walk comfortably? 200'   Diagnostic tests Xray   Patient Stated Goals playing golf, yardwork, putting on shoes without assistance   Currently in Pain? Yes   Pain Score 5    Pain Location Knee   Pain Orientation Anterior;Left;Medial;Lateral   Pain Descriptors / Indicators Aching;Sharp   Pain Type Surgical pain   Pain Onset 1 to 4 weeks ago   Pain Frequency Constant  Aggravating Factors  walking, flexing knee, sleeping,    Pain Relieving Factors Oxycodone, finding comfortable position            [facility] PT Assessment - 08/16/16 0001      Assessment   Medical Diagnosis Left TKR   Referring Provider Dr. Durward Fortes   Onset Date/Surgical Date 08/01/16   Next MD Visit 08/21/16   Prior Therapy yes     Balance Screen   Has the patient fallen in the past 6 months No   Has the patient had a decrease in activity level because of a fear of falling?  No     Home Ec[place] residence   Living Arrangements Spouse/significant other   Type of [place] to enter   Entrance Stairs-Number of Steps 1     Prior Function   Level of [place] Retired   Leisure play golf, playing with grandchildren and  great grandchildren, traveling     Observation/Other Assessments   Observations scar looks good     Observation/Other Assessments-Edema    Edema Circumferential  40 cm, 35.5 cm right leg; 45 cm 41 cm     ROM / Strength   AROM / PROM / Strength AROM;PROM;Strength     AROM   AROM Assessment Site Knee   Right/Left Knee Right;Left   Right Knee Extension 1   Right Knee Flexion 125  seated   Left Knee Extension 10  supine   Left Knee Flexion 90  seated, 100 supine     PROM   PROM Assessment Site Knee   Right/Left Knee Right   Right Knee Extension 5   Right Knee Flexion 105  supine     Strength   Strength Assessment Site Knee   Right/Left Knee Right;Left   Right Knee Flexion 5/5   Right Knee Extension 5/5   Left Knee Flexion 4-/5   Left Knee Extension 4-/5     Ambulation/Gait   Ambulation/Gait Yes   Ambulation/Gait Assistance 6: Modified independent (Device/Increase time)   Assistive device Large base quad cane   Gait Pattern Step-to pattern   Gait Comments Adjusted cane to better fit pt. (was too short and he leaned to the right with each step)                   [facility] Adult PT Treatment/Exercise - 08/16/16 0001      Modalities   Modalities Cryotherapy;Electrical Stimulation     Cryotherapy   Number Minutes Cryotherapy 15 Minutes   Cryotherapy Location Knee   Type of Cryotherapy Ice pack     Electrical Stimulation   Electrical Stimulation Location left knee   Electrical Stimulation Action IFC   Electrical Stimulation Goals Pain;Edema                PT Education - 08/16/16 1604    Education provided Yes   Education Details Continue current HEP, adjusted cane 2 levels   Person(s) Educated Patient   Methods Explanation;Demonstration   Comprehension Verbalized understanding          PT Short Term Goals - 08/16/16 1610      PT SHORT TERM GOAL #1   Title Pt will demonstrate proper recall of current HEP .   Time 1   Period Weeks    Status New           PT Long Term Goals - 08/16/16 1610  PT LONG TERM GOAL #1   Title Pt will accomplish 0-120 AROM in the left knee in order to normalize gait and accomplish other functional activities.   Time 8   Period Weeks   Status New     PT LONG TERM GOAL #2   Title Pt will be able to ascend and descend a flight of stairs with reciprocal pattern.   Time 8   Period Weeks   Status New     PT LONG TERM GOAL #3   Title Pt will be able to swing through a golf swing consistenly with no increase in pain or compensatory patterns.   Time 8   Period Weeks   Status New     PT LONG TERM GOAL #4   Title Pt will increase left knee strength (flexion,extension) to 5/5 in order to decrease compensatory patterns.   Time 8   Period Weeks   Status New               Plan - 08/16/16 1605    Clinical Impression Statement Pt presents with decreased AROM, PROM and strength in the left knee. Pt also has increased edema around the left knee joint and what seems like a palpable fluid pocket on the lateral side of the distal femur. Pt explained that the doctor is aware of the pocket and is waiting a week to see if the fluid pocket decreases. Pt is doing well since the surgery and does not have any concerns today. Pt is compliant with current HEP and seems very motivated to get better.     Rehab Potential Good   PT Frequency 3x / week   PT Duration 8 weeks   PT Treatment/Interventions Vasopneumatic Device;Energy conservation;Manual techniques;Compression bandaging;Passive range of motion;Patient/family education;Neuromuscular re-education;Balance training;Therapeutic exercise;Therapeutic activities;Moist Heat;Ultrasound;Gait training;Stair training;Functional mobility training;Electrical Stimulation;Cryotherapy;ADLs/Self Care Home Management   PT Next Visit Plan seated LE strenghtening, manual therapy, Pain management   PT Home Exercise Plan Continue with current   Consulted and Agree  with Plan of Care Patient;Family member/caregiver      Patient will benefit from skilled therapeutic intervention in order to improve the following deficits and impairments:  Abnormal gait, Decreased balance, Decreased activity tolerance, Decreased mobility, Decreased endurance, Decreased coordination, Decreased range of motion, Decreased strength, Hypomobility, Difficulty walking, Increased edema, Impaired flexibility, Impaired tone, Improper body mechanics, Pain  Visit Diagnosis: Difficulty in walking, not elsewhere classified  Acute pain of left knee  Localized edema     Problem List Patient Active Problem List   Diagnosis Date Noted  . S/P total knee replacement using cement, left 08/01/2016  . RBBB (right bundle branch block with left anterior fascicular block) 04/12/2016  . PVC's (premature ventricular contractions) 04/12/2016  . Chest wall pain 04/12/2016  . Bronchial asthma 06/29/2014  . Urinary retention due to benign prostatic hyperplasia 04/03/2014  . Ileus, postoperative ([place]) 04/03/2014  . Osteoarthritis of right knee 04/01/2014  . S/P total knee replacement using cement 03/31/2014  . Skin rash 04/11/2013  . Pseudogout of knee 12/12/2012  . Dyspepsia 08/14/2012  . Trochanteric Bursitis of right hip 07/18/2011  . Benign hypertensive heart disease without heart failure 12/13/2010  . BPH (benign prostatic hyperplasia) 12/13/2010  . Unilateral primary osteoarthritis, left knee 12/13/2010  . Dyslipidemia 12/13/2010  . Status post cholecystectomy 12/13/2010    Toy Baker, SPT 08/16/2016, 4:24 PM  [place], [place], [address] Phone: [phone]  Fax:  [phone]  Name: [name] MRN: [id number] Date of Birth: 11-Dec-1934

## 2016-08-17 ENCOUNTER — Ambulatory Visit: Payer: Medicare Other | Admitting: Physical Therapy

## 2016-08-17 ENCOUNTER — Encounter: Payer: Self-pay | Admitting: Physical Therapy

## 2016-08-17 DIAGNOSIS — M25662 Stiffness of left knee, not elsewhere classified: Secondary | ICD-10-CM

## 2016-08-17 DIAGNOSIS — M25562 Pain in left knee: Secondary | ICD-10-CM

## 2016-08-17 DIAGNOSIS — R600 Localized edema: Secondary | ICD-10-CM | POA: Diagnosis not present

## 2016-08-17 DIAGNOSIS — R262 Difficulty in walking, not elsewhere classified: Secondary | ICD-10-CM

## 2016-08-17 NOTE — Therapy (Signed)
[place], [place], [address] Phone: [phone]   Fax:  [phone]  Physical Therapy Treatment  Patient Details  Name: [name] MRN: [id number] Date of Birth: 06/05/1935 Referring Provider: Dr. Durward Fortes  Encounter Date: 08/17/2016      PT End of Session - 08/16/16 1604    Visit Number 1   Date for PT Re-Evaluation 10/11/16   PT Start Time 0320   PT Stop Time 0410   PT Time Calculation (min) 50 min   Equipment Utilized During Treatment Other (comment)  4 pt cane   Activity Tolerance Patient tolerated treatment well   Behavior During Therapy [facility] for tasks assessed/performed      Past Medical History:  Diagnosis Date  . Arthritis   . Asthma    only if develops cold  . Dysrhythmia    h/o pvc's  . GERD (gastroesophageal reflux disease)   . Kidney stone on left side   . PVC (premature ventricular contraction)   . Recurrent upper respiratory infection (URI) 03/2011    Past Surgical History:  Procedure Laterality Date  . APPENDECTOMY    . BACK SURGERY     x 4  . CARDIAC CATHETERIZATION     july 2010-minor irregl  . CERVICAL SPINE SURGERY     x 2  . CHOLECYSTECTOMY     2008  . EYE SURGERY     bilat cataract  . INCISION AND DRAINAGE HIP  07/18/2011   Procedure: IRRIGATION AND DEBRIDEMENT HIP;  Surgeon: Garald Balding, MD;  Location: [place];  Service: Orthopedics;  Laterality: Right;  RIGHT HIP EXPLORATION, EXCISION OF DEEP BURSAL SAC  . KIDNEY STONE SURGERY  2011   R  . KNEE ARTHROSCOPY     Left  . SHOULDER ARTHROSCOPY     Left  . TOTAL HIP ARTHROPLASTY     Right  . TOTAL HIP REVISION     Right  . TOTAL KNEE ARTHROPLASTY Right 03/31/2014   Procedure: TOTAL KNEE ARTHROPLASTY;  Surgeon: Garald Balding, MD;  Location: [place];  Service: Orthopedics;  Laterality: Right;  . TOTAL KNEE ARTHROPLASTY Left 08/01/2016   Procedure: TOTAL KNEE ARTHROPLASTY;  Surgeon: Garald Balding, MD;  Location: [place];  Service: Orthopedics;  Laterality: Left;  . WRIST SURGERY     bilateral, plates    There were no vitals filed for this visit.      Subjective Assessment - 08/17/16 1410    Subjective Pt states he is doing well today. He noticed some pain last night due to increased walking. He also reports that he still has the fluid pocket on the lateral side.   Currently in Pain? Yes   Pain Score 4    Pain Location Knee   Pain Orientation Left;Anterior;Medial;Lateral                         [facility] Adult PT Treatment/Exercise - 08/17/16 0001      Exercises   Exercises Knee/Hip;Ankle     Knee/Hip Exercises: Aerobic   Recumbent Bike 5 minutes   Nustep 6 minutes, lvl 5     Knee/Hip Exercises: Machines for Strengthening   Cybex Leg Press up wiht both, down with left, 2x10, 40 lb     Knee/Hip Exercises: Standing   Heel Raises 1 set;20 reps;Both  up on toes, back on heels     Knee/Hip Exercises: Supine  Straight Leg Raises Strengthening;Left;3 sets;10 reps   Knee Flexion Strengthening;AROM;10 reps;3 sets  with green physioball     Modalities   Modalities Vasopneumatic;Electrical Stimulation     Electrical Stimulation   Electrical Stimulation Location left knee   Electrical Stimulation Action IFC   Electrical Stimulation Parameters supine   Electrical Stimulation Goals Edema;Pain     Vasopneumatic   Number Minutes Vasopneumatic  15 minutes   Vasopnuematic Location  Knee   Vasopneumatic Pressure Medium   Vasopneumatic Temperature  32     Manual Therapy   Manual Therapy Passive ROM;Muscle Energy Technique   Passive ROM HS, SKC, gastroc stretch   Muscle Energy Technique contract relax: flexion/extension                PT Education - August 17, 2016 1604    Education provided Yes   Education Details Continue current HEP, adjusted cane 2 levels   Person(s) Educated Patient   Methods Explanation;Demonstration   Comprehension  Verbalized understanding          PT Short Term Goals - 08/17/16 1425      PT SHORT TERM GOAL #1   Status Achieved           PT Long Term Goals - 08/17/16 1425      PT LONG TERM GOAL #1   Status On-going     PT LONG TERM GOAL #2   Status On-going     PT LONG TERM GOAL #3   Status Unable to assess     PT LONG TERM GOAL #4   Status On-going               Plan - 08-17-2016 1605    Clinical Impression Statement Pt presents with decreased AROM, PROM and strength in the left knee. Pt also has increased edema around the left knee joint and what seems like a palpable fluid pocket on the lateral side of the distal femur. Pt explained that the doctor is aware of the pocket and is waiting a week to see if the fluid pocket decreases. Pt is doing well since the surgery and does not have any concerns today. Pt is compliant with current HEP and seems very motivated to get better.     Rehab Potential Good   PT Frequency 3x / week   PT Duration 8 weeks   PT Treatment/Interventions Vasopneumatic Device;Energy conservation;Manual techniques;Compression bandaging;Passive range of motion;Patient/family education;Neuromuscular re-education;Balance training;Therapeutic exercise;Therapeutic activities;Moist Heat;Ultrasound;Gait training;Stair training;Functional mobility training;Electrical Stimulation;Cryotherapy;ADLs/Self Care Home Management   PT Next Visit Plan seated LE strenghtening, manual therapy, Pain management   PT Home Exercise Plan Continue with current   Consulted and Agree with Plan of Care Patient;Family member/caregiver      Patient will benefit from skilled therapeutic intervention in order to improve the following deficits and impairments:     Visit Diagnosis: Difficulty in walking, not elsewhere classified  Acute pain of left knee  Localized edema  Stiffness of left knee, not elsewhere classified       G-Codes - 08-17-2016 1708    Functional Assessment Tool  Used foto 74% limitation   Functional Limitation Mobility: Walking and moving around   Mobility: Walking and Moving Around Current Status [id number]) At least 60 percent but less than 80 percent impaired, limited or restricted   Mobility: Walking and Moving Around Goal Status [phone]) At least 40 percent but less than 60 percent impaired, limited or restricted      Problem List Patient Active Problem List  Diagnosis Date Noted  . S/P total knee replacement using cement, left 08/01/2016  . RBBB (right bundle branch block with left anterior fascicular block) 04/12/2016  . PVC's (premature ventricular contractions) 04/12/2016  . Chest wall pain 04/12/2016  . Bronchial asthma 06/29/2014  . Urinary retention due to benign prostatic hyperplasia 04/03/2014  . Ileus, postoperative ([place]) 04/03/2014  . Osteoarthritis of right knee 04/01/2014  . S/P total knee replacement using cement 03/31/2014  . Skin rash 04/11/2013  . Pseudogout of knee 12/12/2012  . Dyspepsia 08/14/2012  . Trochanteric Bursitis of right hip 07/18/2011  . Benign hypertensive heart disease without heart failure 12/13/2010  . BPH (benign prostatic hyperplasia) 12/13/2010  . Unilateral primary osteoarthritis, left knee 12/13/2010  . Dyslipidemia 12/13/2010  . Status post cholecystectomy 12/13/2010    Toy Baker, SPT 08/17/2016, 2:47 PM  [place], [place], [address] Phone: [phone]   Fax:  [phone]  Name: [name] MRN: [id number] Date of Birth: [birth year]

## 2016-08-21 ENCOUNTER — Ambulatory Visit (INDEPENDENT_AMBULATORY_CARE_PROVIDER_SITE_OTHER): Payer: Medicare Other | Admitting: Orthopaedic Surgery

## 2016-08-21 ENCOUNTER — Encounter (INDEPENDENT_AMBULATORY_CARE_PROVIDER_SITE_OTHER): Payer: Self-pay | Admitting: Orthopaedic Surgery

## 2016-08-21 ENCOUNTER — Encounter: Payer: Self-pay | Admitting: Physical Therapy

## 2016-08-21 ENCOUNTER — Ambulatory Visit: Payer: Medicare Other | Admitting: Physical Therapy

## 2016-08-21 VITALS — Ht 75.0 in | Wt 235.0 lb

## 2016-08-21 DIAGNOSIS — M1712 Unilateral primary osteoarthritis, left knee: Secondary | ICD-10-CM

## 2016-08-21 DIAGNOSIS — M25562 Pain in left knee: Secondary | ICD-10-CM

## 2016-08-21 DIAGNOSIS — R262 Difficulty in walking, not elsewhere classified: Secondary | ICD-10-CM

## 2016-08-21 DIAGNOSIS — R600 Localized edema: Secondary | ICD-10-CM | POA: Diagnosis not present

## 2016-08-21 DIAGNOSIS — M25662 Stiffness of left knee, not elsewhere classified: Secondary | ICD-10-CM

## 2016-08-21 NOTE — Therapy (Signed)
[place], [place], [address] Phone: [phone]   Fax:  [phone]  Physical Therapy Treatment  Patient Details  Name: [name] MRN: [id number] Date of Birth: 19-Aug-1935 Referring Provider: Dr. Durward Fortes  Encounter Date: 08/21/2016      PT End of Session - 08/21/16 1145    Visit Number 2   Date for PT Re-Evaluation 10/11/16   PT Start Time 1100   PT Stop Time 1155   PT Time Calculation (min) 55 min   Activity Tolerance Patient tolerated treatment well;No increased pain   Behavior During Therapy [facility] for tasks assessed/performed      Past Medical History:  Diagnosis Date  . Arthritis   . Asthma    only if develops cold  . Dysrhythmia    h/o pvc's  . GERD (gastroesophageal reflux disease)   . Kidney stone on left side   . PVC (premature ventricular contraction)   . Recurrent upper respiratory infection (URI) 03/2011    Past Surgical History:  Procedure Laterality Date  . APPENDECTOMY    . BACK SURGERY     x 4  . CARDIAC CATHETERIZATION     july 2010-minor irregl  . CERVICAL SPINE SURGERY     x 2  . CHOLECYSTECTOMY     2008  . EYE SURGERY     bilat cataract  . INCISION AND DRAINAGE HIP  07/18/2011   Procedure: IRRIGATION AND DEBRIDEMENT HIP;  Surgeon: Garald Balding, MD;  Location: [place];  Service: Orthopedics;  Laterality: Right;  RIGHT HIP EXPLORATION, EXCISION OF DEEP BURSAL SAC  . KIDNEY STONE SURGERY  2011   R  . KNEE ARTHROSCOPY     Left  . SHOULDER ARTHROSCOPY     Left  . TOTAL HIP ARTHROPLASTY     Right  . TOTAL HIP REVISION     Right  . TOTAL KNEE ARTHROPLASTY Right 03/31/2014   Procedure: TOTAL KNEE ARTHROPLASTY;  Surgeon: Garald Balding, MD;  Location: [place];  Service: Orthopedics;  Laterality: Right;  . TOTAL KNEE ARTHROPLASTY Left 08/01/2016   Procedure: TOTAL KNEE ARTHROPLASTY;  Surgeon: Garald Balding, MD;  Location: [place];  Service:  Orthopedics;  Laterality: Left;  . WRIST SURGERY     bilateral, plates    There were no vitals filed for this visit.      Subjective Assessment - 08/21/16 1116    Subjective Pt states he is doing okay however the knee is very tight this morning. He is still performing HEP 3x/day. He will be visiting the surgeon this afternoon.   Currently in Pain? Yes   Pain Score 5    Pain Location Knee   Pain Orientation Left;Medial   Pain Type Surgical pain                         [facility] Adult PT Treatment/Exercise - 08/21/16 0001      Knee/Hip Exercises: Aerobic   Recumbent Bike 5 minutes   Nustep 6 minutes, lvl 6     Knee/Hip Exercises: Machines for Strengthening   Cybex Leg Press 40 lb, 2x15     Knee/Hip Exercises: Standing   Heel Raises 1 set;20 reps;Both  up on toes, back on heels on airex pad     Knee/Hip Exercises: Seated   Long Arc Quad Strengthening;2 sets;10 reps;Left;Weights   Long Arc Quad Weight 3 lbs.  Knee/Hip Exercises: Supine   Straight Leg Raises Strengthening;Left;3 sets;10 reps  3 lb ankle weight     Knee/Hip Exercises: Prone   Prone Knee Hang Weights;2 minutes   Prone Knee Hang Weights (lbs) 3     Manual Therapy   Manual Therapy Passive ROM;Joint mobilization   Joint Mobilization AP jt mob femur on tib   Passive ROM HS, gastroc, SKC                PT Education - 08/21/16 1145    Education provided No          PT Short Term Goals - 08/17/16 1425      PT SHORT TERM GOAL #1   Status Achieved           PT Long Term Goals - 08/17/16 1425      PT LONG TERM GOAL #1   Status On-going     PT LONG TERM GOAL #2   Status On-going     PT LONG TERM GOAL #3   Status Unable to assess     PT LONG TERM GOAL #4   Status On-going               Plan - 08/21/16 1145    Clinical Impression Statement Pt tolerated tretament well and was able to complete all exercises despite feeling some stiffness and discomfort. Pt  is doing excellent with active flexion however extension is lacking some. Continuet to progress per pt tolerance.   Rehab Potential Good   PT Frequency 3x / week   PT Duration 8 weeks   PT Treatment/Interventions Vasopneumatic Device;Energy conservation;Manual techniques;Compression bandaging;Passive range of motion;Patient/family education;Neuromuscular re-education;Balance training;Therapeutic exercise;Therapeutic activities;Moist Heat;Ultrasound;Gait training;Stair training;Functional mobility training;Electrical Stimulation;Cryotherapy;ADLs/Self Care Home Management   PT Next Visit Plan seated LE strenghtening, manual therapy, Pain management   PT Home Exercise Plan Continue with current   Consulted and Agree with Plan of Care Patient      Patient will benefit from skilled therapeutic intervention in order to improve the following deficits and impairments:  Abnormal gait, Decreased balance, Decreased activity tolerance, Decreased mobility, Decreased endurance, Decreased coordination, Decreased range of motion, Decreased strength, Hypomobility, Difficulty walking, Increased edema, Impaired flexibility, Impaired tone, Improper body mechanics, Pain  Visit Diagnosis: Difficulty in walking, not elsewhere classified  Acute pain of left knee  Localized edema  Stiffness of left knee, not elsewhere classified     Problem List Patient Active Problem List   Diagnosis Date Noted  . S/P total knee replacement using cement, left 08/01/2016  . RBBB (right bundle branch block with left anterior fascicular block) 04/12/2016  . PVC's (premature ventricular contractions) 04/12/2016  . Chest wall pain 04/12/2016  . Bronchial asthma 06/29/2014  . Urinary retention due to benign prostatic hyperplasia 04/03/2014  . Ileus, postoperative ([place]) 04/03/2014  . Osteoarthritis of right knee 04/01/2014  . S/P total knee replacement using cement 03/31/2014  . Skin rash 04/11/2013  . Pseudogout of knee  12/12/2012  . Dyspepsia 08/14/2012  . Trochanteric Bursitis of right hip 07/18/2011  . Benign hypertensive heart disease without heart failure 12/13/2010  . BPH (benign prostatic hyperplasia) 12/13/2010  . Unilateral primary osteoarthritis, left knee 12/13/2010  . Dyslipidemia 12/13/2010  . Status post cholecystectomy 12/13/2010    Toy Baker, SPT 08/21/2016, 11:48 AM  [place], [place], [address] Phone: [phone]   Fax:  [phone]  Name: TYMIER ARAVE MRN:  [id number] Date of Birth: 1935/01/11

## 2016-08-21 NOTE — Progress Notes (Signed)
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  No orders of the defined types were placed in this encounter.     Procedures: No procedures performed   Clinical Data: No additional findings.   Subjective: No chief complaint on file.   Pt having left knee swelling and possible fluid on left knee    Review of Systems   Objective: Vital Signs: There were no vitals taken for this visit.  Physical Exam  Ortho Exam left knee exam reveals positive hemarthrosis. There is full extension and overall 105 of flexion there is no instability. The wound is healed very nicely is no swelling distally no calf pain    There is enough effusion that is tight and I think it's worth aspirating it  Specialty Comments:  No specialty comments available.  Imaging: No results found.   PMFS History: Patient Active Problem List   Diagnosis Date Noted  . S/P total knee replacement using cement, left 08/01/2016  . RBBB (right bundle branch block with left anterior fascicular block) 04/12/2016  . PVC's (premature ventricular contractions) 04/12/2016  . Chest wall pain 04/12/2016  . Bronchial asthma 06/29/2014  . Urinary retention due to benign prostatic hyperplasia 04/03/2014  . Ileus, postoperative  ([place]) 04/03/2014  . Osteoarthritis of right knee 04/01/2014  . S/P total knee replacement using cement 03/31/2014  . Skin rash 04/11/2013  . Pseudogout of knee 12/12/2012  . Dyspepsia 08/14/2012  . Trochanteric Bursitis of right hip 07/18/2011  . Benign hypertensive heart disease without heart failure 12/13/2010  . BPH (benign prostatic hyperplasia) 12/13/2010  . Unilateral primary osteoarthritis, left knee 12/13/2010  . Dyslipidemia 12/13/2010  . Status post cholecystectomy 12/13/2010   Past Medical History:  Diagnosis Date  . Arthritis   . Asthma    only if develops cold  . Dysrhythmia    h/o pvc's  . GERD (gastroesophageal reflux disease)   . Kidney stone on left side   . PVC (premature ventricular contraction)   . Recurrent upper respiratory infection (URI) 03/2011    Family History  Problem Relation Age of Onset  . Heart disease Mother   . Heart attack Mother   . Anesthesia problems Neg Hx   . Hypotension Neg Hx   . Malignant hyperthermia Neg Hx   . Pseudochol deficiency Neg Hx     Past Surgical History:  Procedure Laterality Date  . APPENDECTOMY    . BACK SURGERY     x 4  . CARDIAC CATHETERIZATION     july 2010-minor irregl  . CERVICAL SPINE SURGERY     x 2  .  CHOLECYSTECTOMY     2008  . EYE SURGERY     bilat cataract  . INCISION AND DRAINAGE HIP  07/18/2011   Procedure: IRRIGATION AND DEBRIDEMENT HIP;  Surgeon: Garald Balding, MD;  Location: [facility];  Service: Orthopedics;  Laterality: Right;  RIGHT HIP EXPLORATION, EXCISION OF DEEP BURSAL SAC  . KIDNEY STONE SURGERY  2011   R  . KNEE ARTHROSCOPY     Left  . SHOULDER ARTHROSCOPY     Left  . TOTAL HIP ARTHROPLASTY     Right  . TOTAL HIP REVISION     Right  . TOTAL KNEE ARTHROPLASTY Right 03/31/2014   Procedure: TOTAL KNEE ARTHROPLASTY;  Surgeon: Garald Balding, MD;  Location: [facility];  Service: Orthopedics;  Laterality: Right;  . TOTAL KNEE ARTHROPLASTY Left 08/01/2016   Procedure: TOTAL KNEE  ARTHROPLASTY;  Surgeon: Garald Balding, MD;  Location: [facility];  Service: Orthopedics;  Laterality: Left;  . WRIST SURGERY     bilateral, plates   Social History   Occupational History  . Not on file.   Social History Main Topics  . Smoking status: Never Smoker  . Smokeless tobacco: Never Used  . Alcohol use No  . Drug use: No  . Sexual activity: Not on file

## 2016-08-23 ENCOUNTER — Ambulatory Visit: Payer: Medicare Other | Admitting: Physical Therapy

## 2016-08-23 ENCOUNTER — Encounter: Payer: Self-pay | Admitting: Physical Therapy

## 2016-08-23 DIAGNOSIS — R600 Localized edema: Secondary | ICD-10-CM | POA: Diagnosis not present

## 2016-08-23 DIAGNOSIS — M25562 Pain in left knee: Secondary | ICD-10-CM

## 2016-08-23 DIAGNOSIS — M25662 Stiffness of left knee, not elsewhere classified: Secondary | ICD-10-CM

## 2016-08-23 DIAGNOSIS — R262 Difficulty in walking, not elsewhere classified: Secondary | ICD-10-CM | POA: Diagnosis not present

## 2016-08-23 NOTE — Therapy (Signed)
[place], [place], [address] Phone: [phone]   Fax:  [phone]  Physical Therapy Treatment  Patient Details  Name: [name] MRN: [id number] Date of Birth: [birth year] Referring Provider: Dr. Durward Fortes  Encounter Date: 08/23/2016      PT End of Session - 08/23/16 1143    Visit Number 3   Date for PT Re-Evaluation 10/11/16   PT Start Time 1055   PT Stop Time 1150   PT Time Calculation (min) 55 min   Activity Tolerance Patient tolerated treatment well   Behavior During Therapy [facility] for tasks assessed/performed      Past Medical History:  Diagnosis Date  . Arthritis   . Asthma    only if develops cold  . Dysrhythmia    h/o pvc's  . GERD (gastroesophageal reflux disease)   . Kidney stone on left side   . PVC (premature ventricular contraction)   . Recurrent upper respiratory infection (URI) 03/2011    Past Surgical History:  Procedure Laterality Date  . APPENDECTOMY    . BACK SURGERY     x 4  . CARDIAC CATHETERIZATION     july 2010-minor irregl  . CERVICAL SPINE SURGERY     x 2  . CHOLECYSTECTOMY     2008  . EYE SURGERY     bilat cataract  . INCISION AND DRAINAGE HIP  07/18/2011   Procedure: IRRIGATION AND DEBRIDEMENT HIP;  Surgeon: Garald Balding, MD;  Location: [facility];  Service: Orthopedics;  Laterality: Right;  RIGHT HIP EXPLORATION, EXCISION OF DEEP BURSAL SAC  . KIDNEY STONE SURGERY  2011   R  . KNEE ARTHROSCOPY     Left  . SHOULDER ARTHROSCOPY     Left  . TOTAL HIP ARTHROPLASTY     Right  . TOTAL HIP REVISION     Right  . TOTAL KNEE ARTHROPLASTY Right 03/31/2014   Procedure: TOTAL KNEE ARTHROPLASTY;  Surgeon: Garald Balding, MD;  Location: [facility];  Service: Orthopedics;  Laterality: Right;  . TOTAL KNEE ARTHROPLASTY Left 08/01/2016   Procedure: TOTAL KNEE ARTHROPLASTY;  Surgeon: Garald Balding, MD;  Location: [facility];  Service: Orthopedics;   Laterality: Left;  . WRIST SURGERY     bilateral, plates    There were no vitals filed for this visit.      Subjective Assessment - 08/23/16 1059    Subjective Pt reports that the doctor took fluid off of the knee during his visit Monday. He states that he feels like the knee is a little looser now.   Currently in Pain? Yes   Pain Score 5    Pain Location Knee                         [facility] Adult PT Treatment/Exercise - 08/23/16 0001      Ambulation/Gait   Ambulation/Gait Yes   Ambulation/Gait Assistance 7: Independent   Ambulation Distance (Feet) 300 Feet   Gait Pattern Step-through pattern   Gait Comments PT held right hand in order to increase gait speed      Knee/Hip Exercises: Stretches   Gastroc Stretch 2 reps;30 seconds;Left     Knee/Hip Exercises: Aerobic   Nustep 6 min, lvl 6     Knee/Hip Exercises: Machines for Strengthening   Cybex Knee Extension 10 lb, 5 lb 2x10   Cybex Knee Flexion 25 lb, 2x15   Cybex  Leg Press 20 lb, lvl 9 2x10     Knee/Hip Exercises: Standing   Forward Step Up 2 sets;15 reps;Left;Hand Hold: 2;Step Height: 6"     Knee/Hip Exercises: Supine   Knee Flexion Strengthening;Left;2 sets;20 reps  red ball     Modalities   Modalities Electrical Stimulation;Vasopneumatic     Electrical Stimulation   Electrical Stimulation Location left knee   Electrical Stimulation Action IFC   Electrical Stimulation Parameters supine   Electrical Stimulation Goals Edema;Pain     Vasopneumatic   Number Minutes Vasopneumatic  15 minutes   Vasopnuematic Location  Knee   Vasopneumatic Pressure Medium   Vasopneumatic Temperature  32     Manual Therapy   Manual Therapy Joint mobilization;Passive ROM;Soft tissue mobilization   Joint Mobilization AP jt grade 3 mobilization femur on tibia    Soft tissue mobilization Patella mobility   Passive ROM HS, gastroc, SKC                PT Education - 08/23/16 1143    Education provided No           PT Short Term Goals - 08/17/16 1425      PT SHORT TERM GOAL #1   Status Achieved           PT Long Term Goals - 08/17/16 1425      PT LONG TERM GOAL #1   Status On-going     PT LONG TERM GOAL #2   Status On-going     PT LONG TERM GOAL #3   Status Unable to assess     PT LONG TERM GOAL #4   Status On-going               Plan - 08/23/16 1143    Clinical Impression Statement Pt tolerated treatment well and was able to tolerate treatment despite discomfort with manual therapy. Pt is progressing well. AMbulation was performed today without any AD and tactile cues to speed up gait speed. Pt's eft knee "buckled" twice during the exercise however he was able to continue. Progress per pt tolerance.   Rehab Potential Good   PT Frequency 3x / week   PT Duration 8 weeks   PT Treatment/Interventions Vasopneumatic Device;Energy conservation;Manual techniques;Compression bandaging;Passive range of motion;Patient/family education;Neuromuscular re-education;Balance training;Therapeutic exercise;Therapeutic activities;Moist Heat;Ultrasound;Gait training;Stair training;Functional mobility training;Electrical Stimulation;Cryotherapy;ADLs/Self Care Home Management   PT Next Visit Plan seated LE strenghtening, manual therapy, Pain management, gait speed   Consulted and Agree with Plan of Care Patient      Patient will benefit from skilled therapeutic intervention in order to improve the following deficits and impairments:  Abnormal gait, Decreased balance, Decreased activity tolerance, Decreased mobility, Decreased endurance, Decreased coordination, Decreased range of motion, Decreased strength, Hypomobility, Difficulty walking, Increased edema, Impaired flexibility, Impaired tone, Improper body mechanics, Pain  Visit Diagnosis: Difficulty in walking, not elsewhere classified  Acute pain of left knee  Localized edema  Stiffness of left knee, not elsewhere  classified     Problem List Patient Active Problem List   Diagnosis Date Noted  . S/P total knee replacement using cement, left 08/01/2016  . RBBB (right bundle branch block with left anterior fascicular block) 04/12/2016  . PVC's (premature ventricular contractions) 04/12/2016  . Chest wall pain 04/12/2016  . Bronchial asthma 06/29/2014  . Urinary retention due to benign prostatic hyperplasia 04/03/2014  . Ileus, postoperative ([place]) 04/03/2014  . Osteoarthritis of right knee 04/01/2014  . S/P total knee replacement using cement 03/31/2014  .  Skin rash 04/11/2013  . Pseudogout of knee 12/12/2012  . Dyspepsia 08/14/2012  . Trochanteric Bursitis of right hip 07/18/2011  . Benign hypertensive heart disease without heart failure 12/13/2010  . BPH (benign prostatic hyperplasia) 12/13/2010  . Unilateral primary osteoarthritis, left knee 12/13/2010  . Dyslipidemia 12/13/2010  . Status post cholecystectomy 12/13/2010    Toy Baker, SPT 08/23/2016, 11:47 AM  [place], [place], [address] Phone: [phone]   Fax:  [phone]  Name: [name] MRN: [id number] Date of Birth: [birth year]

## 2016-08-25 ENCOUNTER — Ambulatory Visit: Payer: Medicare Other | Admitting: Physical Therapy

## 2016-08-25 ENCOUNTER — Encounter: Payer: Self-pay | Admitting: Physical Therapy

## 2016-08-25 DIAGNOSIS — R262 Difficulty in walking, not elsewhere classified: Secondary | ICD-10-CM

## 2016-08-25 DIAGNOSIS — M25662 Stiffness of left knee, not elsewhere classified: Secondary | ICD-10-CM

## 2016-08-25 DIAGNOSIS — M25562 Pain in left knee: Secondary | ICD-10-CM | POA: Diagnosis not present

## 2016-08-25 DIAGNOSIS — R600 Localized edema: Secondary | ICD-10-CM

## 2016-08-25 NOTE — Therapy (Signed)
[place], [place], [address] Phone: [phone]   Fax:  [phone]  Physical Therapy Treatment  Patient Details  Name: [name] MRN: [id number] Date of Birth: 1935-03-20 Referring Provider: Dr. Durward Fortes  Encounter Date: 08/25/2016      PT End of Session - 08/25/16 1137    Visit Number 4   Date for PT Re-Evaluation 10/11/16   PT Start Time 1100   PT Stop Time 1150   PT Time Calculation (min) 50 min   Equipment Utilized During Treatment Other (comment)   Activity Tolerance Patient tolerated treatment well   Behavior During Therapy [facility] for tasks assessed/performed      Past Medical History:  Diagnosis Date  . Arthritis   . Asthma    only if develops cold  . Dysrhythmia    h/o pvc's  . GERD (gastroesophageal reflux disease)   . Kidney stone on left side   . PVC (premature ventricular contraction)   . Recurrent upper respiratory infection (URI) 03/2011    Past Surgical History:  Procedure Laterality Date  . APPENDECTOMY    . BACK SURGERY     x 4  . CARDIAC CATHETERIZATION     july 2010-minor irregl  . CERVICAL SPINE SURGERY     x 2  . CHOLECYSTECTOMY     2008  . EYE SURGERY     bilat cataract  . INCISION AND DRAINAGE HIP  07/18/2011   Procedure: IRRIGATION AND DEBRIDEMENT HIP;  Surgeon: Garald Balding, MD;  Location: [facility];  Service: Orthopedics;  Laterality: Right;  RIGHT HIP EXPLORATION, EXCISION OF DEEP BURSAL SAC  . KIDNEY STONE SURGERY  2011   R  . KNEE ARTHROSCOPY     Left  . SHOULDER ARTHROSCOPY     Left  . TOTAL HIP ARTHROPLASTY     Right  . TOTAL HIP REVISION     Right  . TOTAL KNEE ARTHROPLASTY Right 03/31/2014   Procedure: TOTAL KNEE ARTHROPLASTY;  Surgeon: Garald Balding, MD;  Location: [place];  Service: Orthopedics;  Laterality: Right;  . TOTAL KNEE ARTHROPLASTY Left 08/01/2016   Procedure: TOTAL KNEE ARTHROPLASTY;  Surgeon: Garald Balding,  MD;  Location: [facility];  Service: Orthopedics;  Laterality: Left;  . WRIST SURGERY     bilateral, plates    There were no vitals filed for this visit.      Subjective Assessment - 08/25/16 1100    Subjective Pt reports he is feeling "tight" this morning and does not know why. He reports that he has continued to perfrom HEP 3x/day.   Currently in Pain? Yes   Pain Score 4                          [facility] Adult PT Treatment/Exercise - 08/25/16 0001      Knee/Hip Exercises: Aerobic   Stationary Bike [employer] 5 minutes full revolutions   Nustep 6 minutes lvl 4     Knee/Hip Exercises: Machines for Strengthening   Cybex Knee Extension 15 lb 2x10   Cybex Knee Flexion 35 lb, 2x10, 45 lb x10   Cybex Leg Press 40 lb, lvl 9, 2x10.  20 lb, left leg only lvl 10 x10     Knee/Hip Exercises: Standing   Heel Raises 1 set;20 reps;Both  up on toes back on heels   Lateral Step Up Left;1 set;15 reps;Hand Hold: 1;Step Height: 6"  Knee/Hip Exercises: Supine   Heel Slides Strengthening;Left;1 set;20 reps  red phys ball   Other Supine Knee/Hip Exercises bridges x20     Modalities   Modalities Electrical Stimulation;Vasopneumatic     Electrical Stimulation   Electrical Stimulation Location left knee   Electrical Stimulation Action IFC   Electrical Stimulation Parameters supine   Electrical Stimulation Goals Edema;Pain     Vasopneumatic   Number Minutes Vasopneumatic  10 minutes   Vasopnuematic Location  Knee   Vasopneumatic Pressure Medium   Vasopneumatic Temperature  32     Manual Therapy   Manual Therapy Joint mobilization;Passive ROM   Joint Mobilization AP jt mobilization femur on tibia, patella mobilization   Passive ROM HS, SKC stretch                PT Education - 08/25/16 1137    Education provided No          PT Short Term Goals - 08/17/16 1425      PT SHORT TERM GOAL #1   Status Achieved           PT Long Term Goals - 08/25/16 1140       PT LONG TERM GOAL #1   Status On-going     PT LONG TERM GOAL #2   Status On-going     PT LONG TERM GOAL #3   Status Unable to assess     PT LONG TERM GOAL #4   Status On-going               Plan - 08/25/16 1138    Clinical Impression Statement Pt tolerated treatmetn well and was able to complete all exercises. Pt continues to push himself and is very motivated to get better however he was instructed to decrease daily HEP workouts to 2x/day due tot he fact that the patient  continues to report feeling stiffness. Pt was instructed to take out the midday workout and rest during this time. Progress per pt tolerance.   Rehab Potential Good   PT Frequency 3x / week   PT Duration 8 weeks   PT Treatment/Interventions Vasopneumatic Device;Energy conservation;Manual techniques;Compression bandaging;Passive range of motion;Patient/family education;Neuromuscular re-education;Balance training;Therapeutic exercise;Therapeutic activities;Moist Heat;Ultrasound;Gait training;Stair training;Functional mobility training;Electrical Stimulation;Cryotherapy;ADLs/Self Care Home Management   PT Next Visit Plan Standing LE strenghtening, manual therapy, Pain management, gait speed   Consulted and Agree with Plan of Care Patient      Patient will benefit from skilled therapeutic intervention in order to improve the following deficits and impairments:  Abnormal gait, Decreased balance, Decreased activity tolerance, Decreased mobility, Decreased endurance, Decreased coordination, Decreased range of motion, Decreased strength, Hypomobility, Difficulty walking, Increased edema, Impaired flexibility, Impaired tone, Improper body mechanics, Pain  Visit Diagnosis: Difficulty in walking, not elsewhere classified  Acute pain of left knee  Localized edema  Stiffness of left knee, not elsewhere classified     Problem List Patient Active Problem List   Diagnosis Date Noted  . S/P total knee  replacement using cement, left 08/01/2016  . RBBB (right bundle branch block with left anterior fascicular block) 04/12/2016  . PVC's (premature ventricular contractions) 04/12/2016  . Chest wall pain 04/12/2016  . Bronchial asthma 06/29/2014  . Urinary retention due to benign prostatic hyperplasia 04/03/2014  . Ileus, postoperative ([place]) 04/03/2014  . Osteoarthritis of right knee 04/01/2014  . S/P total knee replacement using cement 03/31/2014  . Skin rash 04/11/2013  . Pseudogout of knee 12/12/2012  . Dyspepsia 08/14/2012  . Trochanteric Bursitis  of right hip 07/18/2011  . Benign hypertensive heart disease without heart failure 12/13/2010  . BPH (benign prostatic hyperplasia) 12/13/2010  . Unilateral primary osteoarthritis, left knee 12/13/2010  . Dyslipidemia 12/13/2010  . Status post cholecystectomy 12/13/2010    Toy Baker, SPT 08/25/2016, 11:41 AM  [place], [place], [address] Phone: [phone]   Fax:  [phone]  Name: AIJALON WINGETT MRN: [id number] Date of Birth: [birth year]

## 2016-08-28 ENCOUNTER — Encounter (HOSPITAL_BASED_OUTPATIENT_CLINIC_OR_DEPARTMENT_OTHER): Payer: Self-pay

## 2016-08-28 ENCOUNTER — Emergency Department (HOSPITAL_BASED_OUTPATIENT_CLINIC_OR_DEPARTMENT_OTHER)
Admission: EM | Admit: 2016-08-28 | Discharge: 2016-08-28 | Disposition: A | Discharge status: Home / Self Care | Payer: Medicare Other | Attending: Emergency Medicine | Admitting: Emergency Medicine

## 2016-08-28 DIAGNOSIS — M2550 Pain in unspecified joint: Secondary | ICD-10-CM | POA: Diagnosis not present

## 2016-08-28 DIAGNOSIS — J45909 Unspecified asthma, uncomplicated: Secondary | ICD-10-CM | POA: Insufficient documentation

## 2016-08-28 DIAGNOSIS — Z7982 Long term (current) use of aspirin: Secondary | ICD-10-CM | POA: Diagnosis not present

## 2016-08-28 DIAGNOSIS — J029 Acute pharyngitis, unspecified: Secondary | ICD-10-CM | POA: Insufficient documentation

## 2016-08-28 DIAGNOSIS — Z79899 Other long term (current) drug therapy: Secondary | ICD-10-CM | POA: Insufficient documentation

## 2016-08-28 DIAGNOSIS — Z96659 Presence of unspecified artificial knee joint: Secondary | ICD-10-CM | POA: Diagnosis not present

## 2016-08-28 LAB — RAPID STREP SCREEN (MED CTR MEBANE ONLY): Streptococcus, Group A Screen (Direct): NEGATIVE

## 2016-08-28 MED ORDER — LIDOCAINE VISCOUS 2 % MT SOLN
15.0000 mL | Freq: Once | OROMUCOSAL | Status: AC
  Administered 2016-08-28: 15 mL via OROMUCOSAL
  Filled 2016-08-28: qty 15

## 2016-08-28 NOTE — ED Triage Notes (Signed)
C/o sore throat since 430am-NAD-steady gait with own cane

## 2016-08-28 NOTE — ED Provider Notes (Signed)
[facility] Provider Note   CSN: [id number] Arrival date & time: 08/28/16  1133     History   Chief Complaint Chief Complaint  Patient presents with  . Sore Throat    HPI [name] is a 81 y.o. male.  HPI   Sore throat, white splotches on back of throat, swollen lymph nodes, began 4AM this morning Sore throat severe No known fevers Pain with swallowing but no difficulty doing so  No known sick contacts, 2 little girls at church visited day before yest for 1hr, not sick  Past Medical History:  Diagnosis Date  . Arthritis   . Asthma    only if develops cold  . Dysrhythmia    h/o pvc's  . GERD (gastroesophageal reflux disease)   . Kidney stone on left side   . PVC (premature ventricular contraction)   . Recurrent upper respiratory infection (URI) 03/2011    Patient Active Problem List   Diagnosis Date Noted  . S/P total knee replacement using cement, left 08/01/2016  . RBBB (right bundle branch block with left anterior fascicular block) 04/12/2016  . PVC's (premature ventricular contractions) 04/12/2016  . Chest wall pain 04/12/2016  . Bronchial asthma 06/29/2014  . Urinary retention due to benign prostatic hyperplasia 04/03/2014  . Ileus, postoperative ([place]) 04/03/2014  . Osteoarthritis of right knee 04/01/2014  . S/P total knee replacement using cement 03/31/2014  . Skin rash 04/11/2013  . Pseudogout of knee 12/12/2012  . Dyspepsia 08/14/2012  . Trochanteric Bursitis of right hip 07/18/2011  . Benign hypertensive heart disease without heart failure 12/13/2010  . BPH (benign prostatic hyperplasia) 12/13/2010  . Unilateral primary osteoarthritis, left knee 12/13/2010  . Dyslipidemia 12/13/2010  . Status post cholecystectomy 12/13/2010    Past Surgical History:  Procedure Laterality Date  . APPENDECTOMY    . BACK SURGERY     x 4  . CARDIAC CATHETERIZATION     july 2010-minor irregl  . CERVICAL SPINE SURGERY     x 2  .  CHOLECYSTECTOMY     2008  . EYE SURGERY     bilat cataract  . INCISION AND DRAINAGE HIP  07/18/2011   Procedure: IRRIGATION AND DEBRIDEMENT HIP;  Surgeon: Garald Balding, MD;  Location: [facility];  Service: Orthopedics;  Laterality: Right;  RIGHT HIP EXPLORATION, EXCISION OF DEEP BURSAL SAC  . KIDNEY STONE SURGERY  2011   R  . KNEE ARTHROSCOPY     Left  . SHOULDER ARTHROSCOPY     Left  . TOTAL HIP ARTHROPLASTY     Right  . TOTAL HIP REVISION     Right  . TOTAL KNEE ARTHROPLASTY Right 03/31/2014   Procedure: TOTAL KNEE ARTHROPLASTY;  Surgeon: Garald Balding, MD;  Location: [facility];  Service: Orthopedics;  Laterality: Right;  . TOTAL KNEE ARTHROPLASTY Left 08/01/2016   Procedure: TOTAL KNEE ARTHROPLASTY;  Surgeon: Garald Balding, MD;  Location: [facility];  Service: Orthopedics;  Laterality: Left;  . WRIST SURGERY     bilateral, plates       Home Medications    Prior to Admission medications   Medication Sig Start Date End Date Taking? Authorizing Provider  albuterol (PROVENTIL HFA;VENTOLIN HFA) 108 (90 BASE) MCG/ACT inhaler Inhale 2 puffs into the lungs every 6 (six) hours as needed for wheezing or shortness of breath. 06/29/14   Darlin Coco, MD  Ascorbic Acid (VITAMIN C) 500 MG tablet Take 1 tablet (500 mg total) by mouth daily. 12/13/10  Darlin Coco, MD  aspirin 325 MG tablet Take 1 tablet (325 mg total) by mouth 2 (two) times daily. 08/03/16   Cherylann Ratel, PA-C  methocarbamol (ROBAXIN) 500 MG tablet Take 1 tablet (500 mg total) by mouth every 8 (eight) hours as needed for muscle spasms. 08/02/16   Cherylann Ratel, PA-C  metoprolol tartrate (LOPRESSOR) 25 MG tablet Take 1 tablet (25 mg total) by mouth daily. 02/25/16   Pixie Casino, MD  oxyCODONE (OXY IR/ROXICODONE) 5 MG immediate release tablet Take 1-2 tablets (5-10 mg total) by mouth every 3 (three) hours as needed for breakthrough pain. 08/10/16   Garald Balding, MD  pantoprazole (PROTONIX) 40 MG tablet Take  1 tablet (40 mg total) by mouth daily. 02/25/16   Pixie Casino, MD  potassium chloride SA (K-DUR,KLOR-CON) 20 MEQ tablet Take 1 tablet (20 mEq total) by mouth daily. 02/25/16   Pixie Casino, MD  rivaroxaban (XARELTO) 10 MG TABS tablet Take 1 tablet (10 mg total) by mouth daily with breakfast. 08/02/16   Cherylann Ratel, PA-C    Family History Family History  Problem Relation Age of Onset  . Heart disease Mother   . Heart attack Mother   . Anesthesia problems Neg Hx   . Hypotension Neg Hx   . Malignant hyperthermia Neg Hx   . Pseudochol deficiency Neg Hx     Social History Social History  Substance Use Topics  . Smoking status: Never Smoker  . Smokeless tobacco: Never Used  . Alcohol use No     Allergies   Tape; Erythromycin; Sulfa antibiotics; and Tetracyclines & related   Review of Systems Review of Systems  Constitutional: Positive for fatigue. Negative for fever.  HENT: Positive for sore throat. Negative for congestion and trouble swallowing (pain). Voice change: little bit hoarse.   Respiratory: Negative for cough and shortness of breath.   Cardiovascular: Negative for chest pain.  Gastrointestinal: Negative for abdominal pain, nausea and vomiting.  Genitourinary: Negative for dysuria.  Musculoskeletal: Positive for arthralgias (knee replaced 4wk ago). Negative for back pain.  Skin: Negative for rash.  Neurological: Negative for light-headedness and headaches.     Physical Exam Updated Vital Signs BP 134/63 (BP Location: Right Arm)   Pulse 66   Temp 97.8 F (36.6 C) (Oral)   Resp 16   Ht 6\' 4"  (1.93 m)   Wt 230 lb (104.3 kg)   SpO2 96%   BMI 28.00 kg/m   Physical Exam  Constitutional: He is oriented to person, place, and time. He appears well-developed and well-nourished. No distress.  HENT:  Head: Normocephalic and atraumatic.  Mouth/Throat: Oropharyngeal exudate present.  Eyes: Conjunctivae and EOM are normal.  Neck: Normal range of motion.    Cardiovascular: Normal rate, regular rhythm, normal heart sounds and intact distal pulses.  Exam reveals no gallop and no friction rub.   No murmur heard. Pulmonary/Chest: Effort normal and breath sounds normal. No respiratory distress. He has no wheezes. He has no rales.  Abdominal: Soft. He exhibits no distension. There is no tenderness. There is no guarding.  Musculoskeletal: He exhibits no edema.  Lymphadenopathy:    He has cervical adenopathy.  Neurological: He is alert and oriented to person, place, and time.  Skin: Skin is warm and dry. He is not diaphoretic.  Nursing note and vitals reviewed.    ED Treatments / Results  Labs (all labs ordered are listed, but only abnormal results are displayed) Labs Reviewed  RAPID  STREP SCREEN (NOT AT [facility])  CULTURE, GROUP A STREP [facility])    EKG  EKG Interpretation None       Radiology No results found.  Procedures Procedures (including critical care time)  Medications Ordered in ED Medications  lidocaine (XYLOCAINE) 2 % viscous mouth solution 15 mL (15 mLs Mouth/Throat Given 08/28/16 1238)     Initial Impression / Assessment and Plan / ED Course  I have reviewed the triage vital signs and the nursing notes.  Pertinent labs & imaging results that were available during my care of the patient were reviewed by me and considered in my medical decision making (see chart for details).  Clinical Course    81 year old male with history of knee replacement, dyslipidemia, presents with concern for sore throat beginning this morning. Patient with tonsillar exudates, lymphadenopathy. Strep screen was done which was negative and culture sent. Suspect viral pharyngitis, possible mono or other. Pt well appearing, no sign of epiglottitis, RPA, PTA.  Recommend supportive care. Patient discharged in stable condition with understanding of reasons to return.   Final Clinical Impressions(s) / ED Diagnoses   Final diagnoses:  Acute  pharyngitis, unspecified etiology    New Prescriptions Discharge Medication List as of 08/28/2016  1:18 PM       Gareth Morgan, MD 08/28/16 2001

## 2016-08-28 NOTE — ED Notes (Signed)
ED Provider at bedside. 

## 2016-08-30 ENCOUNTER — Encounter: Payer: Self-pay | Admitting: Physical Therapy

## 2016-08-30 ENCOUNTER — Ambulatory Visit: Payer: Medicare Other | Admitting: Physical Therapy

## 2016-08-30 DIAGNOSIS — M25562 Pain in left knee: Secondary | ICD-10-CM | POA: Diagnosis not present

## 2016-08-30 DIAGNOSIS — M25662 Stiffness of left knee, not elsewhere classified: Secondary | ICD-10-CM | POA: Diagnosis not present

## 2016-08-30 DIAGNOSIS — R262 Difficulty in walking, not elsewhere classified: Secondary | ICD-10-CM | POA: Diagnosis not present

## 2016-08-30 DIAGNOSIS — R600 Localized edema: Secondary | ICD-10-CM | POA: Diagnosis not present

## 2016-08-30 LAB — CULTURE, GROUP A STREP (THRC)

## 2016-08-30 NOTE — Therapy (Signed)
[place], [place], [address] Phone: [phone]   Fax:  [phone]  Physical Therapy Treatment  Patient Details  Name: [name] MRN: [id number] Date of Birth: July 18, 1935 Referring Provider: Dr. Durward Fortes  Encounter Date: 08/30/2016      PT End of Session - 08/30/16 0917    Visit Number 5   Date for PT Re-Evaluation 10/11/16   PT Start Time 0843   PT Stop Time 0940   PT Time Calculation (min) 57 min   Activity Tolerance Patient tolerated treatment well   Behavior During Therapy [facility] for tasks assessed/performed      Past Medical History:  Diagnosis Date  . Arthritis   . Asthma    only if develops cold  . Dysrhythmia    h/o pvc's  . GERD (gastroesophageal reflux disease)   . Kidney stone on left side   . PVC (premature ventricular contraction)   . Recurrent upper respiratory infection (URI) 03/2011    Past Surgical History:  Procedure Laterality Date  . APPENDECTOMY    . BACK SURGERY     x 4  . CARDIAC CATHETERIZATION     july 2010-minor irregl  . CERVICAL SPINE SURGERY     x 2  . CHOLECYSTECTOMY     2008  . EYE SURGERY     bilat cataract  . INCISION AND DRAINAGE HIP  07/18/2011   Procedure: IRRIGATION AND DEBRIDEMENT HIP;  Surgeon: Garald Balding, MD;  Location: [facility];  Service: Orthopedics;  Laterality: Right;  RIGHT HIP EXPLORATION, EXCISION OF DEEP BURSAL SAC  . KIDNEY STONE SURGERY  2011   R  . KNEE ARTHROSCOPY     Left  . SHOULDER ARTHROSCOPY     Left  . TOTAL HIP ARTHROPLASTY     Right  . TOTAL HIP REVISION     Right  . TOTAL KNEE ARTHROPLASTY Right 03/31/2014   Procedure: TOTAL KNEE ARTHROPLASTY;  Surgeon: Garald Balding, MD;  Location: [facility];  Service: Orthopedics;  Laterality: Right;  . TOTAL KNEE ARTHROPLASTY Left 08/01/2016   Procedure: TOTAL KNEE ARTHROPLASTY;  Surgeon: Garald Balding, MD;  Location: [facility];  Service: Orthopedics;   Laterality: Left;  . WRIST SURGERY     bilateral, plates    There were no vitals filed for this visit.      Subjective Assessment - 08/30/16 0844    Subjective Pt states he is doing good. He reports he was able to do his HEPs over the holiday break however he has cut back to 2x/day.   Currently in Pain? Yes   Pain Score 4    Pain Location Knee   Pain Orientation Left;Medial                         [facility] Adult PT Treatment/Exercise - 08/30/16 0001      Ambulation/Gait   Ambulation/Gait Yes   Ambulation/Gait Assistance Other (comment)  guided to increase gait speed and decrease limp   Ambulation Distance (Feet) 300 Feet   Gait Pattern Step-through pattern   Gait Comments holding right hand to speed up gait     Knee/Hip Exercises: Aerobic   Nustep 6 min, lvl 5     Knee/Hip Exercises: Machines for Strengthening   Cybex Knee Extension 5 lb, x10   Cybex Knee Flexion 45 lb, 2x10   Cybex Leg Press 40 lb x10, 60 lb x10  Knee/Hip Exercises: Supine   Short Arc Quad Sets Strengthening;Left;2 sets;10 reps  hold 5 secs   Heel Prop for Knee Extension 5 minutes;Weight   Heel Prop for Knee Extension Weight (lbs) 5     Modalities   Modalities Vasopneumatic     Vasopneumatic   Number Minutes Vasopneumatic  15 minutes   Vasopnuematic Location  Knee   Vasopneumatic Pressure Medium   Vasopneumatic Temperature  32     Manual Therapy   Manual Therapy Passive ROM   Passive ROM HS stretch, gastroc stretch, SKC quad stretch                  PT Short Term Goals - 08/17/16 1425      PT SHORT TERM GOAL #1   Status Achieved           PT Long Term Goals - 08/25/16 1140      PT LONG TERM GOAL #1   Status On-going     PT LONG TERM GOAL #2   Status On-going     PT LONG TERM GOAL #3   Status Unable to assess     PT LONG TERM GOAL #4   Status On-going               Plan - 08/30/16 0919    Clinical Impression Statement Pt tolerated  treatment well and was able to tolerate all exercises except for one legged knee extension due to medial knee pain. Pt continues to report mdial left knee pain with knee extension and if the knee is flexed past 90 (eg: leg press machine.) Pt progressed knee extension to 2 degrees after supine kne extension with weight. Continue to progress per pt tolerance.    Rehab Potential Good   PT Frequency 3x / week   PT Duration 8 weeks   PT Treatment/Interventions Vasopneumatic Device;Energy conservation;Manual techniques;Compression bandaging;Passive range of motion;Patient/family education;Neuromuscular re-education;Balance training;Therapeutic exercise;Therapeutic activities;Moist Heat;Ultrasound;Gait training;Stair training;Functional mobility training;Electrical Stimulation;Cryotherapy;ADLs/Self Care Home Management   PT Next Visit Plan Standing LE strenghtening, manual therapy, Pain management, gait speed   Consulted and Agree with Plan of Care Patient      Patient will benefit from skilled therapeutic intervention in order to improve the following deficits and impairments:  Abnormal gait, Decreased balance, Decreased activity tolerance, Decreased mobility, Decreased endurance, Decreased coordination, Decreased range of motion, Decreased strength, Hypomobility, Difficulty walking, Increased edema, Impaired flexibility, Impaired tone, Improper body mechanics, Pain  Visit Diagnosis: Difficulty in walking, not elsewhere classified  Acute pain of left knee  Localized edema  Stiffness of left knee, not elsewhere classified     Problem List Patient Active Problem List   Diagnosis Date Noted  . S/P total knee replacement using cement, left 08/01/2016  . RBBB (right bundle branch block with left anterior fascicular block) 04/12/2016  . PVC's (premature ventricular contractions) 04/12/2016  . Chest wall pain 04/12/2016  . Bronchial asthma 06/29/2014  . Urinary retention due to benign prostatic  hyperplasia 04/03/2014  . Ileus, postoperative ([place]) 04/03/2014  . Osteoarthritis of right knee 04/01/2014  . S/P total knee replacement using cement 03/31/2014  . Skin rash 04/11/2013  . Pseudogout of knee 12/12/2012  . Dyspepsia 08/14/2012  . Trochanteric Bursitis of right hip 07/18/2011  . Benign hypertensive heart disease without heart failure 12/13/2010  . BPH (benign prostatic hyperplasia) 12/13/2010  . Unilateral primary osteoarthritis, left knee 12/13/2010  . Dyslipidemia 12/13/2010  . Status post cholecystectomy 12/13/2010    Toy Baker, SPT  08/30/2016, 9:40 AM  [place], [place], [address] Phone: [phone]   Fax:  [phone]  Name: [name] MRN: [id number] Date of Birth: [birth year]

## 2016-09-01 ENCOUNTER — Ambulatory Visit: Payer: Medicare Other | Admitting: Physical Therapy

## 2016-09-01 ENCOUNTER — Encounter: Payer: Self-pay | Admitting: Physical Therapy

## 2016-09-01 DIAGNOSIS — M25662 Stiffness of left knee, not elsewhere classified: Secondary | ICD-10-CM | POA: Diagnosis not present

## 2016-09-01 DIAGNOSIS — M25562 Pain in left knee: Secondary | ICD-10-CM | POA: Diagnosis not present

## 2016-09-01 DIAGNOSIS — R600 Localized edema: Secondary | ICD-10-CM | POA: Diagnosis not present

## 2016-09-01 DIAGNOSIS — R262 Difficulty in walking, not elsewhere classified: Secondary | ICD-10-CM | POA: Diagnosis not present

## 2016-09-01 NOTE — Therapy (Signed)
[place], [place], [address] Phone: [phone]   Fax:  [phone]  Physical Therapy Treatment  Patient Details  Name: [name] MRN: [id number] Date of Birth: Jun 27, 1935 Referring Provider: Dr. Durward Fortes  Encounter Date: 09/01/2016      PT End of Session - 09/01/16 0925    Visit Number 6   Date for PT Re-Evaluation 10/11/16   PT Start Time 0845   PT Stop Time 0935   PT Time Calculation (min) 50 min   Activity Tolerance Patient tolerated treatment well   Behavior During Therapy [facility] for tasks assessed/performed      Past Medical History:  Diagnosis Date  . Arthritis   . Asthma    only if develops cold  . Dysrhythmia    h/o pvc's  . GERD (gastroesophageal reflux disease)   . Kidney stone on left side   . PVC (premature ventricular contraction)   . Recurrent upper respiratory infection (URI) 03/2011    Past Surgical History:  Procedure Laterality Date  . APPENDECTOMY    . BACK SURGERY     x 4  . CARDIAC CATHETERIZATION     july 2010-minor irregl  . CERVICAL SPINE SURGERY     x 2  . CHOLECYSTECTOMY     2008  . EYE SURGERY     bilat cataract  . INCISION AND DRAINAGE HIP  07/18/2011   Procedure: IRRIGATION AND DEBRIDEMENT HIP;  Surgeon: Garald Balding, MD;  Location: [facility];  Service: Orthopedics;  Laterality: Right;  RIGHT HIP EXPLORATION, EXCISION OF DEEP BURSAL SAC  . KIDNEY STONE SURGERY  2011   R  . KNEE ARTHROSCOPY     Left  . SHOULDER ARTHROSCOPY     Left  . TOTAL HIP ARTHROPLASTY     Right  . TOTAL HIP REVISION     Right  . TOTAL KNEE ARTHROPLASTY Right 03/31/2014   Procedure: TOTAL KNEE ARTHROPLASTY;  Surgeon: Garald Balding, MD;  Location: [facility];  Service: Orthopedics;  Laterality: Right;  . TOTAL KNEE ARTHROPLASTY Left 08/01/2016   Procedure: TOTAL KNEE ARTHROPLASTY;  Surgeon: Garald Balding, MD;  Location: [facility];  Service: Orthopedics;   Laterality: Left;  . WRIST SURGERY     bilateral, plates    There were no vitals filed for this visit.      Subjective Assessment - 09/01/16 0845    Subjective Pt states he is doing well. He reports after Wednesdays tretament the knee swelled up some however it went back down after rest and ice.   Currently in Pain? Yes   Pain Score 3                          [facility] Adult PT Treatment/Exercise - 09/01/16 0001      Knee/Hip Exercises: Aerobic   Recumbent Bike 6 minutes full revs   Nustep 6 min, lvl 7     Knee/Hip Exercises: Machines for Strengthening   Cybex Knee Extension 10#, 2x10, 5# x10   Cybex Knee Flexion 45# 2x10     Knee/Hip Exercises: Supine   Short Arc Quad Sets Strengthening;Left;2 sets;20 reps  5 lb ankle weight     Modalities   Modalities Vasopneumatic;Electrical Stimulation     Electrical Stimulation   Electrical Stimulation Location left knee   Electrical Stimulation Action IFC   Electrical Stimulation Parameters supine   Electrical Stimulation Goals Pain  Vasopneumatic   Number Minutes Vasopneumatic  15 minutes   Vasopnuematic Location  Knee   Vasopneumatic Pressure Medium   Vasopneumatic Temperature  32     Manual Therapy   Manual Therapy Joint mobilization   Joint Mobilization AP jt mobilization femur on tibia   Soft tissue mobilization patella mobility   Muscle Energy Technique Extension contract relax                 PT Education - 09/01/16 0925    Education provided No          PT Short Term Goals - 08/17/16 1425      PT SHORT TERM GOAL #1   Status Achieved           PT Long Term Goals - 09/01/16 0927      PT LONG TERM GOAL #1   Status On-going     PT LONG TERM GOAL #2   Status On-going     PT LONG TERM GOAL #3   Status Unable to assess     PT LONG TERM GOAL #4   Status On-going               Plan - 09/01/16 0925    Clinical Impression Statement Pt toelrated treatment well and  was able to complete all exercises. Pt continues to report medial knee pain with knee extension machine. SAQ was done instead to focus on VMO activation. Progress per pt tolerance.    Rehab Potential Good   PT Frequency 3x / week   PT Duration 8 weeks   PT Treatment/Interventions Vasopneumatic Device;Energy conservation;Manual techniques;Compression bandaging;Passive range of motion;Patient/family education;Neuromuscular re-education;Balance training;Therapeutic exercise;Therapeutic activities;Moist Heat;Ultrasound;Gait training;Stair training;Functional mobility training;Electrical Stimulation;Cryotherapy;ADLs/Self Care Home Management   PT Next Visit Plan Standing LE strenghtening, manual therapy, Pain management, gait speed   Consulted and Agree with Plan of Care Patient      Patient will benefit from skilled therapeutic intervention in order to improve the following deficits and impairments:  Abnormal gait, Decreased balance, Decreased activity tolerance, Decreased mobility, Decreased endurance, Decreased coordination, Decreased range of motion, Decreased strength, Hypomobility, Difficulty walking, Increased edema, Impaired flexibility, Impaired tone, Improper body mechanics, Pain  Visit Diagnosis: Difficulty in walking, not elsewhere classified  Acute pain of left knee  Stiffness of left knee, not elsewhere classified     Problem List Patient Active Problem List   Diagnosis Date Noted  . S/P total knee replacement using cement, left 08/01/2016  . RBBB (right bundle branch block with left anterior fascicular block) 04/12/2016  . PVC's (premature ventricular contractions) 04/12/2016  . Chest wall pain 04/12/2016  . Bronchial asthma 06/29/2014  . Urinary retention due to benign prostatic hyperplasia 04/03/2014  . Ileus, postoperative ([place]) 04/03/2014  . Osteoarthritis of right knee 04/01/2014  . S/P total knee replacement using cement 03/31/2014  . Skin rash 04/11/2013  .  Pseudogout of knee 12/12/2012  . Dyspepsia 08/14/2012  . Trochanteric Bursitis of right hip 07/18/2011  . Benign hypertensive heart disease without heart failure 12/13/2010  . BPH (benign prostatic hyperplasia) 12/13/2010  . Unilateral primary osteoarthritis, left knee 12/13/2010  . Dyslipidemia 12/13/2010  . Status post cholecystectomy 12/13/2010    Toy Baker, SPT 09/01/2016, 9:28 AM  [place], [place], [address] Phone: [phone]   Fax:  [phone]  Name: [name] MRN: [id number] Date of Birth: 1935-02-16

## 2016-09-06 ENCOUNTER — Encounter: Payer: Self-pay | Admitting: Physical Therapy

## 2016-09-06 ENCOUNTER — Ambulatory Visit: Payer: Medicare Other | Attending: Orthopaedic Surgery | Admitting: Physical Therapy

## 2016-09-06 DIAGNOSIS — R600 Localized edema: Secondary | ICD-10-CM | POA: Diagnosis not present

## 2016-09-06 DIAGNOSIS — M25662 Stiffness of left knee, not elsewhere classified: Secondary | ICD-10-CM | POA: Insufficient documentation

## 2016-09-06 DIAGNOSIS — R262 Difficulty in walking, not elsewhere classified: Secondary | ICD-10-CM | POA: Diagnosis not present

## 2016-09-06 DIAGNOSIS — M25562 Pain in left knee: Secondary | ICD-10-CM

## 2016-09-06 NOTE — Therapy (Signed)
[place], [place], [address] Phone: [phone]   Fax:  [phone]  Physical Therapy Treatment  Patient Details  Name: [name] MRN: [id number] Date of Birth: Jan 19, 1935 Referring Provider: Dr. Durward Fortes  Encounter Date: 09/06/2016      PT End of Session - 09/06/16 1016    Visit Number 7   Date for PT Re-Evaluation 10/11/16   PT Start Time 0928   PT Stop Time 1020   PT Time Calculation (min) 52 min   Activity Tolerance Patient tolerated treatment well   Behavior During Therapy [facility] for tasks assessed/performed      Past Medical History:  Diagnosis Date  . Arthritis   . Asthma    only if develops cold  . Dysrhythmia    h/o pvc's  . GERD (gastroesophageal reflux disease)   . Kidney stone on left side   . PVC (premature ventricular contraction)   . Recurrent upper respiratory infection (URI) 03/2011    Past Surgical History:  Procedure Laterality Date  . APPENDECTOMY    . BACK SURGERY     x 4  . CARDIAC CATHETERIZATION     july 2010-minor irregl  . CERVICAL SPINE SURGERY     x 2  . CHOLECYSTECTOMY     2008  . EYE SURGERY     bilat cataract  . INCISION AND DRAINAGE HIP  07/18/2011   Procedure: IRRIGATION AND DEBRIDEMENT HIP;  Surgeon: Garald Balding, MD;  Location: [place];  Service: Orthopedics;  Laterality: Right;  RIGHT HIP EXPLORATION, EXCISION OF DEEP BURSAL SAC  . KIDNEY STONE SURGERY  2011   R  . KNEE ARTHROSCOPY     Left  . SHOULDER ARTHROSCOPY     Left  . TOTAL HIP ARTHROPLASTY     Right  . TOTAL HIP REVISION     Right  . TOTAL KNEE ARTHROPLASTY Right 03/31/2014   Procedure: TOTAL KNEE ARTHROPLASTY;  Surgeon: Garald Balding, MD;  Location: [place];  Service: Orthopedics;  Laterality: Right;  . TOTAL KNEE ARTHROPLASTY Left 08/01/2016   Procedure: TOTAL KNEE ARTHROPLASTY;  Surgeon: Garald Balding, MD;  Location: [place];  Service: Orthopedics;  Laterality:  Left;  . WRIST SURGERY     bilateral, plates    There were no vitals filed for this visit.      Subjective Assessment - 09/06/16 0934    Subjective Pt states he is doing well. He does not present to PT today with his cane or any form of AD.   Currently in Pain? Yes   Pain Score 3    Pain Location Knee   Pain Orientation Left            [facility] PT Assessment - 09/06/16 0001      AROM   Left Knee Extension 5  supine   Left Knee Flexion 120  seated                     [facility] Adult PT Treatment/Exercise - 09/06/16 0001      Knee/Hip Exercises: Aerobic   Stationary Bike 5 min full revs   Nustep 6 min, lvl 6     Knee/Hip Exercises: Standing   Forward Step Up 2 sets;20 reps;Left;Hand Hold: 1;Step Height: 6"   Other Standing Knee Exercises terminal knee extension with blue tband   Other Standing Knee Exercises        Knee/Hip Exercises:  Supine   Short Arc Quad Sets Strengthening;Left;2 sets;20 reps  5 lb ankle weight   Heel Slides Strengthening;Left;2 sets;20 reps  on red ball with 3 lb ankle weight   Straight Leg Raises Strengthening;Left;2 sets;15 reps  5 lb ankle weight   Other Supine Knee/Hip Exercises bridges 2x20     Modalities   Modalities Vasopneumatic;Electrical Stimulation     Electrical Stimulation   Electrical Stimulation Location lef medial knee   Electrical Stimulation Action IFC   Electrical Stimulation Parameters supine   Electrical Stimulation Goals Pain     Vasopneumatic   Number Minutes Vasopneumatic  10 minutes   Vasopnuematic Location  Knee   Vasopneumatic Pressure Medium   Vasopneumatic Temperature  32     Manual Therapy   Manual Therapy Joint mobilization;Passive ROM   Joint Mobilization AP jt mobilization femur on tibia   Soft tissue mobilization patella mobility   Passive ROM HS, SKC, gastroc                PT Education - 09/06/16 1016    Education provided No          PT Short Term Goals - 08/17/16  1425      PT SHORT TERM GOAL #1   Status Achieved           PT Long Term Goals - 09/06/16 1020      PT LONG TERM GOAL #1   Status Partially Met               Plan - 09/06/16 1016    Clinical Impression Statement Pt tolerated treatment well however he continues to report medial left knee pain. Patella mobilization lateral to medial produces crepitis and slight discomfort. Pt AROM has improved to 5-120 which is a great improvement. Progress per pt tolerance.     Rehab Potential Good   PT Frequency 3x / week   PT Duration 8 weeks   PT Treatment/Interventions Vasopneumatic Device;Energy conservation;Manual techniques;Compression bandaging;Passive range of motion;Patient/family education;Neuromuscular re-education;Balance training;Therapeutic exercise;Therapeutic activities;Moist Heat;Ultrasound;Gait training;Stair training;Functional mobility training;Electrical Stimulation;Cryotherapy;ADLs/Self Care Home Management   PT Next Visit Plan Standing LE strenghtening, manual therapy, Pain management, gait speed   Consulted and Agree with Plan of Care Patient      Patient will benefit from skilled therapeutic intervention in order to improve the following deficits and impairments:  Abnormal gait, Decreased balance, Decreased activity tolerance, Decreased mobility, Decreased endurance, Decreased coordination, Decreased range of motion, Decreased strength, Hypomobility, Difficulty walking, Increased edema, Impaired flexibility, Impaired tone, Improper body mechanics, Pain  Visit Diagnosis: Difficulty in walking, not elsewhere classified  Acute pain of left knee  Stiffness of left knee, not elsewhere classified     Problem List Patient Active Problem List   Diagnosis Date Noted  . S/P total knee replacement using cement, left 08/01/2016  . RBBB (right bundle branch block with left anterior fascicular block) 04/12/2016  . PVC's (premature ventricular contractions) 04/12/2016  .  Chest wall pain 04/12/2016  . Bronchial asthma 06/29/2014  . Urinary retention due to benign prostatic hyperplasia 04/03/2014  . Ileus, postoperative ([place]) 04/03/2014  . Osteoarthritis of right knee 04/01/2014  . S/P total knee replacement using cement 03/31/2014  . Skin rash 04/11/2013  . Pseudogout of knee 12/12/2012  . Dyspepsia 08/14/2012  . Trochanteric Bursitis of right hip 07/18/2011  . Benign hypertensive heart disease without heart failure 12/13/2010  . BPH (benign prostatic hyperplasia) 12/13/2010  . Unilateral primary osteoarthritis, left knee 12/13/2010  . Dyslipidemia  12/13/2010  . Status post cholecystectomy 12/13/2010    Toy Baker, SPT 09/06/2016, 10:20 AM  [place], [place], [address] Phone: [phone]   Fax:  [phone]  Name: [name] MRN: [id number] Date of Birth: [birth year]

## 2016-09-08 ENCOUNTER — Ambulatory Visit: Payer: Medicare Other | Admitting: Physical Therapy

## 2016-09-08 ENCOUNTER — Encounter: Payer: Self-pay | Admitting: Physical Therapy

## 2016-09-08 DIAGNOSIS — M25562 Pain in left knee: Secondary | ICD-10-CM

## 2016-09-08 DIAGNOSIS — M25662 Stiffness of left knee, not elsewhere classified: Secondary | ICD-10-CM | POA: Diagnosis not present

## 2016-09-08 DIAGNOSIS — R262 Difficulty in walking, not elsewhere classified: Secondary | ICD-10-CM

## 2016-09-08 DIAGNOSIS — R600 Localized edema: Secondary | ICD-10-CM | POA: Diagnosis not present

## 2016-09-08 NOTE — Therapy (Signed)
[place], [place], [address] Phone: [phone]   Fax:  [phone]  Physical Therapy Treatment  Patient Details  Name: [name] MRN: [id number] Date of Birth: May 11, 1935 Referring Provider: Dr. Durward Fortes  Encounter Date: 09/08/2016      PT End of Session - 09/08/16 1019    Visit Number 8   Date for PT Re-Evaluation 10/11/16   PT Start Time 0930   PT Stop Time 1028   PT Time Calculation (min) 58 min   Activity Tolerance Patient tolerated treatment well   Behavior During Therapy [facility] for tasks assessed/performed      Past Medical History:  Diagnosis Date  . Arthritis   . Asthma    only if develops cold  . Dysrhythmia    h/o pvc's  . GERD (gastroesophageal reflux disease)   . Kidney stone on left side   . PVC (premature ventricular contraction)   . Recurrent upper respiratory infection (URI) 03/2011    Past Surgical History:  Procedure Laterality Date  . APPENDECTOMY    . BACK SURGERY     x 4  . CARDIAC CATHETERIZATION     july 2010-minor irregl  . CERVICAL SPINE SURGERY     x 2  . CHOLECYSTECTOMY     2008  . EYE SURGERY     bilat cataract  . INCISION AND DRAINAGE HIP  07/18/2011   Procedure: IRRIGATION AND DEBRIDEMENT HIP;  Surgeon: Garald Balding, MD;  Location: [place];  Service: Orthopedics;  Laterality: Right;  RIGHT HIP EXPLORATION, EXCISION OF DEEP BURSAL SAC  . KIDNEY STONE SURGERY  2011   R  . KNEE ARTHROSCOPY     Left  . SHOULDER ARTHROSCOPY     Left  . TOTAL HIP ARTHROPLASTY     Right  . TOTAL HIP REVISION     Right  . TOTAL KNEE ARTHROPLASTY Right 03/31/2014   Procedure: TOTAL KNEE ARTHROPLASTY;  Surgeon: Garald Balding, MD;  Location: [place];  Service: Orthopedics;  Laterality: Right;  . TOTAL KNEE ARTHROPLASTY Left 08/01/2016   Procedure: TOTAL KNEE ARTHROPLASTY;  Surgeon: Garald Balding, MD;  Location: [place];  Service: Orthopedics;  Laterality:  Left;  . WRIST SURGERY     bilateral, plates    There were no vitals filed for this visit.      Subjective Assessment - 09/08/16 0930    Subjective "Its a little tight"   Currently in Pain? Yes   Pain Score 3    Pain Location Knee   Pain Orientation Left                         [facility] Adult PT Treatment/Exercise - 09/08/16 0001      Knee/Hip Exercises: Aerobic   Stationary Bike 5 min full revs   Elliptical I 10 R 3 x2 min     Knee/Hip Exercises: Machines for Strengthening   Cybex Knee Extension 10#, 2x10, 5# x10   Cybex Knee Flexion 10lb 2x15; LLE 5lb 2x10   Cybex Leg Press 40 lb x15, 60 lb x10; LLE TKE 40lb 2x15      Knee/Hip Exercises: Standing   Forward Step Up Left;Hand Hold: 9;3 set;Step Height: 8";10 reps  explosive    Walking with Sports Cord 50lb 4 way x5      Vasopneumatic   Number Minutes Vasopneumatic  15 minutes   Vasopnuematic Location  Knee  Vasopneumatic Pressure Low   Vasopneumatic Temperature  32                  PT Short Term Goals - 08/17/16 1425      PT SHORT TERM GOAL #1   Status Achieved           PT Long Term Goals - 09/06/16 1020      PT LONG TERM GOAL #1   Status Partially Met               Plan - 09/08/16 1020    Clinical Impression Statement Attempted elliptical for the first time, and reported some medial knee pain. tolerated all other exercises well without increase pain.  It appears pt able to reach full L knee extensions with closed chain interventions.   Rehab Potential Good   PT Frequency 3x / week   PT Treatment/Interventions Vasopneumatic Device;Energy conservation;Manual techniques;Compression bandaging;Passive range of motion;Patient/family education;Neuromuscular re-education;Balance training;Therapeutic exercise;Therapeutic activities;Moist Heat;Ultrasound;Gait training;Stair training;Functional mobility training;Electrical Stimulation;Cryotherapy;ADLs/Self Care Home Management   PT  Next Visit Plan Standing LE strenghtening, manual therapy, Pain management, gait speed      Patient will benefit from skilled therapeutic intervention in order to improve the following deficits and impairments:  Abnormal gait, Decreased balance, Decreased activity tolerance, Decreased mobility, Decreased endurance, Decreased coordination, Decreased range of motion, Decreased strength, Hypomobility, Difficulty walking, Increased edema, Impaired flexibility, Impaired tone, Improper body mechanics, Pain  Visit Diagnosis: Difficulty in walking, not elsewhere classified  Acute pain of left knee  Stiffness of left knee, not elsewhere classified  Localized edema     Problem List Patient Active Problem List   Diagnosis Date Noted  . S/P total knee replacement using cement, left 08/01/2016  . RBBB (right bundle branch block with left anterior fascicular block) 04/12/2016  . PVC's (premature ventricular contractions) 04/12/2016  . Chest wall pain 04/12/2016  . Bronchial asthma 06/29/2014  . Urinary retention due to benign prostatic hyperplasia 04/03/2014  . Ileus, postoperative ([place]) 04/03/2014  . Osteoarthritis of right knee 04/01/2014  . S/P total knee replacement using cement 03/31/2014  . Skin rash 04/11/2013  . Pseudogout of knee 12/12/2012  . Dyspepsia 08/14/2012  . Trochanteric Bursitis of right hip 07/18/2011  . Benign hypertensive heart disease without heart failure 12/13/2010  . BPH (benign prostatic hyperplasia) 12/13/2010  . Unilateral primary osteoarthritis, left knee 12/13/2010  . Dyslipidemia 12/13/2010  . Status post cholecystectomy 12/13/2010    Scot Jun 09/08/2016, 10:23 AM  [place], [place], [address] Phone: [phone]   Fax:  [phone]  Name: [name] MRN: [id number] Date of Birth: 1935/01/12

## 2016-09-11 ENCOUNTER — Encounter: Payer: Self-pay | Admitting: Physical Therapy

## 2016-09-11 ENCOUNTER — Ambulatory Visit: Payer: Medicare Other | Admitting: Physical Therapy

## 2016-09-11 DIAGNOSIS — M25662 Stiffness of left knee, not elsewhere classified: Secondary | ICD-10-CM

## 2016-09-11 DIAGNOSIS — M25562 Pain in left knee: Secondary | ICD-10-CM

## 2016-09-11 DIAGNOSIS — R600 Localized edema: Secondary | ICD-10-CM | POA: Diagnosis not present

## 2016-09-11 DIAGNOSIS — R262 Difficulty in walking, not elsewhere classified: Secondary | ICD-10-CM | POA: Diagnosis not present

## 2016-09-11 NOTE — Therapy (Signed)
[place], [place], [address] Phone: [phone]   Fax:  [phone]  Physical Therapy Treatment  Patient Details  Name: [name] MRN: [id number] Date of Birth: 14-Jun-1935 Referring Provider: Dr. Durward Fortes  Encounter Date: 09/11/2016      PT End of Session - 09/11/16 1059    Visit Number 9   Date for PT Re-Evaluation 10/11/16   PT Start Time 9509   PT Stop Time 1105   PT Time Calculation (min) 50 min   Activity Tolerance Patient tolerated treatment well   Behavior During Therapy [facility] for tasks assessed/performed      Past Medical History:  Diagnosis Date  . Arthritis   . Asthma    only if develops cold  . Dysrhythmia    h/o pvc's  . GERD (gastroesophageal reflux disease)   . Kidney stone on left side   . PVC (premature ventricular contraction)   . Recurrent upper respiratory infection (URI) 03/2011    Past Surgical History:  Procedure Laterality Date  . APPENDECTOMY    . BACK SURGERY     x 4  . CARDIAC CATHETERIZATION     july 2010-minor irregl  . CERVICAL SPINE SURGERY     x 2  . CHOLECYSTECTOMY     2008  . EYE SURGERY     bilat cataract  . INCISION AND DRAINAGE HIP  07/18/2011   Procedure: IRRIGATION AND DEBRIDEMENT HIP;  Surgeon: Garald Balding, MD;  Location: [place];  Service: Orthopedics;  Laterality: Right;  RIGHT HIP EXPLORATION, EXCISION OF DEEP BURSAL SAC  . KIDNEY STONE SURGERY  2011   R  . KNEE ARTHROSCOPY     Left  . SHOULDER ARTHROSCOPY     Left  . TOTAL HIP ARTHROPLASTY     Right  . TOTAL HIP REVISION     Right  . TOTAL KNEE ARTHROPLASTY Right 03/31/2014   Procedure: TOTAL KNEE ARTHROPLASTY;  Surgeon: Garald Balding, MD;  Location: [place];  Service: Orthopedics;  Laterality: Right;  . TOTAL KNEE ARTHROPLASTY Left 08/01/2016   Procedure: TOTAL KNEE ARTHROPLASTY;  Surgeon: Garald Balding, MD;  Location: [place];  Service: Orthopedics;  Laterality:  Left;  . WRIST SURGERY     bilateral, plates    There were no vitals filed for this visit.      Subjective Assessment - 09/11/16 1020    Subjective Pt states he is "a little stiff this morning".    Currently in Pain? Yes   Pain Score 3    Pain Location Knee   Pain Orientation Left;Medial            [facility] PT Assessment - 09/11/16 0001      AROM   Left Knee Extension 5  seated   Left Knee Flexion 120  seated     Strength   Left Knee Flexion 5/5   Left Knee Extension 4/5  limited by medial anterior knee pain                     [facility] Adult PT Treatment/Exercise - 09/11/16 0001      Ambulation/Gait   Stairs Yes   Stairs Assistance 7: Independent   Stair Management Technique One rail Right;Alternating pattern   Number of Stairs 14  3 flights ascend and descend     Knee/Hip Exercises: Aerobic   Recumbent Bike 5 minutes   Nustep 6 min, lvl  6     Knee/Hip Exercises: Machines for Strengthening   Cybex Knee Extension 5 lb LLE 2X15   Cybex Knee Flexion 45 lb x20, 15 lb LLE X15, 20 lb X15 LLE    Cybex Leg Press 40 lb x20   Other Machine resisted walking fwd, backward, side steps with 40 lb resistance cables     Modalities   Modalities Vasopneumatic     Vasopneumatic   Number Minutes Vasopneumatic  10 minutes   Vasopnuematic Location  Knee   Vasopneumatic Pressure High   Vasopneumatic Temperature  32                PT Education - 09/11/16 1059    Education provided No          PT Short Term Goals - 08/17/16 1425      PT SHORT TERM GOAL #1   Status Achieved           PT Long Term Goals - 09/11/16 1113      PT LONG TERM GOAL #1   Status Partially Met     PT LONG TERM GOAL #4   Status Partially Met               Plan - 09/11/16 1100    Clinical Impression Statement Pt tolerated tretament well and has progressed nicely. Pt was able to ascend and descend stairs today with reciprocal pattern and reported no pain. Pt  has improved AROM to 5-120. Pt should be ready for discharge pending next doctor visit.   Rehab Potential Good   PT Frequency 3x / week   PT Duration 8 weeks   PT Treatment/Interventions Vasopneumatic Device;Energy conservation;Manual techniques;Compression bandaging;Passive range of motion;Patient/family education;Neuromuscular re-education;Balance training;Therapeutic exercise;Therapeutic activities;Moist Heat;Ultrasound;Gait training;Stair training;Functional mobility training;Electrical Stimulation;Cryotherapy;ADLs/Self Care Home Management   PT Next Visit Plan Standing LE strenghtening, manual therapy, gait speed, higher level balance and gait    Consulted and Agree with Plan of Care Patient      Patient will benefit from skilled therapeutic intervention in order to improve the following deficits and impairments:  Abnormal gait, Decreased balance, Decreased activity tolerance, Decreased mobility, Decreased endurance, Decreased coordination, Decreased range of motion, Decreased strength, Hypomobility, Difficulty walking, Increased edema, Impaired flexibility, Impaired tone, Improper body mechanics, Pain  Visit Diagnosis: Stiffness of left knee, not elsewhere classified  Acute pain of left knee     Problem List Patient Active Problem List   Diagnosis Date Noted  . S/P total knee replacement using cement, left 08/01/2016  . RBBB (right bundle branch block with left anterior fascicular block) 04/12/2016  . PVC's (premature ventricular contractions) 04/12/2016  . Chest wall pain 04/12/2016  . Bronchial asthma 06/29/2014  . Urinary retention due to benign prostatic hyperplasia 04/03/2014  . Ileus, postoperative ([place]) 04/03/2014  . Osteoarthritis of right knee 04/01/2014  . S/P total knee replacement using cement 03/31/2014  . Skin rash 04/11/2013  . Pseudogout of knee 12/12/2012  . Dyspepsia 08/14/2012  . Trochanteric Bursitis of right hip 07/18/2011  . Benign hypertensive heart  disease without heart failure 12/13/2010  . BPH (benign prostatic hyperplasia) 12/13/2010  . Unilateral primary osteoarthritis, left knee 12/13/2010  . Dyslipidemia 12/13/2010  . Status post cholecystectomy 12/13/2010    Toy Baker, SPT 09/11/2016, 11:15 AM  [place], [place], [address] Phone: [phone]   Fax:  [phone]  Name: [name] MRN: [id number] Date of Birth:  10/21/1934    

## 2016-09-13 ENCOUNTER — Ambulatory Visit: Payer: Medicare Other | Admitting: Physical Therapy

## 2016-09-13 ENCOUNTER — Encounter: Payer: Self-pay | Admitting: Physical Therapy

## 2016-09-13 DIAGNOSIS — M25562 Pain in left knee: Secondary | ICD-10-CM | POA: Diagnosis not present

## 2016-09-13 DIAGNOSIS — R600 Localized edema: Secondary | ICD-10-CM | POA: Diagnosis not present

## 2016-09-13 DIAGNOSIS — M25662 Stiffness of left knee, not elsewhere classified: Secondary | ICD-10-CM | POA: Diagnosis not present

## 2016-09-13 DIAGNOSIS — R262 Difficulty in walking, not elsewhere classified: Secondary | ICD-10-CM

## 2016-09-13 NOTE — Therapy (Signed)
[place], [place], [address] Phone: [phone]   Fax:  [phone]  Physical Therapy Treatment  Patient Details  Name: [name] MRN: [id number] Date of Birth: 1934-12-16 Referring Provider: Dr. Durward Fortes  Encounter Date: 09/13/2016      PT End of Session - 09/13/16 1103    Visit Number 10   Date for PT Re-Evaluation 10/11/16   PT Start Time 3235   PT Stop Time 1110   PT Time Calculation (min) 55 min      Past Medical History:  Diagnosis Date  . Arthritis   . Asthma    only if develops cold  . Dysrhythmia    h/o pvc's  . GERD (gastroesophageal reflux disease)   . Kidney stone on left side   . PVC (premature ventricular contraction)   . Recurrent upper respiratory infection (URI) 03/2011    Past Surgical History:  Procedure Laterality Date  . APPENDECTOMY    . BACK SURGERY     x 4  . CARDIAC CATHETERIZATION     july 2010-minor irregl  . CERVICAL SPINE SURGERY     x 2  . CHOLECYSTECTOMY     2008  . EYE SURGERY     bilat cataract  . INCISION AND DRAINAGE HIP  07/18/2011   Procedure: IRRIGATION AND DEBRIDEMENT HIP;  Surgeon: Garald Balding, MD;  Location: [place];  Service: Orthopedics;  Laterality: Right;  RIGHT HIP EXPLORATION, EXCISION OF DEEP BURSAL SAC  . KIDNEY STONE SURGERY  2011   R  . KNEE ARTHROSCOPY     Left  . SHOULDER ARTHROSCOPY     Left  . TOTAL HIP ARTHROPLASTY     Right  . TOTAL HIP REVISION     Right  . TOTAL KNEE ARTHROPLASTY Right 03/31/2014   Procedure: TOTAL KNEE ARTHROPLASTY;  Surgeon: Garald Balding, MD;  Location: [place];  Service: Orthopedics;  Laterality: Right;  . TOTAL KNEE ARTHROPLASTY Left 08/01/2016   Procedure: TOTAL KNEE ARTHROPLASTY;  Surgeon: Garald Balding, MD;  Location: [place];  Service: Orthopedics;  Laterality: Left;  . WRIST SURGERY     bilateral, plates    There were no vitals filed for this visit.       Subjective Assessment - 09/13/16 1016    Subjective "Pretty good"   Currently in Pain? Yes   Pain Score 3    Pain Location Knee                         [facility] Adult PT Treatment/Exercise - 09/13/16 0001      Knee/Hip Exercises: Aerobic   Elliptical I 10 R 3 x3 min   Recumbent Bike 4 minutes     Knee/Hip Exercises: Machines for Strengthening   Cybex Leg Press 40 lb 2x15, calf raises x 20     Knee/Hip Exercises: Standing   Walking with Sports Cord 50lb 4 way x10 front/back and x 5 each side   Other Standing Knee Exercises Red Tband side stepping, marching, backwards walking 49f x 2 each     Knee/Hip Exercises: Seated   Sit to Sand 10 reps;without UE support;2 sets  eccentric with 3 sec pause     Vasopneumatic   Number Minutes Vasopneumatic  15 minutes   Vasopnuematic Location  Knee   Vasopneumatic Pressure High   Vasopneumatic Temperature  32  PT Short Term Goals - 08/17/16 1425      PT SHORT TERM GOAL #1   Status Achieved           PT Long Term Goals - 09/11/16 1113      PT LONG TERM GOAL #1   Status Partially Met     PT LONG TERM GOAL #4   Status Partially Met               Plan - 09/13/16 1106    Clinical Impression Statement Pt tolerated therapy well with all progressions. Fatigued with elliptical at 3 mins so he was switched to the recumbent bike. He expressed 3/10 left lateral knee pain with eccentric sit to stands Also right knee slightly adducts with concentric sit to stand so a red tband was added. Needed Max VC with looking up during walking activities.   Rehab Potential Good   PT Frequency 3x / week   PT Duration 8 weeks   PT Treatment/Interventions Vasopneumatic Device;Energy conservation;Manual techniques;Compression bandaging;Passive range of motion;Patient/family education;Neuromuscular re-education;Balance training;Therapeutic exercise;Therapeutic activities;Moist Heat;Ultrasound;Gait  training;Stair training;Functional mobility training;Electrical Stimulation;Cryotherapy;ADLs/Self Care Home Management   PT Next Visit Plan Standing LE strenghtening, manual therapy, gait speed, higher level balance and gait    PT Home Exercise Plan Continue with current      Patient will benefit from skilled therapeutic intervention in order to improve the following deficits and impairments:  Abnormal gait, Decreased balance, Decreased activity tolerance, Decreased mobility, Decreased endurance, Decreased coordination, Decreased range of motion, Decreased strength, Hypomobility, Difficulty walking, Increased edema, Impaired flexibility, Impaired tone, Improper body mechanics, Pain  Visit Diagnosis: Stiffness of left knee, not elsewhere classified  Acute pain of left knee  Difficulty in walking, not elsewhere classified  Localized edema     Problem List Patient Active Problem List   Diagnosis Date Noted  . S/P total knee replacement using cement, left 08/01/2016  . RBBB (right bundle branch block with left anterior fascicular block) 04/12/2016  . PVC's (premature ventricular contractions) 04/12/2016  . Chest wall pain 04/12/2016  . Bronchial asthma 06/29/2014  . Urinary retention due to benign prostatic hyperplasia 04/03/2014  . Ileus, postoperative ([place]) 04/03/2014  . Osteoarthritis of right knee 04/01/2014  . S/P total knee replacement using cement 03/31/2014  . Skin rash 04/11/2013  . Pseudogout of knee 12/12/2012  . Dyspepsia 08/14/2012  . Trochanteric Bursitis of right hip 07/18/2011  . Benign hypertensive heart disease without heart failure 12/13/2010  . BPH (benign prostatic hyperplasia) 12/13/2010  . Unilateral primary osteoarthritis, left knee 12/13/2010  . Dyslipidemia 12/13/2010  . Status post cholecystectomy 12/13/2010    Scot Jun, PTA  09/13/2016, 11:16 AM  [place]  [place], [place], [address] Phone: [phone]   Fax:  [phone]  Name: [name] MRN: [id number] Date of Birth: [birth year]

## 2016-09-15 ENCOUNTER — Ambulatory Visit: Payer: Medicare Other | Admitting: Physical Therapy

## 2016-09-15 ENCOUNTER — Encounter: Payer: Self-pay | Admitting: Physical Therapy

## 2016-09-15 DIAGNOSIS — M25662 Stiffness of left knee, not elsewhere classified: Secondary | ICD-10-CM

## 2016-09-15 DIAGNOSIS — M25562 Pain in left knee: Secondary | ICD-10-CM

## 2016-09-15 DIAGNOSIS — R262 Difficulty in walking, not elsewhere classified: Secondary | ICD-10-CM | POA: Diagnosis not present

## 2016-09-15 DIAGNOSIS — R600 Localized edema: Secondary | ICD-10-CM | POA: Diagnosis not present

## 2016-09-15 NOTE — Therapy (Signed)
[place], [place], [address] Phone: [phone]   Fax:  [phone]  Physical Therapy Treatment  Patient Details  Name: [name] MRN: [id number] Date of Birth: [birth year] Referring Provider: Dr. Durward Fortes  Encounter Date: 09/15/2016      PT End of Session - 09/15/16 0928    Visit Number 11   Date for PT Re-Evaluation 10/11/16   PT Start Time 0840   PT Stop Time 0930   PT Time Calculation (min) 50 min   Activity Tolerance Patient tolerated treatment well   Behavior During Therapy [facility] for tasks assessed/performed      Past Medical History:  Diagnosis Date  . Arthritis   . Asthma    only if develops cold  . Dysrhythmia    h/o pvc's  . GERD (gastroesophageal reflux disease)   . Kidney stone on left side   . PVC (premature ventricular contraction)   . Recurrent upper respiratory infection (URI) 03/2011    Past Surgical History:  Procedure Laterality Date  . APPENDECTOMY    . BACK SURGERY     x 4  . CARDIAC CATHETERIZATION     july 2010-minor irregl  . CERVICAL SPINE SURGERY     x 2  . CHOLECYSTECTOMY     2008  . EYE SURGERY     bilat cataract  . INCISION AND DRAINAGE HIP  07/18/2011   Procedure: IRRIGATION AND DEBRIDEMENT HIP;  Surgeon: Garald Balding, MD;  Location: [place];  Service: Orthopedics;  Laterality: Right;  RIGHT HIP EXPLORATION, EXCISION OF DEEP BURSAL SAC  . KIDNEY STONE SURGERY  2011   R  . KNEE ARTHROSCOPY     Left  . SHOULDER ARTHROSCOPY     Left  . TOTAL HIP ARTHROPLASTY     Right  . TOTAL HIP REVISION     Right  . TOTAL KNEE ARTHROPLASTY Right 03/31/2014   Procedure: TOTAL KNEE ARTHROPLASTY;  Surgeon: Garald Balding, MD;  Location: [place];  Service: Orthopedics;  Laterality: Right;  . TOTAL KNEE ARTHROPLASTY Left 08/01/2016   Procedure: TOTAL KNEE ARTHROPLASTY;  Surgeon: Garald Balding, MD;  Location: [place];  Service: Orthopedics;   Laterality: Left;  . WRIST SURGERY     bilateral, plates    There were no vitals filed for this visit.      Subjective Assessment - 09/15/16 0838    Subjective Pt states he is doing okay, "just a little tight". Pt reports that the knee was swollen bad after the last session.   Currently in Pain? Yes   Pain Score 4             [facility] PT Assessment - 09/15/16 0001      Strength   Left Knee Flexion 5/5   Left Knee Extension 5/5                     [facility] Adult PT Treatment/Exercise - 09/15/16 0001      Ambulation/Gait   Stairs Yes   Stairs Assistance 7: Independent   Stair Management Technique One rail Right   Number of Stairs 28   Height of Stairs 6     Knee/Hip Exercises: Aerobic   Stationary Bike 6 min full revs     Knee/Hip Exercises: Machines for Strengthening   Cybex Knee Extension 10 lb both legs up, left leg down 2x10   Cybex Knee Flexion 45 lb, 2x10  Knee/Hip Exercises: Standing   Functional Squat 10 reps;Other (comment)  15 lb box working on Publishing copy 3 sets;Other (comment)  wall sits hold 30 secs     Modalities   Modalities Vasopneumatic     Vasopneumatic   Number Minutes Vasopneumatic  15 minutes   Vasopnuematic Location  Knee   Vasopneumatic Pressure Medium   Vasopneumatic Temperature  32     Manual Therapy   Manual Therapy Joint mobilization;Passive ROM   Joint Mobilization patella mobilization, AP femur on tibia    Passive ROM HS stretch                PT Education - 09/15/16 0928    Education provided No          PT Short Term Goals - 08/17/16 1425      PT SHORT TERM GOAL #1   Status Achieved           PT Long Term Goals - 09/15/16 0851      PT LONG TERM GOAL #4   Status Achieved               Plan - 09/15/16 4388    Clinical Impression Statement Pt tolerated treatment well and was able to complete all exercises. Pt continues to report medial knee pain with descending  stairs and was told to let his doctor know about the consistent medial knee pain. Pt has met or partially met all of his goals. He is instructed to cancel his Monday appointment and only come Wednesday. Plan to discharge next week.   Rehab Potential Good   PT Frequency 3x / week   PT Duration 8 weeks   PT Treatment/Interventions Vasopneumatic Device;Energy conservation;Manual techniques;Compression bandaging;Passive range of motion;Patient/family education;Neuromuscular re-education;Balance training;Therapeutic exercise;Therapeutic activities;Moist Heat;Ultrasound;Gait training;Stair training;Functional mobility training;Electrical Stimulation;Cryotherapy;ADLs/Self Care Home Management   PT Next Visit Plan Standing LE strenghtening, manual therapy, gait speed, higher level balance and gait    PT Home Exercise Plan Continue with current   Consulted and Agree with Plan of Care Patient      Patient will benefit from skilled therapeutic intervention in order to improve the following deficits and impairments:  Abnormal gait, Decreased balance, Decreased activity tolerance, Decreased mobility, Decreased endurance, Decreased coordination, Decreased range of motion, Decreased strength, Hypomobility, Difficulty walking, Increased edema, Impaired flexibility, Impaired tone, Improper body mechanics, Pain  Visit Diagnosis: Stiffness of left knee, not elsewhere classified  Acute pain of left knee     Problem List Patient Active Problem List   Diagnosis Date Noted  . S/P total knee replacement using cement, left 08/01/2016  . RBBB (right bundle branch block with left anterior fascicular block) 04/12/2016  . PVC's (premature ventricular contractions) 04/12/2016  . Chest wall pain 04/12/2016  . Bronchial asthma 06/29/2014  . Urinary retention due to benign prostatic hyperplasia 04/03/2014  . Ileus, postoperative ([place]) 04/03/2014  . Osteoarthritis of right knee 04/01/2014  . S/P total knee replacement  using cement 03/31/2014  . Skin rash 04/11/2013  . Pseudogout of knee 12/12/2012  . Dyspepsia 08/14/2012  . Trochanteric Bursitis of right hip 07/18/2011  . Benign hypertensive heart disease without heart failure 12/13/2010  . BPH (benign prostatic hyperplasia) 12/13/2010  . Unilateral primary osteoarthritis, left knee 12/13/2010  . Dyslipidemia 12/13/2010  . Status post cholecystectomy 12/13/2010    Toy Baker, SPT 09/15/2016, 9:31 AM  [place], [place],  [address] Phone: [phone]   Fax:  [phone]  Name: [name] MRN: [id number] Date of Birth: 1934/11/04

## 2016-09-18 ENCOUNTER — Ambulatory Visit: Payer: Medicare Other | Admitting: Physical Therapy

## 2016-09-20 ENCOUNTER — Ambulatory Visit: Payer: Medicare Other | Admitting: Physical Therapy

## 2016-09-22 ENCOUNTER — Ambulatory Visit (INDEPENDENT_AMBULATORY_CARE_PROVIDER_SITE_OTHER): Payer: Medicare Other | Admitting: Orthopaedic Surgery

## 2016-09-22 ENCOUNTER — Telehealth: Payer: Self-pay | Admitting: Physical Therapy

## 2016-09-22 DIAGNOSIS — Z96653 Presence of artificial knee joint, bilateral: Secondary | ICD-10-CM

## 2016-09-22 NOTE — Progress Notes (Signed)
Office Visit Note   Patient: [name]           Date of Birth: 05-May-1935           MRN: [id number] Visit Date: 09/22/2016              Requested by: George Coffin, MD [place] [address] [place] [employer], [place] [address] PCP: George Coffin, MD   Assessment & Plan: Visit Diagnoses:s/p left TKR   Plan: continue home exercises,f/u 3 months, antibiotics for any invasive procedures  Follow-Up Instructions: No Follow-up on file.   Orders:  No orders of the defined types were placed in this encounter.  No orders of the defined types were placed in this encounter.     Procedures: No procedures performed   Clinical Data: No additional findings.   Subjective: No chief complaint on file.   Left knee pain.  8 weeks post op left TKA Pt wants to know should he continue with pt?  [name] is 2 months status post left total knee replacement. He's doing exceptionally well. He is not having any appreciable pain and walks without ambulatory aid. His fever chills or significant swelling.  Review of Systems   Objective: Vital Signs: There were no vitals taken for this visit.  Physical Exam  Ortho Exam left knee exam with minimal effusion. The left knee was not hot warm or erythematous. No calf pain. No swelling distally. 120 of flexion with a goniometer and full extension. No instability with a varus or valgus stress. About a 2 mm anterior drawer sign. No popping or clicking. A little bit of tenderness in the posterior lateral corner of his knee. Neurovascular exam intact.Marland Kitchen  Specialty Comments:  No specialty comments available.  Imaging: No results found.   PMFS History: Patient Active Problem List   Diagnosis Date Noted  . S/P total knee replacement using cement, left 08/01/2016  . RBBB (right bundle branch block with left anterior fascicular block) 04/12/2016  . PVC's (premature ventricular contractions) 04/12/2016  . Chest wall pain 04/12/2016  . Bronchial  asthma 06/29/2014  . Urinary retention due to benign prostatic hyperplasia 04/03/2014  . Ileus, postoperative ([place]) 04/03/2014  . Osteoarthritis of right knee 04/01/2014  . S/P total knee replacement using cement 03/31/2014  . Skin rash 04/11/2013  . Pseudogout of knee 12/12/2012  . Dyspepsia 08/14/2012  . Trochanteric Bursitis of right hip 07/18/2011  . Benign hypertensive heart disease without heart failure 12/13/2010  . BPH (benign prostatic hyperplasia) 12/13/2010  . Unilateral primary osteoarthritis, left knee 12/13/2010  . Dyslipidemia 12/13/2010  . Status post cholecystectomy 12/13/2010   Past Medical History:  Diagnosis Date  . Arthritis   . Asthma    only if develops cold  . Dysrhythmia    h/o pvc's  . GERD (gastroesophageal reflux disease)   . Kidney stone on left side   . PVC (premature ventricular contraction)   . Recurrent upper respiratory infection (URI) 03/2011    Family History  Problem Relation Age of Onset  . Heart disease Mother   . Heart attack Mother   . Anesthesia problems Neg Hx   . Hypotension Neg Hx   . Malignant hyperthermia Neg Hx   . Pseudochol deficiency Neg Hx     Past Surgical History:  Procedure Laterality Date  . APPENDECTOMY    . BACK SURGERY     x 4  . CARDIAC CATHETERIZATION     july 2010-minor irregl  . CERVICAL SPINE SURGERY  x 2  . CHOLECYSTECTOMY     2008  . EYE SURGERY     bilat cataract  . INCISION AND DRAINAGE HIP  07/18/2011   Procedure: IRRIGATION AND DEBRIDEMENT HIP;  Surgeon: Garald Balding, MD;  Location: [place];  Service: Orthopedics;  Laterality: Right;  RIGHT HIP EXPLORATION, EXCISION OF DEEP BURSAL SAC  . KIDNEY STONE SURGERY  2011   R  . KNEE ARTHROSCOPY     Left  . SHOULDER ARTHROSCOPY     Left  . TOTAL HIP ARTHROPLASTY     Right  . TOTAL HIP REVISION     Right  . TOTAL KNEE ARTHROPLASTY Right 03/31/2014   Procedure: TOTAL KNEE ARTHROPLASTY;  Surgeon: Garald Balding, MD;  Location: [place];   Service: Orthopedics;  Laterality: Right;  . TOTAL KNEE ARTHROPLASTY Left 08/01/2016   Procedure: TOTAL KNEE ARTHROPLASTY;  Surgeon: Garald Balding, MD;  Location: [place];  Service: Orthopedics;  Laterality: Left;  . WRIST SURGERY     bilateral, plates   Social History   Occupational History  . Not on file.   Social History Main Topics  . Smoking status: Never Smoker  . Smokeless tobacco: Never Used  . Alcohol use No  . Drug use: No  . Sexual activity: Not on file

## 2016-11-06 ENCOUNTER — Other Ambulatory Visit (INDEPENDENT_AMBULATORY_CARE_PROVIDER_SITE_OTHER): Payer: Self-pay

## 2016-11-06 ENCOUNTER — Telehealth (INDEPENDENT_AMBULATORY_CARE_PROVIDER_SITE_OTHER): Payer: Self-pay | Admitting: Orthopaedic Surgery

## 2016-11-06 MED ORDER — CEPHALEXIN 500 MG PO CAPS: 500.0000 mg | ORAL_CAPSULE | ORAL | 0 refills | Status: AC

## 2016-11-06 NOTE — Telephone Encounter (Signed)
Patient called stated that he went to the dentist and has two cavities.  They wont fill them until he gets an antibiotic.  He uses [facility] in the [facility] center.  Cb#[phone].  Thank you.

## 2016-11-06 NOTE — Telephone Encounter (Signed)
Sent to pharmacy 

## 2016-11-14 ENCOUNTER — Other Ambulatory Visit: Payer: Self-pay | Admitting: Internal Medicine

## 2016-11-14 NOTE — Telephone Encounter (Signed)
Rx(s) sent to pharmacy electronically.  

## 2016-11-23 ENCOUNTER — Encounter: Payer: Self-pay | Admitting: Internal Medicine

## 2016-11-23 ENCOUNTER — Ambulatory Visit (INDEPENDENT_AMBULATORY_CARE_PROVIDER_SITE_OTHER): Payer: Medicare Other | Admitting: Internal Medicine

## 2016-11-23 VITALS — BP 122/70 | HR 58 | Ht 76.0 in | Wt 236.8 lb

## 2016-11-23 DIAGNOSIS — I493 Ventricular premature depolarization: Secondary | ICD-10-CM | POA: Diagnosis not present

## 2016-11-23 DIAGNOSIS — I452 Bifascicular block: Secondary | ICD-10-CM | POA: Diagnosis not present

## 2016-11-23 DIAGNOSIS — I119 Hypertensive heart disease without heart failure: Secondary | ICD-10-CM | POA: Diagnosis not present

## 2016-11-23 MED ORDER — METOPROLOL SUCCINATE ER 25 MG PO TB24: 25.0000 mg | ORAL_TABLET | Freq: Every day | ORAL | 1 refills | Status: DC

## 2016-11-23 NOTE — Patient Instructions (Signed)
Your physician has recommended you make the following change in your medication:  -- STOP metoprolol tartrate 25mg   -- START metoprolol succinate 25mg  daily  Your physician wants you to follow-up in: [name]. You will receive a reminder letter in the mail two months in advance. If you don't receive a letter, please call our office to schedule the follow-up appointment.

## 2016-11-23 NOTE — Progress Notes (Signed)
OFFICE NOTE  Chief Complaint:  No complaints  Primary Care Physician: Donnie Coffin, MD  HPI:  [name] is a 82 y.o. male who is a former patient of Dr. Mare Ferrari. His wife is also a patient of mine. He has a history of PVCs and is on medicine for suppression of those PVCs and also possible hypertension. Blood pressure initially 155/83 became down to 120/78. He was noted to have a few PVCs today but said he did not take his medicine this morning. Other than that he denies any medical problems. He has a history of multiple orthopedic surgeries and gallbladder surgery in the past. He has a chronic left-sided chest pain which is sharp and he can point to with his finger. This is along the left sternal border and generally does not move, worsened with exertion or is not relieved by rest. He's had 2 heart catheterizations last 5 years, neither study indicated any significant obstructive coronary disease.  11/23/2016  [name] returns today for follow-up. He denies any chest pain or worsening shortness of breath. He does have some PVCs however he is unaware of them. He is on once daily metoprolol tartrate. Blood pressure is at goal today 122/70.  PMHx:  Past Medical History:  Diagnosis Date  . Arthritis   . Asthma    only if develops cold  . Dysrhythmia    h/o pvc's  . GERD (gastroesophageal reflux disease)   . Kidney stone on left side   . PVC (premature ventricular contraction)   . Recurrent upper respiratory infection (URI) 03/2011    Past Surgical History:  Procedure Laterality Date  . APPENDECTOMY    . BACK SURGERY     x 4  . CARDIAC CATHETERIZATION     july 2010-minor irregl  . CERVICAL SPINE SURGERY     x 2  . CHOLECYSTECTOMY     2008  . EYE SURGERY     bilat cataract  . INCISION AND DRAINAGE HIP  07/18/2011   Procedure: IRRIGATION AND DEBRIDEMENT HIP;  Surgeon: Garald Balding, MD;  Location: [place];  Service: Orthopedics;  Laterality: Right;  RIGHT HIP  EXPLORATION, EXCISION OF DEEP BURSAL SAC  . KIDNEY STONE SURGERY  2011   R  . KNEE ARTHROSCOPY     Left  . SHOULDER ARTHROSCOPY     Left  . TOTAL HIP ARTHROPLASTY     Right  . TOTAL HIP REVISION     Right  . TOTAL KNEE ARTHROPLASTY Right 03/31/2014   Procedure: TOTAL KNEE ARTHROPLASTY;  Surgeon: Garald Balding, MD;  Location: [place];  Service: Orthopedics;  Laterality: Right;  . TOTAL KNEE ARTHROPLASTY Left 08/01/2016   Procedure: TOTAL KNEE ARTHROPLASTY;  Surgeon: Garald Balding, MD;  Location: [place];  Service: Orthopedics;  Laterality: Left;  . WRIST SURGERY     bilateral, plates    FAMHx:  Family History  Problem Relation Age of Onset  . Heart disease Mother   . Heart attack Mother   . Anesthesia problems Neg Hx   . Hypotension Neg Hx   . Malignant hyperthermia Neg Hx   . Pseudochol deficiency Neg Hx     SOCHx:   reports that he has never smoked. He has never used smokeless tobacco. He reports that he does not drink alcohol or use drugs.  ALLERGIES:  Allergies  Allergen Reactions  . Tape Other (See Comments)    Adhesive tape blisters  . Erythromycin Rash  .  Sulfa Antibiotics Rash  . Tetracyclines & Related Rash    ROS: Pertinent items noted in HPI and remainder of comprehensive ROS otherwise negative.  HOME MEDS: Current Outpatient Prescriptions  Medication Sig Dispense Refill  . albuterol (PROVENTIL HFA;VENTOLIN HFA) 108 (90 BASE) MCG/ACT inhaler Inhale 2 puffs into the lungs every 6 (six) hours as needed for wheezing or shortness of breath. 1 Inhaler 3  . Ascorbic Acid (VITAMIN C) 500 MG tablet Take 1 tablet (500 mg total) by mouth daily. 30 tablet   . aspirin EC 81 MG tablet Take 81 mg by mouth daily.    . pantoprazole (PROTONIX) 40 MG tablet TAKE 1 TABLET ONCE DAILY. 90 tablet 0  . potassium chloride SA (K-DUR,KLOR-CON) 20 MEQ tablet TAKE 1 TABLET ONCE DAILY. 90 tablet 0  . metoprolol succinate (TOPROL XL) 25 MG 24 hr tablet Take 1 tablet (25 mg  total) by mouth daily. 90 tablet 1   No current facility-administered medications for this visit.     LABS/IMAGING: No results found for this or any previous visit (from the past 48 hour(s)). No results found.  WEIGHTS: Wt Readings from Last 3 Encounters:  11/23/16 236 lb 12.8 oz (107.4 kg)  08/28/16 230 lb (104.3 kg)  08/21/16 235 lb (106.6 kg)    VITALS: BP 122/70   Pulse (!) 58   Ht 6\' 4"  (1.93 m)   Wt 236 lb 12.8 oz (107.4 kg)   BMI 28.82 kg/m   EXAM: General appearance: alert and no distress Neck: no carotid bruit and no JVD Lungs: clear to auscultation bilaterally Heart: regular rate and rhythm, S1, S2 normal, no murmur, click, rub or gallop Abdomen: soft, non-tender; bowel sounds normal; no masses,  no organomegaly Extremities: extremities normal, atraumatic, no cyanosis or edema Pulses: 2+ and symmetric Skin: Skin color, texture, turgor normal. No rashes or lesions Neurologic: Grossly normal Psych: Pleasant  EKG: Sinus bradycardia with PVCs, RBBB at 58  ASSESSMENT: 1. PVCs - symptomatic 2. RBBB 3. Hypertension 4. Chest wall pain - resolved  PLAN: 1.   [name] denies any recurrent chest pain. Blood pressure is well-controlled. He is only on once daily metoprolol tartrate. He understands that this may not give him complete coverage with blood pressure and suppressing PVCs throughout the day. I'd prefer to switch him to Toprol-XL 25 mg for him to take once daily. Otherwise, he seems to be doing well.  Follow-up in 6 months.  Pixie Casino, MD, [facility] Attending Cardiologist [place] C Hilty 11/23/2016, 1:00 PM

## 2016-12-15 DIAGNOSIS — J3489 Other specified disorders of nose and nasal sinuses: Secondary | ICD-10-CM | POA: Diagnosis not present

## 2016-12-15 DIAGNOSIS — J209 Acute bronchitis, unspecified: Secondary | ICD-10-CM | POA: Diagnosis not present

## 2016-12-21 ENCOUNTER — Ambulatory Visit (INDEPENDENT_AMBULATORY_CARE_PROVIDER_SITE_OTHER): Payer: Medicare Other

## 2016-12-21 ENCOUNTER — Ambulatory Visit (INDEPENDENT_AMBULATORY_CARE_PROVIDER_SITE_OTHER): Payer: Medicare Other | Admitting: Orthopaedic Surgery

## 2016-12-21 ENCOUNTER — Encounter (INDEPENDENT_AMBULATORY_CARE_PROVIDER_SITE_OTHER): Payer: Self-pay | Admitting: Orthopaedic Surgery

## 2016-12-21 VITALS — BP 118/71 | HR 79 | Ht 76.0 in | Wt 255.0 lb

## 2016-12-21 DIAGNOSIS — Z96652 Presence of left artificial knee joint: Secondary | ICD-10-CM

## 2016-12-21 MED ORDER — LIDOCAINE HCL 1 % IJ SOLN
3.0000 mL | INTRAMUSCULAR | Status: AC | PRN
  Administered 2016-12-21: 3 mL

## 2016-12-21 MED ORDER — BUPIVACAINE HCL 0.5 % IJ SOLN
2.0000 mL | INTRAMUSCULAR | Status: AC | PRN
  Administered 2016-12-21: 2 mL via INTRA_ARTICULAR

## 2016-12-21 MED ORDER — METHYLPREDNISOLONE ACETATE 40 MG/ML IJ SUSP
40.0000 mg | INTRAMUSCULAR | Status: AC | PRN
  Administered 2016-12-21: 40 mg via INTRA_ARTICULAR

## 2016-12-21 NOTE — Progress Notes (Signed)
Office Visit Note   Patient: [name]           Date of Birth: 1935/03/16           MRN: [id number] Visit Date: 12/21/2016              Requested by: L.Donnie Coffin, MD [place] [address] [place], [place] [address] PCP: Donnie Coffin, MD   Assessment & Plan: Visit Diagnoses:  1. Presence of left artificial knee joint   2. S/P total knee replacement using cement, left     Plan:  #1: Injection of the area of pain and clicking was performed. Tolerated procedure well. Still some discomfort at the area. #2: Continue with activities if it worsens he'll return  Follow-Up Instructions: Return in about 6 months (around 06/22/2017).   Orders:  Orders Placed This Encounter  Procedures  . XR Knee Complete 4 Views Left   No orders of the defined types were placed in this encounter.     Procedures: Large Joint Inj Date/Time: 12/21/2016 4:04 PM Performed by: Biagio Borg D Authorized by: Biagio Borg D   Consent Given by:  Patient Site marked: the procedure site was marked   Timeout: prior to procedure the correct patient, procedure, and site was verified   Indications:  Pain and joint swelling Location:  Knee Site:  L knee Prep: patient was prepped and draped in usual sterile fashion   Needle Size:  25 G Needle Length:  1.5 inches Approach:  Lateral Ultrasound Guidance: No   Fluoroscopic Guidance: No   Arthrogram: No   Medications:  3 mL lidocaine 1 %; 2 mL bupivacaine 0.5 %; 40 mg methylPREDNISolone acetate 40 MG/ML Aspiration Attempted: No   Patient tolerance:  Patient tolerated the procedure well with no immediate complications  Injected at the site of pain.     Clinical Data: No additional findings.   Subjective: Chief Complaint  Patient presents with  . Left Knee - Pain    [name] is a 82 y o male status post 5 months Left total knee replacement. He relates his L knee does swell each day, a pain when bending his knee more than 90  degrees "clicking" sound, and on the inside of the L knee.    [name] is 5 months status post left total knee arthroplasty. States he's been having some swelling in the knee. He does have some pain with flexion when he gets more than 90. His pain is on the lateral aspect of the femoral condyle. This is above the joint line. Denies any recent history of injury or trauma. He has been doing activity and has occasionally had this pain but he states is getting more frequent. However he was out golfing yesterday and had no pain at all before during or after.    Review of Systems  Constitutional: Negative.   HENT: Negative.   Respiratory: Negative.   Cardiovascular: Negative.   Gastrointestinal: Negative.   Genitourinary: Negative.   Skin: Negative.   Neurological: Negative.   Hematological: Negative.   Psychiatric/Behavioral: Negative.      Objective: Vital Signs: BP 118/71   Pulse 79   Ht 6\' 4"  (1.93 m)   Wt 255 lb (115.7 kg)   BMI 31.04 kg/m   Physical Exam  Constitutional: He is oriented to person, place, and time. He appears well-developed and well-nourished.  HENT:  Head: Normocephalic and atraumatic.  Eyes: EOM are normal. Pupils are equal, round, and reactive to  light.  Pulmonary/Chest: Effort normal.  Musculoskeletal:       Left knee: He exhibits effusion.  Neurological: He is alert and oriented to person, place, and time.  Skin: Skin is warm and dry.  Psychiatric: He has a normal mood and affect. His behavior is normal. Judgment and thought content normal.    Left Knee Exam   Tenderness  The patient is experiencing tenderness in the lateral joint line.  Range of Motion  Extension: 0  Flexion: 120   Tests  Mc[place]:  Medial - negative Lateral - negative Lachman:  Anterior - negative    Posterior - negative Drawer:       Anterior - negative     Posterior - negative Varus: negative Valgus: negative  Other  Scars: present Sensation: normal Pulse:  present Swelling: mild Effusion: effusion present  Comments:  No palpable crepitance at this time.      Specialty Comments:  No specialty comments available.  Imaging: Xr Knee Complete 4 Views Left  Result Date: 12/21/2016 X-ray of the left total knee replacement reveals excellent position alignment of the prosthesis. Marker was placed where his pain is and there is no calcification or  Cement. No signs of fracture. No radiolucencies noted.    PMFS History: Patient Active Problem List   Diagnosis Date Noted  . Trochanteric Bursitis of right hip 07/18/2011    Priority: High  . S/P total knee replacement using cement, left 08/01/2016  . RBBB (right bundle branch block with left anterior fascicular block) 04/12/2016  . PVC's (premature ventricular contractions) 04/12/2016  . Chest wall pain 04/12/2016  . Bronchial asthma 06/29/2014  . Urinary retention due to benign prostatic hyperplasia 04/03/2014  . Ileus, postoperative ([place]) 04/03/2014  . Osteoarthritis of right knee 04/01/2014  . S/P total knee replacement using cement 03/31/2014  . Skin rash 04/11/2013  . Pseudogout of knee 12/12/2012  . Dyspepsia 08/14/2012  . Benign hypertensive heart disease without heart failure 12/13/2010  . BPH (benign prostatic hyperplasia) 12/13/2010  . Unilateral primary osteoarthritis, left knee 12/13/2010  . Dyslipidemia 12/13/2010  . Status post cholecystectomy 12/13/2010   Past Medical History:  Diagnosis Date  . Arthritis   . Asthma    only if develops cold  . Dysrhythmia    h/o pvc's  . GERD (gastroesophageal reflux disease)   . Kidney stone on left side   . PVC (premature ventricular contraction)   . Recurrent upper respiratory infection (URI) 03/2011    Family History  Problem Relation Age of Onset  . Heart disease Mother   . Heart attack Mother   . Anesthesia problems Neg Hx   . Hypotension Neg Hx   . Malignant hyperthermia Neg Hx   . Pseudochol deficiency Neg Hx      Past Surgical History:  Procedure Laterality Date  . APPENDECTOMY    . BACK SURGERY     x 4  . CARDIAC CATHETERIZATION     july 2010-minor irregl  . CERVICAL SPINE SURGERY     x 2  . CHOLECYSTECTOMY     2008  . EYE SURGERY     bilat cataract  . INCISION AND DRAINAGE HIP  07/18/2011   Procedure: IRRIGATION AND DEBRIDEMENT HIP;  Surgeon: Garald Balding, MD;  Location: [facility];  Service: Orthopedics;  Laterality: Right;  RIGHT HIP EXPLORATION, EXCISION OF DEEP BURSAL SAC  . KIDNEY STONE SURGERY  2011   R  . KNEE ARTHROSCOPY     Left  .  SHOULDER ARTHROSCOPY     Left  . TOTAL HIP ARTHROPLASTY     Right  . TOTAL HIP REVISION     Right  . TOTAL KNEE ARTHROPLASTY Right 03/31/2014   Procedure: TOTAL KNEE ARTHROPLASTY;  Surgeon: Garald Balding, MD;  Location: [place];  Service: Orthopedics;  Laterality: Right;  . TOTAL KNEE ARTHROPLASTY Left 08/01/2016   Procedure: TOTAL KNEE ARTHROPLASTY;  Surgeon: Garald Balding, MD;  Location: [place];  Service: Orthopedics;  Laterality: Left;  . WRIST SURGERY     bilateral, plates   Social History   Occupational History  . Not on file.   Social History Main Topics  . Smoking status: Never Smoker  . Smokeless tobacco: Never Used  . Alcohol use No  . Drug use: No  . Sexual activity: Not on file

## 2016-12-22 ENCOUNTER — Ambulatory Visit (INDEPENDENT_AMBULATORY_CARE_PROVIDER_SITE_OTHER): Payer: Medicare Other | Admitting: Orthopaedic Surgery

## 2017-02-11 ENCOUNTER — Other Ambulatory Visit: Payer: Self-pay | Admitting: Internal Medicine

## 2017-02-12 DIAGNOSIS — Z961 Presence of intraocular lens: Secondary | ICD-10-CM | POA: Diagnosis not present

## 2017-02-12 DIAGNOSIS — H52203 Unspecified astigmatism, bilateral: Secondary | ICD-10-CM | POA: Diagnosis not present

## 2017-02-12 DIAGNOSIS — H43813 Vitreous degeneration, bilateral: Secondary | ICD-10-CM | POA: Diagnosis not present

## 2017-02-12 DIAGNOSIS — H02831 Dermatochalasis of right upper eyelid: Secondary | ICD-10-CM | POA: Diagnosis not present

## 2017-05-01 DIAGNOSIS — R002 Palpitations: Secondary | ICD-10-CM | POA: Diagnosis not present

## 2017-05-01 DIAGNOSIS — K219 Gastro-esophageal reflux disease without esophagitis: Secondary | ICD-10-CM | POA: Diagnosis not present

## 2017-05-01 DIAGNOSIS — M62838 Other muscle spasm: Secondary | ICD-10-CM | POA: Diagnosis not present

## 2017-05-01 DIAGNOSIS — Z0000 Encounter for general adult medical examination without abnormal findings: Secondary | ICD-10-CM | POA: Diagnosis not present

## 2017-05-09 ENCOUNTER — Telehealth (INDEPENDENT_AMBULATORY_CARE_PROVIDER_SITE_OTHER): Payer: Self-pay | Admitting: Orthopaedic Surgery

## 2017-05-09 NOTE — Telephone Encounter (Signed)
Patient having some dental work coming up on 05/14/17, and would like an rx for antibiotics called in if he needs one for Amoxicillin to [facility]. Please call to advise.

## 2017-05-10 ENCOUNTER — Other Ambulatory Visit (INDEPENDENT_AMBULATORY_CARE_PROVIDER_SITE_OTHER): Payer: Self-pay

## 2017-05-10 MED ORDER — AMOXICILLIN 500 MG PO TABS: ORAL_TABLET | ORAL | 0 refills | Status: DC

## 2017-05-10 NOTE — Telephone Encounter (Signed)
done

## 2017-05-30 DIAGNOSIS — R7301 Impaired fasting glucose: Secondary | ICD-10-CM | POA: Diagnosis not present

## 2017-05-31 ENCOUNTER — Ambulatory Visit (INDEPENDENT_AMBULATORY_CARE_PROVIDER_SITE_OTHER): Payer: Medicare Other | Admitting: Internal Medicine

## 2017-05-31 ENCOUNTER — Encounter: Payer: Self-pay | Admitting: Internal Medicine

## 2017-05-31 VITALS — BP 134/76 | HR 52 | Ht 76.0 in | Wt 234.8 lb

## 2017-05-31 DIAGNOSIS — I119 Hypertensive heart disease without heart failure: Secondary | ICD-10-CM

## 2017-05-31 DIAGNOSIS — K219 Gastro-esophageal reflux disease without esophagitis: Secondary | ICD-10-CM

## 2017-05-31 DIAGNOSIS — I493 Ventricular premature depolarization: Secondary | ICD-10-CM | POA: Diagnosis not present

## 2017-05-31 DIAGNOSIS — I452 Bifascicular block: Secondary | ICD-10-CM | POA: Diagnosis not present

## 2017-05-31 MED ORDER — PANTOPRAZOLE SODIUM 40 MG PO TBEC: 40.0000 mg | DELAYED_RELEASE_TABLET | Freq: Two times a day (BID) | ORAL | 1 refills | Status: DC

## 2017-05-31 NOTE — Progress Notes (Signed)
OFFICE NOTE  Chief Complaint:  Recent chest pain  Primary Care Physician: [name], George Sa, MD  HPI:  [name] is a 82 y.o. male who is a former patient of Dr. Mare Ferrari. His wife is also a patient of mine. He has a history of PVCs and is on medicine for suppression of those PVCs and also possible hypertension. Blood pressure initially 155/83 became down to 120/78. He was noted to have a few PVCs today but said he did not take his medicine this morning. Other than that he denies any medical problems. He has a history of multiple orthopedic surgeries and gallbladder surgery in the past. He has a chronic left-sided chest pain which is sharp and he can point to with his finger. This is along the left sternal border and generally does not move, worsened with exertion or is not relieved by rest. He's had 2 heart catheterizations last 5 years, neither study indicated any significant obstructive coronary disease.  11/23/2016  [name] returns today for follow-up. He denies any chest pain or worsening shortness of breath. He does have some PVCs however he is unaware of them. He is on once daily metoprolol tartrate. Blood pressure is at goal today 122/70.  05/31/2017  [name] was seen today in follow-up. He reports 2 episodes of recent chest pain. One episode was associated with stomach upset and pain that radiated up the center of the chest to his neck. He had some associated nausea and diaphoresis. He had a second episode which might of occurred after a spicy meal that was similar although he ended up vomiting after which she felt better. He denied any exercise intolerance, is able to exercise regularly, recently went on a long walking trip vacation and has no symptoms with this activity. He has a history of a false positive stress test in the past to prior cardiac catheterizations in the last 5 years which were negative.  PMHx:  Past Medical History:  Diagnosis Date  . Arthritis   .  Asthma    only if develops cold  . Dysrhythmia    h/o pvc's  . GERD (gastroesophageal reflux disease)   . Kidney stone on left side   . PVC (premature ventricular contraction)   . Recurrent upper respiratory infection (URI) 03/2011    Past Surgical History:  Procedure Laterality Date  . APPENDECTOMY    . BACK SURGERY     x 4  . CARDIAC CATHETERIZATION     july 2010-minor irregl  . CERVICAL SPINE SURGERY     x 2  . CHOLECYSTECTOMY     2008  . EYE SURGERY     bilat cataract  . INCISION AND DRAINAGE HIP  07/18/2011   Procedure: IRRIGATION AND DEBRIDEMENT HIP;  Surgeon: Garald Balding, MD;  Location: [facility];  Service: Orthopedics;  Laterality: Right;  RIGHT HIP EXPLORATION, EXCISION OF DEEP BURSAL SAC  . KIDNEY STONE SURGERY  2011   R  . KNEE ARTHROSCOPY     Left  . SHOULDER ARTHROSCOPY     Left  . TOTAL HIP ARTHROPLASTY     Right  . TOTAL HIP REVISION     Right  . TOTAL KNEE ARTHROPLASTY Right 03/31/2014   Procedure: TOTAL KNEE ARTHROPLASTY;  Surgeon: Garald Balding, MD;  Location: [facility];  Service: Orthopedics;  Laterality: Right;  . TOTAL KNEE ARTHROPLASTY Left 08/01/2016   Procedure: TOTAL KNEE ARTHROPLASTY;  Surgeon: Garald Balding, MD;  Location: [facility];  Service: Orthopedics;  Laterality: Left;  . WRIST SURGERY     bilateral, plates    FAMHx:  Family History  Problem Relation Age of Onset  . Heart disease Mother   . Heart attack Mother   . Anesthesia problems Neg Hx   . Hypotension Neg Hx   . Malignant hyperthermia Neg Hx   . Pseudochol deficiency Neg Hx     SOCHx:   reports that he has never smoked. He has never used smokeless tobacco. He reports that he does not drink alcohol or use drugs.  ALLERGIES:  Allergies  Allergen Reactions  . Tape Other (See Comments)    Adhesive tape blisters  . Erythromycin Rash  . Sulfa Antibiotics Rash  . Tetracyclines & Related Rash    ROS: Pertinent items noted in HPI and remainder of comprehensive ROS  otherwise negative.  HOME MEDS: Current Outpatient Prescriptions  Medication Sig Dispense Refill  . albuterol (PROVENTIL HFA;VENTOLIN HFA) 108 (90 BASE) MCG/ACT inhaler Inhale 2 puffs into the lungs every 6 (six) hours as needed for wheezing or shortness of breath. 1 Inhaler 3  . amoxicillin (AMOXIL) 500 MG tablet Take 4 tablets one hour prior to any dental work 12 tablet 0  . Ascorbic Acid (VITAMIN C) 500 MG tablet Take 1 tablet (500 mg total) by mouth daily. 30 tablet   . aspirin EC 81 MG tablet Take 81 mg by mouth daily.    . metoprolol succinate (TOPROL XL) 25 MG 24 hr tablet Take 1 tablet (25 mg total) by mouth daily. 90 tablet 1  . pantoprazole (PROTONIX) 40 MG tablet Take 1 tablet (40 mg total) by mouth 2 (two) times daily. 180 tablet 1  . potassium chloride Christian (K-DUR,KLOR-CON) 20 MEQ tablet TAKE 1 TABLET ONCE DAILY. 90 tablet 2   No current facility-administered medications for this visit.     LABS/IMAGING: No results found for this or any previous visit (from the past 48 hour(s)). No results found.  WEIGHTS: Wt Readings from Last 3 Encounters:  05/31/17 234 lb 12.8 oz (106.5 kg)  12/21/16 255 lb (115.7 kg)  11/23/16 236 lb 12.8 oz (107.4 kg)    VITALS: BP 134/76   Pulse (!) 52   Ht 6\' 4"  (1.93 m)   Wt 234 lb 12.8 oz (106.5 kg)   BMI 28.58 kg/m   EXAM: General appearance: alert and no distress Neck: no carotid bruit and no JVD Lungs: clear to auscultation bilaterally Heart: regular rate and rhythm, S1, S2 normal, no murmur, click, rub or gallop Abdomen: soft, non-tender; bowel sounds normal; no masses,  no organomegaly Extremities: extremities normal, atraumatic, no cyanosis or edema Pulses: 2+ and symmetric Skin: Skin color, texture, turgor normal. No rashes or lesions Neurologic: Grossly normal Psych: Pleasant  EKG: Sinus bradycardia 52, RBBB  ASSESSMENT: 1. Atypical chest pain-suspect GERD 2. PVCs - symptomatic 3. RBBB 4. Hypertension  PLAN: 1.    Mr. Skeels had 2 episodes of chest pain, epigastric upset and nausea, vomiting and diaphoresis after which improved quickly.  The symptoms are atypical for coronary disease and he is noted to have had 2 negative heart catheterizations on last 5 years. I suspect this is GERD. He is on protonic some I advised him to increase that to twice daily. He may need GI evaluation if this does not improve. He has a history of cholelithiasis and is status post cholecystectomy.   Follow-up in 6 months or sooner if symptoms return or worsen, in which time we  may consider repeat stress testing.  Pixie Casino, MD, [facility] Attending Cardiologist Donora C Floyd Wade 05/31/2017, 2:33 PM

## 2017-05-31 NOTE — Patient Instructions (Addendum)
Your physician wants you to follow-up in: [name]. You will receive a reminder letter in the mail two months in advance. If you don't receive a letter, please call our office to schedule the follow-up appointment.  Your physician has recommended you make the following change in your medication: INCREASE pantoprazole to twice daily

## 2017-06-12 ENCOUNTER — Ambulatory Visit (INDEPENDENT_AMBULATORY_CARE_PROVIDER_SITE_OTHER): Payer: Medicare Other | Admitting: Orthopaedic Surgery

## 2017-06-12 ENCOUNTER — Encounter (INDEPENDENT_AMBULATORY_CARE_PROVIDER_SITE_OTHER): Payer: Self-pay | Admitting: Orthopaedic Surgery

## 2017-06-12 VITALS — BP 120/82 | HR 70 | Resp 14 | Ht 74.0 in | Wt 234.0 lb

## 2017-06-12 DIAGNOSIS — Z96652 Presence of left artificial knee joint: Secondary | ICD-10-CM | POA: Diagnosis not present

## 2017-06-12 MED ORDER — LIDOCAINE HCL 1 % IJ SOLN
5.0000 mL | INTRAMUSCULAR | Status: AC | PRN
  Administered 2017-06-12: 5 mL

## 2017-06-12 MED ORDER — METHYLPREDNISOLONE ACETATE 40 MG/ML IJ SUSP
80.0000 mg | INTRAMUSCULAR | Status: AC | PRN
  Administered 2017-06-12: 80 mg

## 2017-06-12 MED ORDER — BUPIVACAINE HCL 0.5 % IJ SOLN
3.0000 mL | INTRAMUSCULAR | Status: AC | PRN
  Administered 2017-06-12: 3 mL via INTRA_ARTICULAR

## 2017-06-12 NOTE — Progress Notes (Signed)
Office Visit Note   Patient: [name]           Date of Birth: 1935-04-30           MRN: [id number] Visit Date: 06/12/2017              Requested by: [name], L.Marlou Sa, [place] [address] [employer] [address] [place], [place] [address] PCP: [name], L.Marlou Sa, MD   Assessment & Plan: Visit Diagnoses:  1. History of left knee replacement     Plan: Nearly 1 year status post uncomplicated left total knee replacement. Doing very well except has an area of local tenderness along the lateral joint that bothers him at night. No functional loss. We will inject that local area with cortisone and monitor his response  Follow-Up Instructions: Return if symptoms worsen or fail to improve.   Orders:  Orders Placed This Encounter  Procedures  . Large Joint Injection/Arthrocentesis   No orders of the defined types were placed in this encounter.     Procedures: Large Joint Inj Date/Time: 06/12/2017 3:42 PM Performed by: Garald Balding Authorized by: Garald Balding   Consent Given by:  Patient Timeout: prior to procedure the correct patient, procedure, and site was verified   Indications:  Pain and joint swelling Location:  Knee Site:  L knee Prep: patient was prepped and draped in usual sterile fashion   Needle Size:  25 G Needle Length:  1.5 inches Approach:  Anteromedial Ultrasound Guidance: No   Fluoroscopic Guidance: No   Arthrogram: No   Medications:  5 mL lidocaine 1 %; 80 mg methylPREDNISolone acetate 40 MG/ML; 3 mL bupivacaine 0.5 % Aspiration Attempted: No   Patient tolerance:  Patient tolerated the procedure well with no immediate complications     Clinical Data: No additional findings.   Subjective: Chief Complaint  Patient presents with  . Left Knee - Routine Post Op    [name] is an 82 y o S/P 11 months L TKA. Pt doing well except once spot  That wakes him up at night.  Denies fever or chills. No limp. No significant problems with his knee except a  localized area of tenderness at night. He's able to play golf hike walk without any problems.  HPI  Review of Systems  Constitutional: Negative for fatigue.  HENT: Negative for hearing loss.   Respiratory: Negative for apnea, chest tightness and shortness of breath.   Cardiovascular: Negative for chest pain, palpitations and leg swelling.  Gastrointestinal: Negative for blood in stool, constipation and diarrhea.  Genitourinary: Negative for difficulty urinating.  Musculoskeletal: Negative for arthralgias, back pain, joint swelling, myalgias, neck pain and neck stiffness.  Neurological: Negative for weakness, numbness and headaches.  Hematological: Does not bruise/bleed easily.  Psychiatric/Behavioral: Negative for sleep disturbance. The patient is not nervous/anxious.      Objective: Vital Signs: BP 120/82   Pulse 70   Resp 14   Ht 6\' 2"  (1.88 m)   Wt 234 lb (106.1 kg)   BMI 30.04 kg/m   Physical Exam  Ortho Exam awake alert and oriented 3 comfortable sitting. I had him walk in the office without any limp. His left knee is not the  least bit uncomfortable. It wasn't hot warm or red. He had an area along the lateral joint line that was a little uncomfortable with  bit of a pop and click as if there was some scar plate,. There is no grinding or noise or evidence of any  instability.  very minimal effusion but again knee was not warm or hot fully. Full extension over 120 of flexion. No popliteal pain. No calf discomfort. Neurovascular exam intact. 3 leg raise negative bilaterally.  Specialty Comments:  No specialty comments available.  Imaging: No results found.   PMFS History: Patient Active Problem List   Diagnosis Date Noted  . Gastroesophageal reflux disease 05/31/2017  . S/P total knee replacement using cement, left 08/01/2016  . RBBB (right bundle branch block with left anterior fascicular block) 04/12/2016  . PVC's (premature ventricular contractions) 04/12/2016  .  Chest wall pain 04/12/2016  . Bronchial asthma 06/29/2014  . Urinary retention due to benign prostatic hyperplasia 04/03/2014  . Ileus, postoperative ([place]) 04/03/2014  . Osteoarthritis of right knee 04/01/2014  . S/P total knee replacement using cement 03/31/2014  . Skin rash 04/11/2013  . Pseudogout of knee 12/12/2012  . Dyspepsia 08/14/2012  . Trochanteric Bursitis of right hip 07/18/2011  . Benign hypertensive heart disease without heart failure 12/13/2010  . BPH (benign prostatic hyperplasia) 12/13/2010  . Unilateral primary osteoarthritis, left knee 12/13/2010  . Dyslipidemia 12/13/2010  . Status post cholecystectomy 12/13/2010   Past Medical History:  Diagnosis Date  . Arthritis   . Asthma    only if develops cold  . Dysrhythmia    h/o pvc's  . GERD (gastroesophageal reflux disease)   . Kidney stone on left side   . PVC (premature ventricular contraction)   . Recurrent upper respiratory infection (URI) 03/2011    Family History  Problem Relation Age of Onset  . Heart disease Mother   . Heart attack Mother   . Anesthesia problems Neg Hx   . Hypotension Neg Hx   . Malignant hyperthermia Neg Hx   . Pseudochol deficiency Neg Hx     Past Surgical History:  Procedure Laterality Date  . APPENDECTOMY    . BACK SURGERY     x 4  . CARDIAC CATHETERIZATION     july 2010-minor irregl  . CERVICAL SPINE SURGERY     x 2  . CHOLECYSTECTOMY     2008  . EYE SURGERY     bilat cataract  . INCISION AND DRAINAGE HIP  07/18/2011   Procedure: IRRIGATION AND DEBRIDEMENT HIP;  Surgeon: Garald Balding, MD;  Location: [facility];  Service: Orthopedics;  Laterality: Right;  RIGHT HIP EXPLORATION, EXCISION OF DEEP BURSAL SAC  . KIDNEY STONE SURGERY  2011   R  . KNEE ARTHROSCOPY     Left  . SHOULDER ARTHROSCOPY     Left  . TOTAL HIP ARTHROPLASTY     Right  . TOTAL HIP REVISION     Right  . TOTAL KNEE ARTHROPLASTY Right 03/31/2014   Procedure: TOTAL KNEE ARTHROPLASTY;  Surgeon: Garald Balding, MD;  Location: [facility];  Service: Orthopedics;  Laterality: Right;  . TOTAL KNEE ARTHROPLASTY Left 08/01/2016   Procedure: TOTAL KNEE ARTHROPLASTY;  Surgeon: Garald Balding, MD;  Location: [facility];  Service: Orthopedics;  Laterality: Left;  . WRIST SURGERY     bilateral, plates   Social History   Occupational History  . Not on file.   Social History Main Topics  . Smoking status: Never Smoker  . Smokeless tobacco: Never Used  . Alcohol use No  . Drug use: No  . Sexual activity: Not on file

## 2017-06-22 ENCOUNTER — Ambulatory Visit (INDEPENDENT_AMBULATORY_CARE_PROVIDER_SITE_OTHER): Payer: Medicare Other | Admitting: Orthopaedic Surgery

## 2017-07-04 DIAGNOSIS — Z9049 Acquired absence of other specified parts of digestive tract: Secondary | ICD-10-CM | POA: Diagnosis not present

## 2017-07-04 DIAGNOSIS — I493 Ventricular premature depolarization: Secondary | ICD-10-CM | POA: Diagnosis not present

## 2017-07-04 DIAGNOSIS — R1013 Epigastric pain: Secondary | ICD-10-CM | POA: Diagnosis not present

## 2017-07-05 ENCOUNTER — Other Ambulatory Visit: Payer: Self-pay | Admitting: Gastroenterology

## 2017-07-05 DIAGNOSIS — R1013 Epigastric pain: Secondary | ICD-10-CM

## 2017-07-14 ENCOUNTER — Ambulatory Visit
Admission: RE | Admit: 2017-07-14 | Discharge: 2017-07-14 | Disposition: A | Discharge status: Home / Self Care | Payer: Medicare Other | Source: Ambulatory Visit | Attending: Gastroenterology | Admitting: Gastroenterology

## 2017-07-14 ENCOUNTER — Other Ambulatory Visit: Payer: Self-pay | Admitting: Gastroenterology

## 2017-07-14 DIAGNOSIS — R1013 Epigastric pain: Secondary | ICD-10-CM

## 2017-07-17 ENCOUNTER — Ambulatory Visit
Admission: RE | Admit: 2017-07-17 | Discharge: 2017-07-17 | Disposition: A | Discharge status: Home / Self Care | Payer: Medicare Other | Source: Ambulatory Visit | Attending: Gastroenterology | Admitting: Gastroenterology

## 2017-07-17 DIAGNOSIS — R932 Abnormal findings on diagnostic imaging of liver and biliary tract: Secondary | ICD-10-CM | POA: Diagnosis not present

## 2017-07-17 DIAGNOSIS — K8020 Calculus of gallbladder without cholecystitis without obstruction: Secondary | ICD-10-CM | POA: Diagnosis not present

## 2017-07-17 DIAGNOSIS — R1013 Epigastric pain: Secondary | ICD-10-CM

## 2017-07-17 MED ORDER — GADOBENATE DIMEGLUMINE 529 MG/ML IV SOLN
20.0000 mL | Freq: Once | INTRAVENOUS | Status: AC | PRN
  Administered 2017-07-17: 20 mL via INTRAVENOUS

## 2017-07-19 ENCOUNTER — Other Ambulatory Visit: Payer: Self-pay | Admitting: Gastroenterology

## 2017-07-23 ENCOUNTER — Other Ambulatory Visit: Payer: Self-pay

## 2017-07-23 ENCOUNTER — Encounter (HOSPITAL_COMMUNITY): Payer: Self-pay | Admitting: Emergency Medicine

## 2017-07-23 ENCOUNTER — Other Ambulatory Visit: Payer: Self-pay | Admitting: Gastroenterology

## 2017-07-23 DIAGNOSIS — R932 Abnormal findings on diagnostic imaging of liver and biliary tract: Secondary | ICD-10-CM | POA: Diagnosis not present

## 2017-07-24 ENCOUNTER — Encounter (HOSPITAL_COMMUNITY): Payer: Self-pay

## 2017-07-24 ENCOUNTER — Other Ambulatory Visit: Payer: Self-pay

## 2017-07-24 ENCOUNTER — Ambulatory Visit (HOSPITAL_COMMUNITY): Payer: Medicare Other | Admitting: Anesthesiology

## 2017-07-24 ENCOUNTER — Ambulatory Visit (HOSPITAL_COMMUNITY)
Admission: RE | Admit: 2017-07-24 | Discharge: 2017-07-24 | Disposition: A | Discharge status: Home / Self Care | Payer: Medicare Other | Source: Ambulatory Visit | Attending: Gastroenterology | Admitting: Gastroenterology

## 2017-07-24 ENCOUNTER — Encounter (HOSPITAL_COMMUNITY)
Admission: RE | Disposition: A | Discharge status: Home / Self Care | Payer: Self-pay | Source: Ambulatory Visit | Attending: Gastroenterology

## 2017-07-24 ENCOUNTER — Ambulatory Visit (HOSPITAL_COMMUNITY): Payer: Medicare Other

## 2017-07-24 DIAGNOSIS — G43909 Migraine, unspecified, not intractable, without status migrainosus: Secondary | ICD-10-CM | POA: Diagnosis not present

## 2017-07-24 DIAGNOSIS — Z8249 Family history of ischemic heart disease and other diseases of the circulatory system: Secondary | ICD-10-CM | POA: Diagnosis not present

## 2017-07-24 DIAGNOSIS — Z87442 Personal history of urinary calculi: Secondary | ICD-10-CM | POA: Diagnosis not present

## 2017-07-24 DIAGNOSIS — Z96641 Presence of right artificial hip joint: Secondary | ICD-10-CM | POA: Diagnosis not present

## 2017-07-24 DIAGNOSIS — J45909 Unspecified asthma, uncomplicated: Secondary | ICD-10-CM | POA: Insufficient documentation

## 2017-07-24 DIAGNOSIS — Z882 Allergy status to sulfonamides status: Secondary | ICD-10-CM | POA: Insufficient documentation

## 2017-07-24 DIAGNOSIS — Z888 Allergy status to other drugs, medicaments and biological substances status: Secondary | ICD-10-CM | POA: Diagnosis not present

## 2017-07-24 DIAGNOSIS — Z8371 Family history of colonic polyps: Secondary | ICD-10-CM | POA: Diagnosis not present

## 2017-07-24 DIAGNOSIS — M1990 Unspecified osteoarthritis, unspecified site: Secondary | ICD-10-CM | POA: Insufficient documentation

## 2017-07-24 DIAGNOSIS — Z881 Allergy status to other antibiotic agents status: Secondary | ICD-10-CM | POA: Insufficient documentation

## 2017-07-24 DIAGNOSIS — K219 Gastro-esophageal reflux disease without esophagitis: Secondary | ICD-10-CM | POA: Insufficient documentation

## 2017-07-24 DIAGNOSIS — I4510 Unspecified right bundle-branch block: Secondary | ICD-10-CM | POA: Diagnosis not present

## 2017-07-24 DIAGNOSIS — Z96653 Presence of artificial knee joint, bilateral: Secondary | ICD-10-CM | POA: Insufficient documentation

## 2017-07-24 DIAGNOSIS — Z800 Family history of malignant neoplasm of digestive organs: Secondary | ICD-10-CM | POA: Diagnosis not present

## 2017-07-24 DIAGNOSIS — K8020 Calculus of gallbladder without cholecystitis without obstruction: Secondary | ICD-10-CM | POA: Diagnosis not present

## 2017-07-24 DIAGNOSIS — K9189 Other postprocedural complications and disorders of digestive system: Secondary | ICD-10-CM | POA: Diagnosis not present

## 2017-07-24 DIAGNOSIS — K8050 Calculus of bile duct without cholangitis or cholecystitis without obstruction: Secondary | ICD-10-CM | POA: Diagnosis not present

## 2017-07-24 DIAGNOSIS — K831 Obstruction of bile duct: Secondary | ICD-10-CM | POA: Diagnosis not present

## 2017-07-24 DIAGNOSIS — R198 Other specified symptoms and signs involving the digestive system and abdomen: Secondary | ICD-10-CM

## 2017-07-24 HISTORY — PX: ERCP: SHX5425

## 2017-07-24 SURGERY — ERCP, WITH INTERVENTION IF INDICATED
Anesthesia: General

## 2017-07-24 MED ORDER — CIPROFLOXACIN IN D5W 400 MG/200ML IV SOLN
INTRAVENOUS | Status: AC
  Filled 2017-07-24: qty 200

## 2017-07-24 MED ORDER — ONDANSETRON HCL 4 MG/2ML IJ SOLN
INTRAMUSCULAR | Status: DC | PRN
  Administered 2017-07-24: 4 mg via INTRAVENOUS

## 2017-07-24 MED ORDER — FENTANYL CITRATE (PF) 100 MCG/2ML IJ SOLN
INTRAMUSCULAR | Status: DC | PRN
  Administered 2017-07-24: 100 ug via INTRAVENOUS

## 2017-07-24 MED ORDER — SUGAMMADEX SODIUM 200 MG/2ML IV SOLN
INTRAVENOUS | Status: DC | PRN
  Administered 2017-07-24: 400 mg via INTRAVENOUS

## 2017-07-24 MED ORDER — SODIUM CHLORIDE 0.9 % IV SOLN: INTRAVENOUS | Status: DC

## 2017-07-24 MED ORDER — ONDANSETRON HCL 4 MG/2ML IJ SOLN
INTRAMUSCULAR | Status: AC
  Filled 2017-07-24: qty 2

## 2017-07-24 MED ORDER — GLYCOPYRROLATE 0.2 MG/ML IV SOSY
PREFILLED_SYRINGE | INTRAVENOUS | Status: DC | PRN
  Administered 2017-07-24: .2 mg via INTRAVENOUS

## 2017-07-24 MED ORDER — ROCURONIUM BROMIDE 10 MG/ML (PF) SYRINGE
PREFILLED_SYRINGE | INTRAVENOUS | Status: DC | PRN
  Administered 2017-07-24: 50 mg via INTRAVENOUS

## 2017-07-24 MED ORDER — PROPOFOL 10 MG/ML IV BOLUS
INTRAVENOUS | Status: DC | PRN
Start: 2017-07-24 — End: 2017-07-24
  Administered 2017-07-24: 140 mg via INTRAVENOUS

## 2017-07-24 MED ORDER — DEXAMETHASONE SODIUM PHOSPHATE 10 MG/ML IJ SOLN
INTRAMUSCULAR | Status: DC | PRN
  Administered 2017-07-24: 10 mg via INTRAVENOUS

## 2017-07-24 MED ORDER — SUGAMMADEX SODIUM 200 MG/2ML IV SOLN
INTRAVENOUS | Status: AC
  Filled 2017-07-24: qty 2

## 2017-07-24 MED ORDER — LIDOCAINE 2% (20 MG/ML) 5 ML SYRINGE
INTRAMUSCULAR | Status: DC | PRN
  Administered 2017-07-24: 100 mg via INTRAVENOUS

## 2017-07-24 MED ORDER — FENTANYL CITRATE (PF) 100 MCG/2ML IJ SOLN
INTRAMUSCULAR | Status: AC
  Filled 2017-07-24: qty 2

## 2017-07-24 MED ORDER — LACTATED RINGERS IV SOLN
INTRAVENOUS | Status: DC
  Administered 2017-07-24: 12:00:00 via INTRAVENOUS

## 2017-07-24 MED ORDER — CIPROFLOXACIN IN D5W 400 MG/200ML IV SOLN
400.0000 mg | Freq: Once | INTRAVENOUS | Status: AC
  Administered 2017-07-24: 400 mg via INTRAVENOUS

## 2017-07-24 MED ORDER — DEXAMETHASONE SODIUM PHOSPHATE 10 MG/ML IJ SOLN
INTRAMUSCULAR | Status: AC
  Filled 2017-07-24: qty 1

## 2017-07-24 MED ORDER — GLUCAGON HCL RDNA (DIAGNOSTIC) 1 MG IJ SOLR
INTRAMUSCULAR | Status: AC
  Filled 2017-07-24: qty 1

## 2017-07-24 MED ORDER — INDOMETHACIN 50 MG RE SUPP
RECTAL | Status: AC
  Filled 2017-07-24: qty 2

## 2017-07-24 NOTE — Anesthesia Postprocedure Evaluation (Signed)
Anesthesia Post Note  Patient: [name]  Procedure(s) Performed: ENDOSCOPIC RETROGRADE CHOLANGIOPANCREATOGRAPHY (ERCP) (N/A )     Patient location during evaluation: PACU Anesthesia Type: General Level of consciousness: awake and alert Pain management: pain level controlled Vital Signs Assessment: post-procedure vital signs reviewed and stable Respiratory status: spontaneous breathing, nonlabored ventilation, respiratory function stable and patient connected to nasal cannula oxygen Cardiovascular status: blood pressure returned to baseline and stable Postop Assessment: no apparent nausea or vomiting Anesthetic complications: no    Last Vitals:  Vitals:   07/24/17 1149 07/24/17 1340  BP: (!) 178/64 (!) 190/99  Pulse: 68 76  Resp: 16 16  Temp: 36.7 C 36.5 C  SpO2: 97% 100%    Last Pain:  Vitals:   07/24/17 1340  TempSrc: Oral                 Brittanya Winburn DAVID

## 2017-07-24 NOTE — Progress Notes (Signed)
[name] 12:44 PM  Subjective: Patient doing well and no problems since she was seen yesterday in the office except for another 15 minute attack after eating  Objective: Vital signs stable afebrile no acute distress exam please see preassessment evaluation  Assessment: MRCP with positive for CBD stone  Plan: Okay to proceed with ERCP with anesthesia assistance  [facility]  Pager [phone] After 5PM or if no answer call [phone]

## 2017-07-24 NOTE — Anesthesia Preprocedure Evaluation (Signed)
Anesthesia Evaluation  Patient identified by MRN, date of birth, ID band Patient awake    Reviewed: Allergy & Precautions, NPO status , Patient's Chart, lab work & pertinent test results  Airway Mallampati: I  TM Distance: >3 FB Neck ROM: Full    Dental   Pulmonary    Pulmonary exam normal        Cardiovascular Normal cardiovascular exam     Neuro/Psych    GI/Hepatic GERD  Medicated and Controlled,  Endo/Other    Renal/GU      Musculoskeletal   Abdominal   Peds  Hematology   Anesthesia Other Findings   Reproductive/Obstetrics                             Anesthesia Physical Anesthesia Plan  ASA: III  Anesthesia Plan: General   Post-op Pain Management:    Induction: Intravenous  PONV Risk Score and Plan: 2 and Ondansetron and Treatment may vary due to age or medical condition  Airway Management Planned: Oral ETT  Additional Equipment:   Intra-op Plan:   Post-operative Plan: Extubation in OR  Informed Consent: I have reviewed the patients History and Physical, chart, labs and discussed the procedure including the risks, benefits and alternatives for the proposed anesthesia with the patient or authorized representative who has indicated his/her understanding and acceptance.     Plan Discussed with: CRNA and Surgeon  Anesthesia Plan Comments:         Anesthesia Quick Evaluation

## 2017-07-24 NOTE — Transfer of Care (Signed)
Immediate Anesthesia Transfer of Care Note  Patient: [name]  Procedure(s) Performed: ENDOSCOPIC RETROGRADE CHOLANGIOPANCREATOGRAPHY (ERCP) (N/A )  Patient Location: PACU  Anesthesia Type:General  Level of Consciousness: awake, alert  and oriented  Airway & Oxygen Therapy: Patient Spontanous Breathing and Patient connected to face mask oxygen  Post-op Assessment: Report given to RN and Post -op Vital signs reviewed and stable  Post vital signs: Reviewed and stable  Last Vitals:  Vitals:   07/24/17 1149  BP: (!) 178/64  Pulse: 68  Resp: 16  Temp: 36.7 C  SpO2: 97%    Last Pain:  Vitals:   07/24/17 1149  TempSrc: Oral         Complications: No apparent anesthesia complications

## 2017-07-24 NOTE — Op Note (Signed)
[facility] Patient Name: [name] Procedure Date: 07/24/2017 MRN: [id number] Attending MD: Clarene Essex , MD Date of Birth: [birth year] CSN: [id number] Age: 82 Admit Type: Outpatient Procedure:                ERCP Indications:              Bile duct stone(s) Providers:                Clarene Essex, MD, Cleda Daub, RN, Elspeth Cho                            Tech., Technician, Arnoldo Hooker, CRNA Referring MD:              Medicines:                General Anesthesia Complications:            No immediate complications. Estimated Blood Loss:     Estimated blood loss: none. Procedure:                Pre-Anesthesia Assessment:                           - Prior to the procedure, a History and Physical                            was performed, and patient medications and                            allergies were reviewed. The patient's tolerance of                            previous anesthesia was also reviewed. The risks                            and benefits of the procedure and the sedation                            options and risks were discussed with the patient.                            All questions were answered, and informed consent                            was obtained. Prior Anticoagulants: The patient has                            taken no previous anticoagulant or antiplatelet                            agents. ASA Grade Assessment: II - A patient with                            mild systemic disease. After reviewing the risks  and benefits, the patient was deemed in                            satisfactory condition to undergo the procedure.                           After obtaining informed consent, the scope was                            passed under direct vision. Throughout the                            procedure, the patient's blood pressure, pulse, and                            oxygen saturations were monitored  continuously. The                            [id number] scope was introduced through the                            mouth, and used to inject contrast into and used to                            locate the major papilla. The ERCP was accomplished                            without difficulty. The patient tolerated the                            procedure well. Scope In: Scope Out: Findings:      The major papilla was normal. Bile did flow from the previous       sphincterotomy site which was small and deep selective cannulation was       easily obtained and at least a few obvious stones were seen on initial       cholangiogram and we proceeded with increasing The biliary       sphincterotomy which was extended with a Hydratome sphincterotome using       ERBE electrocautery. There was no post-sphincterotomy bleeding. We could       get the fully bowed sphincterotome easily in and out of the duct and       there was adequate biliary drainage and The common bile duct contained       few small stones, the largest of which was small in diameter. The       biliary tree was swept with a 12- 15 mm adjustable balloon starting at       the bifurcation. Sludge was swept from the duct. All stones were       removed. Multiple pull-through's were done using both sizes of balloons       which passed readily through the patent sphincterotomy site and Nothing       was found at the end of the procedure. We proceeded with an occlusion       cholangiogram in the customary fashion which did not reveal any  additional findings and there was adequate biliary drainage and the wire       and balloon catheter were removed and the patient tolerated the       procedure well Impression:               - The major papilla appeared normal.                           - Choledocholithiasis was found. Complete removal                            was accomplished by biliary sphincterotomy and                             balloon extraction.                           - A biliary sphincterotomy was performed.                           - The biliary tree was swept and nothing was found                            at the end of the procedure. There were no                            pancreatic duct injection or wire advancement                            throughout the procedure Moderate Sedation:      N/A- Per Anesthesia Care Recommendation:           - Patient has a contact number available for                            emergencies. The signs and symptoms of potential                            delayed complications were discussed with the                            patient. Return to normal activities tomorrow.                            Written discharge instructions were provided to the                            patient.                           - Clear liquid diet for 6 hours. Soft solids later                            tonight if doing okay                           -  Continue present medications.                           - Return to GI clinic in 4 weeks.                           - Telephone GI clinic if symptomatic PRN. Procedure Code(s):        --- Professional ---                           [phone], Esophagogastroduodenoscopy, flexible,                            transoral; diagnostic, including collection of                            specimen(s) by brushing or washing, when performed                            (separate procedure) Diagnosis Code(s):        --- Professional ---                           K80.50, Calculus of bile duct without cholangitis                            or cholecystitis without obstruction CPT copyright 2016 American Medical Association. All rights reserved. The codes documented in this report are preliminary and upon coder review may  be revised to meet current compliance requirements. Clarene Essex, MD 07/24/2017 1:31:03 PM This report has been signed  electronically. Number of Addenda: 0

## 2017-07-24 NOTE — Anesthesia Procedure Notes (Signed)
Procedure Name: Intubation Date/Time: 07/24/2017 1:10 PM Performed by: Lavina Hamman, CRNA Pre-anesthesia Checklist: Patient identified, Emergency Drugs available, Suction available, Patient being monitored and Timeout performed Patient Re-evaluated:Patient Re-evaluated prior to induction Oxygen Delivery Method: Circle system utilized Preoxygenation: Pre-oxygenation with 100% oxygen Induction Type: IV induction Ventilation: Mask ventilation without difficulty Laryngoscope Size: Mac and 4 Grade View: Grade I Tube type: Oral Tube size: 7.5 mm Number of attempts: 1 Airway Equipment and Method: Stylet Placement Confirmation: ETT inserted through vocal cords under direct vision,  positive ETCO2,  CO2 detector and breath sounds checked- equal and bilateral Secured at: 23 cm Tube secured with: Tape Dental Injury: Teeth and Oropharynx as per pre-operative assessment

## 2017-07-24 NOTE — Discharge Instructions (Signed)
Endoscopic Retrograde Cholangiopancreatogram, Care After This sheet gives you information about how to care for yourself after your procedure. Your health care provider may also give you more specific instructions. If you have problems or questions, contact your health care provider. What can I expect after the procedure? After the procedure, it is common to have:  Soreness in your throat.  Nausea.  Bloating.  Dizziness.  Tiredness (fatigue).  Follow these instructions at home:  Take over-the-counter and prescription medicines only as told by your health care provider.  Do not drive for 24 hours if you were given a medicine to help you relax (sedative) during your procedure. Have someone stay with you for 24 hours after the procedure.  Return to your normal activities as told by your health care provider. Ask your health care provider what activities are safe for you.  Return to eating what you normally do as soon as you feel well enough or as told by your health care provider.  Keep all follow-up visits as told by your health care provider. This is important. Contact a health care provider if:  You have pain in your abdomen that does not get better with medicine.  You develop signs of infection, such as: ? Chills. ? Feeling unwell. Get help right away if:  You have difficulty swallowing.  You have worsening pain in your throat, chest, or abdomen.  You vomit bright red blood or a substance that looks like coffee grounds.  You have bloody or very black stools.  You have a fever.  You have a sudden increase in swelling (bloating) in your abdomen. Summary  After the procedure, it is common to feel tired and to have some discomfort in your throat.  Contact your health care provider if you have signs of infection--such as chills or feeling unwell--or if you have pain that does not improve with medicine.  Get help right away if you have trouble swallowing, worsening  pain, bloody or black vomit, bloody or black stools, a fever, or increased swelling in your abdomen.  Keep all follow-up visits as told by your health care provider. This is important. This information is not intended to replace advice given to you by your health care provider. Make sure you discuss any questions you have with your health care provider. Document Released: 06/11/2013 Document Revised: 07/10/2016 Document Reviewed: 07/10/2016 Elsevier Interactive Patient Education  2017 [employer].   Call if question or problem otherwise clear liquids only until 6 PM and if doing well this evening may have soft solids and follow-up as needed and call as needed otherwise follow-up with me in one month

## 2017-07-25 ENCOUNTER — Encounter (HOSPITAL_COMMUNITY): Payer: Self-pay | Admitting: Gastroenterology

## 2017-07-30 DIAGNOSIS — J45901 Unspecified asthma with (acute) exacerbation: Secondary | ICD-10-CM | POA: Diagnosis not present

## 2017-07-30 DIAGNOSIS — J22 Unspecified acute lower respiratory infection: Secondary | ICD-10-CM | POA: Diagnosis not present

## 2017-08-17 ENCOUNTER — Other Ambulatory Visit: Payer: Self-pay | Admitting: Gastroenterology

## 2017-08-17 ENCOUNTER — Ambulatory Visit
Admission: RE | Admit: 2017-08-17 | Discharge: 2017-08-17 | Disposition: A | Discharge status: Home / Self Care | Payer: Medicare Other | Source: Ambulatory Visit | Attending: Gastroenterology | Admitting: Gastroenterology

## 2017-08-17 DIAGNOSIS — J45901 Unspecified asthma with (acute) exacerbation: Secondary | ICD-10-CM

## 2017-08-17 DIAGNOSIS — J45909 Unspecified asthma, uncomplicated: Secondary | ICD-10-CM | POA: Diagnosis not present

## 2017-08-17 DIAGNOSIS — R932 Abnormal findings on diagnostic imaging of liver and biliary tract: Secondary | ICD-10-CM | POA: Diagnosis not present

## 2017-08-18 ENCOUNTER — Other Ambulatory Visit: Payer: Self-pay | Admitting: Internal Medicine

## 2017-08-18 DIAGNOSIS — I119 Hypertensive heart disease without heart failure: Secondary | ICD-10-CM

## 2017-08-20 NOTE — Telephone Encounter (Signed)
REFILL 

## 2017-11-13 ENCOUNTER — Other Ambulatory Visit: Payer: Self-pay | Admitting: Internal Medicine

## 2017-11-14 ENCOUNTER — Encounter: Payer: Self-pay | Admitting: Internal Medicine

## 2017-11-14 ENCOUNTER — Ambulatory Visit (INDEPENDENT_AMBULATORY_CARE_PROVIDER_SITE_OTHER): Payer: Medicare Other | Admitting: Internal Medicine

## 2017-11-14 VITALS — BP 139/81 | HR 50 | Ht 76.0 in | Wt 239.0 lb

## 2017-11-14 DIAGNOSIS — I493 Ventricular premature depolarization: Secondary | ICD-10-CM

## 2017-11-14 DIAGNOSIS — I119 Hypertensive heart disease without heart failure: Secondary | ICD-10-CM

## 2017-11-14 DIAGNOSIS — I452 Bifascicular block: Secondary | ICD-10-CM | POA: Diagnosis not present

## 2017-11-14 NOTE — Progress Notes (Signed)
OFFICE NOTE  Chief Complaint:  No complaints  Primary Care Physician: Alroy Dust, L.Marlou Sa, MD  HPI:  [name] is a 83 y.o. male who is a former patient of Dr. Mare Ferrari. His wife is also a patient of mine. He has a history of PVCs and is on medicine for suppression of those PVCs and also possible hypertension. Blood pressure initially 155/83 became down to 120/78. He was noted to have a few PVCs today but said he did not take his medicine this morning. Other than that he denies any medical problems. He has a history of multiple orthopedic surgeries and gallbladder surgery in the past. He has a chronic left-sided chest pain which is sharp and he can point to with his finger. This is along the left sternal border and generally does not move, worsened with exertion or is not relieved by rest. He's had 2 heart catheterizations last 5 years, neither study indicated any significant obstructive coronary disease.  11/23/2016  [name] returns today for follow-up. He denies any chest pain or worsening shortness of breath. He does have some PVCs however he is unaware of them. He is on once daily metoprolol tartrate. Blood pressure is at goal today 122/70.  05/31/2017  [name] was seen today in follow-up. He reports 2 episodes of recent chest pain. One episode was associated with stomach upset and pain that radiated up the center of the chest to his neck. He had some associated nausea and diaphoresis. He had a second episode which might of occurred after a spicy meal that was similar although he ended up vomiting after which she felt better. He denied any exercise intolerance, is able to exercise regularly, recently went on a long walking trip vacation and has no symptoms with this activity. He has a history of a false positive stress test in the past to prior cardiac catheterizations in the last 5 years which were negative.  11/14/2017  [name] comes back today for follow-up.  He is  without complaints currently.  He was having atypical chest pain symptoms which I felt more likely GI in etiology.  He ultimately was found to have bile duct stones, although had a history of prior cholecystectomy.  The stones were extracted and he has since improved significantly.  He is in fact off of his PPI medication.  Blood pressures been well controlled.  He is active and denies any chest pain or worsening shortness of breath.  PMHx:  Past Medical History:  Diagnosis Date  . Arthritis   . Asthma    only if develops cold  . Dysrhythmia    h/o pvc's  . GERD (gastroesophageal reflux disease)   . Kidney stone on left side   . PVC (premature ventricular contraction)   . Recurrent upper respiratory infection (URI) 03/2011    Past Surgical History:  Procedure Laterality Date  . APPENDECTOMY    . BACK SURGERY     x 4  . CARDIAC CATHETERIZATION     july 2010-minor irregl  . CERVICAL SPINE SURGERY     x 2  . CHOLECYSTECTOMY     2008  . ERCP N/A 07/24/2017   Procedure: ENDOSCOPIC RETROGRADE CHOLANGIOPANCREATOGRAPHY (ERCP);  Surgeon: Clarene Essex, MD;  Location: [facility];  Service: Endoscopy;  Laterality: N/A;  . EYE SURGERY     bilat cataract  . INCISION AND DRAINAGE HIP  07/18/2011   Procedure: IRRIGATION AND DEBRIDEMENT HIP;  Surgeon: Garald Balding, MD;  Location: [facility]  OR;  Service: Orthopedics;  Laterality: Right;  RIGHT HIP EXPLORATION, EXCISION OF DEEP BURSAL SAC  . KIDNEY STONE SURGERY  2011   R  . KNEE ARTHROSCOPY     Left  . SHOULDER ARTHROSCOPY     Left  . TOTAL HIP ARTHROPLASTY     Right  . TOTAL HIP REVISION     Right  . TOTAL KNEE ARTHROPLASTY Right 03/31/2014   Procedure: TOTAL KNEE ARTHROPLASTY;  Surgeon: Garald Balding, MD;  Location: [place];  Service: Orthopedics;  Laterality: Right;  . TOTAL KNEE ARTHROPLASTY Left 08/01/2016   Procedure: TOTAL KNEE ARTHROPLASTY;  Surgeon: Garald Balding, MD;  Location: [place];  Service: Orthopedics;  Laterality: Left;   . WRIST SURGERY     bilateral, plates    FAMHx:  Family History  Problem Relation Age of Onset  . Heart disease Mother   . Heart attack Mother   . Anesthesia problems Neg Hx   . Hypotension Neg Hx   . Malignant hyperthermia Neg Hx   . Pseudochol deficiency Neg Hx     SOCHx:   reports that  has never smoked. he has never used smokeless tobacco. He reports that he does not drink alcohol or use drugs.  ALLERGIES:  Allergies  Allergen Reactions  . Tape Other (See Comments)    Adhesive tape blisters  . Erythromycin Rash  . Sulfa Antibiotics Rash  . Tetracyclines & Related Rash    ROS: Pertinent items noted in HPI and remainder of comprehensive ROS otherwise negative.  HOME MEDS: Current Outpatient Medications  Medication Sig Dispense Refill  . acetaminophen (TYLENOL) 500 MG tablet Take 1,000 mg every 6 (six) hours as needed by mouth (for pain.).    Marland Kitchen albuterol (PROVENTIL HFA;VENTOLIN HFA) 108 (90 BASE) MCG/ACT inhaler Inhale 2 puffs into the lungs every 6 (six) hours as needed for wheezing or shortness of breath. 1 Inhaler 3  . aspirin EC 81 MG tablet Take 81 mg by mouth daily.    . metoprolol succinate (TOPROL-XL) 25 MG 24 hr tablet TAKE 1 TABLET ONCE DAILY. 90 tablet 1  . potassium chloride SA (K-DUR,KLOR-CON) 20 MEQ tablet TAKE 1 TABLET ONCE DAILY. 90 tablet 3  . Ascorbic Acid (VITAMIN C) 500 MG tablet Take 1 tablet (500 mg total) by mouth daily. 30 tablet   . potassium chloride SA (K-DUR,KLOR-CON) 20 MEQ tablet TAKE 1 TABLET ONCE DAILY. 90 tablet 2   No current facility-administered medications for this visit.     LABS/IMAGING: No results found for this or any previous visit (from the past 48 hour(s)). No results found.  WEIGHTS: Wt Readings from Last 3 Encounters:  11/14/17 239 lb (108.4 kg)  07/24/17 230 lb (104.3 kg)  06/12/17 234 lb (106.1 kg)    VITALS: BP 139/81   Pulse (!) 50   Ht 6\' 4"  (1.93 m)   Wt 239 lb (108.4 kg)   BMI 29.09 kg/m    EXAM: General appearance: alert and no distress Neck: no carotid bruit and no JVD Lungs: clear to auscultation bilaterally Heart: regular rate and rhythm, S1, S2 normal, no murmur, click, rub or gallop Abdomen: soft, non-tender; bowel sounds normal; no masses,  no organomegaly Extremities: extremities normal, atraumatic, no cyanosis or edema Pulses: 2+ and symmetric Skin: Skin color, texture, turgor normal. No rashes or lesions Neurologic: Grossly normal Psych: Pleasant  EKG: Sinus bradycardia first-degree AV block at 50, RBBB-personally reviewed  ASSESSMENT: 1. Recent bile duct stone extraction 2. PVCs -  symptomatic 3. RBBB 4. Hypertension  PLAN: 1.   Mr. Posthumus has had resolution of his chest/abdominal pain which is related to bile duct stones.  He is now doing much better.  He denies any recurrent PVCs.  His blood pressure is well controlled.  No changes to his medicines today.  Follow-up in 6 months or sooner as necessary.  Pixie Casino, MD, [facility], [place] Director of the [facility]  [facility] Diplomate of the American Board of Clinical Lipidology Attending Cardiologist  Direct Dial: [phone]  Fax: [phone]  Website:  [email or web address] 11/14/2017, 9:05 AM

## 2017-11-14 NOTE — Patient Instructions (Signed)
Your physician wants you to follow-up in: 6 months with Dr. Hilty. You will receive a reminder letter in the mail two months in advance. If you don't receive a letter, please call our office to schedule the follow-up appointment.    

## 2017-12-27 ENCOUNTER — Ambulatory Visit (INDEPENDENT_AMBULATORY_CARE_PROVIDER_SITE_OTHER): Payer: Medicare Other | Admitting: Orthopaedic Surgery

## 2017-12-27 ENCOUNTER — Ambulatory Visit (INDEPENDENT_AMBULATORY_CARE_PROVIDER_SITE_OTHER): Payer: Medicare Other

## 2017-12-27 ENCOUNTER — Encounter (INDEPENDENT_AMBULATORY_CARE_PROVIDER_SITE_OTHER): Payer: Self-pay | Admitting: Orthopaedic Surgery

## 2017-12-27 VITALS — BP 153/59 | HR 35 | Resp 16 | Ht 76.0 in | Wt 234.0 lb

## 2017-12-27 DIAGNOSIS — M25511 Pain in right shoulder: Secondary | ICD-10-CM | POA: Diagnosis not present

## 2017-12-27 MED ORDER — BUPIVACAINE HCL 0.5 % IJ SOLN
2.0000 mL | INTRAMUSCULAR | Status: AC | PRN
  Administered 2017-12-27: 2 mL via INTRA_ARTICULAR

## 2017-12-27 MED ORDER — LIDOCAINE HCL 2 % IJ SOLN
2.0000 mL | INTRAMUSCULAR | Status: AC | PRN
  Administered 2017-12-27: 2 mL

## 2017-12-27 MED ORDER — DICLOFENAC SODIUM 1 % TD GEL: 4.0000 g | Freq: Three times a day (TID) | TRANSDERMAL | 3 refills | Status: DC | PRN

## 2017-12-27 MED ORDER — METHYLPREDNISOLONE ACETATE 40 MG/ML IJ SUSP
80.0000 mg | INTRAMUSCULAR | Status: AC | PRN
  Administered 2017-12-27: 80 mg

## 2017-12-27 NOTE — Progress Notes (Signed)
Office Visit Note   Patient: [name]           Date of Birth: Jul 04, 1935           MRN: [id number] Visit Date: 12/27/2017              Requested by: Alroy Dust, L.Marlou Sa, [place] [employer] [address] [place], [place] [address] PCP: Alroy Dust, L.Marlou Sa, MD   Assessment & Plan: Visit Diagnoses:  1. Acute pain of right shoulder     Plan: Impingement syndrome right shoulder.  We will try a subacromial cortisone injection and monitor the response.  Could have a rotator cuff tear.  Consider MRI scan if no improvement  Follow-Up Instructions: No follow-ups on file.   Orders:  Orders Placed This Encounter  Procedures  . XR Shoulder Right   No orders of the defined types were placed in this encounter.     Procedures: Large Joint Inj: R subacromial bursa on 12/27/2017 1:56 PM Indications: pain and diagnostic evaluation Details: 25 G 1.5 in needle, anterolateral approach  Arthrogram: No  Medications: 2 mL lidocaine 2 %; 2 mL bupivacaine 0.5 %; 80 mg methylPREDNISolone acetate 40 MG/ML Consent was given by the patient. Immediately prior to procedure a time out was called to verify the correct patient, procedure, equipment, support staff and site/side marked as required. Patient was prepped and draped in the usual sterile fashion.       Clinical Data: No additional findings.   Subjective: Chief Complaint  Patient presents with  . Right Shoulder - Pain  . New Patient (Initial Visit)    r shoulder pain for 1 week no injury, used ibproupin, ice and voltaren gel, no better  [name] noted with acute onset of pain approximately a week ago after pulling the string on a weedeater.  Since that time is been having pain along the lateral subacromial region particularly with overhead activity.  He has had some trouble sleeping.  He has not noticed any numbness or tingling.  Denies any neck problems at present.  HPI  Review of Systems  Constitutional: Negative for fatigue and fever.    HENT: Negative for ear pain.   Eyes: Negative for pain.  Respiratory: Negative for cough and shortness of breath.   Cardiovascular: Negative for leg swelling.  Gastrointestinal: Negative for constipation and diarrhea.  Genitourinary: Negative for difficulty urinating.  Musculoskeletal: Positive for neck pain. Negative for back pain.  Skin: Negative for rash.  Allergic/Immunologic: Negative for food allergies.  Neurological: Positive for weakness and numbness. Negative for headaches.  Hematological: Bruises/bleeds easily.  Psychiatric/Behavioral: Positive for sleep disturbance.     Objective: Vital Signs: BP (!) 153/59 (BP Location: Left Arm, Patient Position: Sitting, Cuff Size: Normal)   Pulse (!) 35   Resp 16   Ht 6\' 4"  (1.93 m)   Wt 234 lb (106.1 kg)   BMI 28.48 kg/m   Physical Exam  Constitutional: He is oriented to person, place, and time. He appears well-developed and well-nourished.  HENT:  Head: Normocephalic.  Eyes: Pupils are equal, round, and reactive to light. EOM are normal.  Pulmonary/Chest: Effort normal.  Neurological: He is alert and oriented to person, place, and time.  Skin: Skin is warm and dry.  Psychiatric: He has a normal mood and affect. His behavior is normal.    Ortho Exam awake alert and oriented x3.  Comfortable sitting.  Positive impingement and positive empty can testing.  Biceps intact.  Skin intact.  Good grip  and good release.  Able to place his arm over his head with a circuitous motion.  No evidence of adhesive capsulitis.  No neck pain.  No localized areas of tenderness with his arm at his side.  Specialty Comments:  No specialty comments available.  Imaging: No results found.   PMFS History: Patient Active Problem List   Diagnosis Date Noted  . Gastroesophageal reflux disease 05/31/2017  . S/P total knee replacement using cement, left 08/01/2016  . RBBB (right bundle branch block with left anterior fascicular block) 04/12/2016   . PVC's (premature ventricular contractions) 04/12/2016  . Chest wall pain 04/12/2016  . Bronchial asthma 06/29/2014  . Urinary retention due to benign prostatic hyperplasia 04/03/2014  . Ileus, postoperative ([place]) 04/03/2014  . Osteoarthritis of right knee 04/01/2014  . S/P total knee replacement using cement 03/31/2014  . Skin rash 04/11/2013  . Pseudogout of knee 12/12/2012  . Dyspepsia 08/14/2012  . Trochanteric Bursitis of right hip 07/18/2011  . Benign hypertensive heart disease without heart failure 12/13/2010  . BPH (benign prostatic hyperplasia) 12/13/2010  . Unilateral primary osteoarthritis, left knee 12/13/2010  . Dyslipidemia 12/13/2010  . Status post cholecystectomy 12/13/2010   Past Medical History:  Diagnosis Date  . Arthritis   . Asthma    only if develops cold  . Dysrhythmia    h/o pvc's  . GERD (gastroesophageal reflux disease)   . Kidney stone on left side   . PVC (premature ventricular contraction)   . Recurrent upper respiratory infection (URI) 03/2011    Family History  Problem Relation Age of Onset  . Heart disease Mother   . Heart attack Mother   . Anesthesia problems Neg Hx   . Hypotension Neg Hx   . Malignant hyperthermia Neg Hx   . Pseudochol deficiency Neg Hx     Past Surgical History:  Procedure Laterality Date  . APPENDECTOMY    . BACK SURGERY     x 4  . CARDIAC CATHETERIZATION     july 2010-minor irregl  . CERVICAL SPINE SURGERY     x 2  . CHOLECYSTECTOMY     2008  . ERCP N/A 07/24/2017   Procedure: ENDOSCOPIC RETROGRADE CHOLANGIOPANCREATOGRAPHY (ERCP);  Surgeon: Clarene Essex, MD;  Location: [facility];  Service: Endoscopy;  Laterality: N/A;  . EYE SURGERY     bilat cataract  . INCISION AND DRAINAGE HIP  07/18/2011   Procedure: IRRIGATION AND DEBRIDEMENT HIP;  Surgeon: Garald Balding, MD;  Location: [place];  Service: Orthopedics;  Laterality: Right;  RIGHT HIP EXPLORATION, EXCISION OF DEEP BURSAL SAC  . KIDNEY STONE SURGERY   2011   R  . KNEE ARTHROSCOPY     Left  . SHOULDER ARTHROSCOPY     Left  . TOTAL HIP ARTHROPLASTY     Right  . TOTAL HIP REVISION     Right  . TOTAL KNEE ARTHROPLASTY Right 03/31/2014   Procedure: TOTAL KNEE ARTHROPLASTY;  Surgeon: Garald Balding, MD;  Location: [place];  Service: Orthopedics;  Laterality: Right;  . TOTAL KNEE ARTHROPLASTY Left 08/01/2016   Procedure: TOTAL KNEE ARTHROPLASTY;  Surgeon: Garald Balding, MD;  Location: [place];  Service: Orthopedics;  Laterality: Left;  . WRIST SURGERY     bilateral, plates   Social History   Occupational History  . Not on file  Tobacco Use  . Smoking status: Never Smoker  . Smokeless tobacco: Never Used  Substance and Sexual Activity  . Alcohol use: No  Alcohol/week: 0.0 oz  . Drug use: No  . Sexual activity: Not on file

## 2018-02-14 DIAGNOSIS — H0100A Unspecified blepharitis right eye, upper and lower eyelids: Secondary | ICD-10-CM | POA: Diagnosis not present

## 2018-02-14 DIAGNOSIS — H43813 Vitreous degeneration, bilateral: Secondary | ICD-10-CM | POA: Diagnosis not present

## 2018-02-14 DIAGNOSIS — H04123 Dry eye syndrome of bilateral lacrimal glands: Secondary | ICD-10-CM | POA: Diagnosis not present

## 2018-02-14 DIAGNOSIS — H52203 Unspecified astigmatism, bilateral: Secondary | ICD-10-CM | POA: Diagnosis not present

## 2018-02-18 ENCOUNTER — Other Ambulatory Visit: Payer: Self-pay | Admitting: Internal Medicine

## 2018-02-18 DIAGNOSIS — I119 Hypertensive heart disease without heart failure: Secondary | ICD-10-CM

## 2018-05-20 ENCOUNTER — Encounter: Payer: Self-pay | Admitting: Internal Medicine

## 2018-05-20 ENCOUNTER — Ambulatory Visit (INDEPENDENT_AMBULATORY_CARE_PROVIDER_SITE_OTHER): Payer: Medicare Other | Admitting: Internal Medicine

## 2018-05-20 VITALS — BP 130/64 | HR 67 | Ht 76.0 in | Wt 235.8 lb

## 2018-05-20 DIAGNOSIS — I493 Ventricular premature depolarization: Secondary | ICD-10-CM

## 2018-05-20 DIAGNOSIS — I452 Bifascicular block: Secondary | ICD-10-CM | POA: Diagnosis not present

## 2018-05-20 DIAGNOSIS — I119 Hypertensive heart disease without heart failure: Secondary | ICD-10-CM | POA: Diagnosis not present

## 2018-05-20 NOTE — Patient Instructions (Signed)
Your physician wants you to follow-up in: [name]. You will receive a reminder email via [place] [facility] when you are due for your next appointment. Please call our office to schedule an appointment once you receive this email.

## 2018-05-20 NOTE — Progress Notes (Signed)
OFFICE NOTE  Chief Complaint:  No complaints  Primary Care Physician: Alroy Dust, L.Marlou Sa, MD  HPI:  [name] is a 83 y.o. male who is a former patient of Dr. Mare Ferrari. His wife is also a patient of mine. He has a history of PVCs and is on medicine for suppression of those PVCs and also possible hypertension. Blood pressure initially 155/83 became down to 120/78. He was noted to have a few PVCs today but said he did not take his medicine this morning. Other than that he denies any medical problems. He has a history of multiple orthopedic surgeries and gallbladder surgery in the past. He has a chronic left-sided chest pain which is sharp and he can point to with his finger. This is along the left sternal border and generally does not move, worsened with exertion or is not relieved by rest. He's had 2 heart catheterizations last 5 years, neither study indicated any significant obstructive coronary disease.  11/23/2016  [name] returns today for follow-up. He denies any chest pain or worsening shortness of breath. He does have some PVCs however he is unaware of them. He is on once daily metoprolol tartrate. Blood pressure is at goal today 122/70.  05/31/2017  [name] was seen today in follow-up. He reports 2 episodes of recent chest pain. One episode was associated with stomach upset and pain that radiated up the center of the chest to his neck. He had some associated nausea and diaphoresis. He had a second episode which might of occurred after a spicy meal that was similar although he ended up vomiting after which she felt better. He denied any exercise intolerance, is able to exercise regularly, recently went on a long walking trip vacation and has no symptoms with this activity. He has a history of a false positive stress test in the past to prior cardiac catheterizations in the last 5 years which were negative.  11/14/2017  [name] comes back today for follow-up.  He is  without complaints currently.  He was having atypical chest pain symptoms which I felt more likely GI in etiology.  He ultimately was found to have bile duct stones, although had a history of prior cholecystectomy.  The stones were extracted and he has since improved significantly.  He is in fact off of his PPI medication.  Blood pressures been well controlled.  He is active and denies any chest pain or worsening shortness of breath.  05/20/2018  [name] seen today in follow-up with his wife.  He continues to be without complaints.  Since he had a bile duct stones removed he is had no further chest discomfort.  He denies any shortness of breath or chest pain.  Blood pressure is at goal.  PMHx:  Past Medical History:  Diagnosis Date  . Arthritis   . Asthma    only if develops cold  . Dysrhythmia    h/o pvc's  . GERD (gastroesophageal reflux disease)   . Kidney stone on left side   . PVC (premature ventricular contraction)   . Recurrent upper respiratory infection (URI) 03/2011    Past Surgical History:  Procedure Laterality Date  . APPENDECTOMY    . BACK SURGERY     x 4  . CARDIAC CATHETERIZATION     july 2010-minor irregl  . CERVICAL SPINE SURGERY     x 2  . CHOLECYSTECTOMY     2008  . ERCP N/A 07/24/2017   Procedure: ENDOSCOPIC RETROGRADE  CHOLANGIOPANCREATOGRAPHY (ERCP);  Surgeon: Clarene Essex, MD;  Location: [facility];  Service: Endoscopy;  Laterality: N/A;  . EYE SURGERY     bilat cataract  . INCISION AND DRAINAGE HIP  07/18/2011   Procedure: IRRIGATION AND DEBRIDEMENT HIP;  Surgeon: Garald Balding, MD;  Location: [place];  Service: Orthopedics;  Laterality: Right;  RIGHT HIP EXPLORATION, EXCISION OF DEEP BURSAL SAC  . KIDNEY STONE SURGERY  2011   R  . KNEE ARTHROSCOPY     Left  . SHOULDER ARTHROSCOPY     Left  . TOTAL HIP ARTHROPLASTY     Right  . TOTAL HIP REVISION     Right  . TOTAL KNEE ARTHROPLASTY Right 03/31/2014   Procedure: TOTAL KNEE ARTHROPLASTY;   Surgeon: Garald Balding, MD;  Location: [place];  Service: Orthopedics;  Laterality: Right;  . TOTAL KNEE ARTHROPLASTY Left 08/01/2016   Procedure: TOTAL KNEE ARTHROPLASTY;  Surgeon: Garald Balding, MD;  Location: [facility];  Service: Orthopedics;  Laterality: Left;  . WRIST SURGERY     bilateral, plates    FAMHx:  Family History  Problem Relation Age of Onset  . Heart disease Mother   . Heart attack Mother   . Anesthesia problems Neg Hx   . Hypotension Neg Hx   . Malignant hyperthermia Neg Hx   . Pseudochol deficiency Neg Hx     SOCHx:   reports that he has never smoked. He has never used smokeless tobacco. He reports that he does not drink alcohol or use drugs.  ALLERGIES:  Allergies  Allergen Reactions  . Tape Other (See Comments)    Adhesive tape blisters  . Erythromycin Rash  . Sulfa Antibiotics Rash  . Tetracyclines & Related Rash    ROS: Pertinent items noted in HPI and remainder of comprehensive ROS otherwise negative.  HOME MEDS: Current Outpatient Medications  Medication Sig Dispense Refill  . acetaminophen (TYLENOL) 500 MG tablet Take 1,000 mg every 6 (six) hours as needed by mouth (for pain.).    Marland Kitchen albuterol (PROVENTIL HFA;VENTOLIN HFA) 108 (90 BASE) MCG/ACT inhaler Inhale 2 puffs into the lungs every 6 (six) hours as needed for wheezing or shortness of breath. 1 Inhaler 3  . Ascorbic Acid (VITAMIN C) 500 MG tablet Take 1 tablet (500 mg total) by mouth daily. 30 tablet   . aspirin EC 81 MG tablet Take 81 mg by mouth daily.    . diclofenac sodium (VOLTAREN) 1 % GEL Apply 4 g topically 3 (three) times daily as needed (Apply to large joint up to 3 times daily as needed). 3 Tube 3  . metoprolol succinate (TOPROL-XL) 25 MG 24 hr tablet TAKE 1 TABLET ONCE DAILY. 90 tablet 2  . potassium chloride SA (K-DUR,KLOR-CON) 20 MEQ tablet TAKE 1 TABLET ONCE DAILY. 90 tablet 2   No current facility-administered medications for this visit.     LABS/IMAGING: No results  found for this or any previous visit (from the past 48 hour(s)). No results found.  WEIGHTS: Wt Readings from Last 3 Encounters:  05/20/18 235 lb 12.8 oz (107 kg)  12/27/17 234 lb (106.1 kg)  11/14/17 239 lb (108.4 kg)    VITALS: BP 130/64 (BP Location: Left Arm, Patient Position: Sitting, Cuff Size: Normal)   Pulse 67   Ht 6\' 4"  (1.93 m)   Wt 235 lb 12.8 oz (107 kg)   BMI 28.70 kg/m   EXAM: General appearance: alert and no distress Neck: no carotid bruit and no JVD  Lungs: clear to auscultation bilaterally Heart: regular rate and rhythm, S1, S2 normal, no murmur, click, rub or gallop Abdomen: soft, non-tender; bowel sounds normal; no masses,  no organomegaly Extremities: extremities normal, atraumatic, no cyanosis or edema Pulses: 2+ and symmetric Skin: Skin color, texture, turgor normal. No rashes or lesions Neurologic: Grossly normal Psych: Pleasant  EKG: Since normal sinus rhythm, RBBB at 67-personally reviewed  ASSESSMENT: 1. Recent bile duct stone extraction 2. PVCs - symptomatic 3. RBBB 4. Hypertension  PLAN: 1.   [name] continues to do well without any new symptoms of chest pain or worsening shortness of breath.  He reports very infrequent PVCs.  His RBBB is stable.  Blood pressure is at goal.  No changes to his medicines today.  Follow-up in 6 months or sooner as necessary.  Pixie Casino, MD, [facility], [place] Director of the Advanced Lipid Disorders &  Cardiovascular Risk Reduction Clinic Diplomate of the American Board of Clinical Lipidology Attending Cardiologist  Direct Dial: [phone]  Fax: [phone]  Website:  [email or web address] 05/20/2018, 3:06 PM

## 2018-05-21 ENCOUNTER — Encounter: Payer: Self-pay | Admitting: Internal Medicine

## 2018-05-21 DIAGNOSIS — Z0000 Encounter for general adult medical examination without abnormal findings: Secondary | ICD-10-CM | POA: Diagnosis not present

## 2018-05-21 DIAGNOSIS — R002 Palpitations: Secondary | ICD-10-CM | POA: Diagnosis not present

## 2018-05-21 DIAGNOSIS — Z23 Encounter for immunization: Secondary | ICD-10-CM | POA: Diagnosis not present

## 2018-08-16 ENCOUNTER — Ambulatory Visit (INDEPENDENT_AMBULATORY_CARE_PROVIDER_SITE_OTHER): Payer: Medicare Other | Admitting: Internal Medicine

## 2018-08-16 ENCOUNTER — Encounter: Payer: Self-pay | Admitting: Internal Medicine

## 2018-08-16 VITALS — BP 112/76 | HR 68 | Ht 76.0 in | Wt 237.0 lb

## 2018-08-16 DIAGNOSIS — R0600 Dyspnea, unspecified: Secondary | ICD-10-CM | POA: Diagnosis not present

## 2018-08-16 DIAGNOSIS — I452 Bifascicular block: Secondary | ICD-10-CM | POA: Diagnosis not present

## 2018-08-16 DIAGNOSIS — I499 Cardiac arrhythmia, unspecified: Secondary | ICD-10-CM | POA: Diagnosis not present

## 2018-08-16 DIAGNOSIS — I493 Ventricular premature depolarization: Secondary | ICD-10-CM

## 2018-08-16 DIAGNOSIS — I498 Other specified cardiac arrhythmias: Secondary | ICD-10-CM

## 2018-08-16 NOTE — Progress Notes (Signed)
OFFICE NOTE  Chief Complaint:  No complaints  Primary Care Physician: Alroy Dust, L.Marlou Sa, MD  HPI:  [name] is a 83 y.o. male who is a former patient of Dr. Mare Ferrari. His wife is also a patient of mine. He has a history of PVCs and is on medicine for suppression of those PVCs and also possible hypertension. Blood pressure initially 155/83 became down to 120/78. He was noted to have a few PVCs today but said he did not take his medicine this morning. Other than that he denies any medical problems. He has a history of multiple orthopedic surgeries and gallbladder surgery in the past. He has a chronic left-sided chest pain which is sharp and he can point to with his finger. This is along the left sternal border and generally does not move, worsened with exertion or is not relieved by rest. He's had 2 heart catheterizations last 5 years, neither study indicated any significant obstructive coronary disease.  11/23/2016  [name] returns today for follow-up. He denies any chest pain or worsening shortness of breath. He does have some PVCs however he is unaware of them. He is on once daily metoprolol tartrate. Blood pressure is at goal today 122/70.  05/31/2017  [name] was seen today in follow-up. He reports 2 episodes of recent chest pain. One episode was associated with stomach upset and pain that radiated up the center of the chest to his neck. He had some associated nausea and diaphoresis. He had a second episode which might of occurred after a spicy meal that was similar although he ended up vomiting after which she felt better. He denied any exercise intolerance, is able to exercise regularly, recently went on a long walking trip vacation and has no symptoms with this activity. He has a history of a false positive stress test in the past to prior cardiac catheterizations in the last 5 years which were negative.  11/14/2017  [name] comes back today for follow-up.  He is  without complaints currently.  He was having atypical chest pain symptoms which I felt more likely GI in etiology.  He ultimately was found to have bile duct stones, although had a history of prior cholecystectomy.  The stones were extracted and he has since improved significantly.  He is in fact off of his PPI medication.  Blood pressures been well controlled.  He is active and denies any chest pain or worsening shortness of breath.  05/20/2018  [name] seen today in follow-up with his wife.  He continues to be without complaints.  Since he had a bile duct stones removed he is had no further chest discomfort.  He denies any shortness of breath or chest pain.  Blood pressure is at goal.  08/16/2018  [name] returns today for follow-up.  He has had some persistent palpitations.  EKG today shows PVCs in a bigeminal pattern.  Blood pressure is well controlled on metoprolol.  He also reports some shortness of breath, making me concerned a little about of whether he has developed any cardiomyopathy related to his PVCs.  The exact burden is not clear.  PMHx:  Past Medical History:  Diagnosis Date  . Arthritis   . Asthma    only if develops cold  . Dysrhythmia    h/o pvc's  . GERD (gastroesophageal reflux disease)   . Kidney stone on left side   . PVC (premature ventricular contraction)   . Recurrent upper respiratory infection (URI) 03/2011  Past Surgical History:  Procedure Laterality Date  . APPENDECTOMY    . BACK SURGERY     x 4  . CARDIAC CATHETERIZATION     july 2010-minor irregl  . CERVICAL SPINE SURGERY     x 2  . CHOLECYSTECTOMY     2008  . ERCP N/A 07/24/2017   Procedure: ENDOSCOPIC RETROGRADE CHOLANGIOPANCREATOGRAPHY (ERCP);  Surgeon: Clarene Essex, MD;  Location: [facility];  Service: Endoscopy;  Laterality: N/A;  . EYE SURGERY     bilat cataract  . INCISION AND DRAINAGE HIP  07/18/2011   Procedure: IRRIGATION AND DEBRIDEMENT HIP;  Surgeon: Garald Balding, MD;   Location: [place];  Service: Orthopedics;  Laterality: Right;  RIGHT HIP EXPLORATION, EXCISION OF DEEP BURSAL SAC  . KIDNEY STONE SURGERY  2011   R  . KNEE ARTHROSCOPY     Left  . SHOULDER ARTHROSCOPY     Left  . TOTAL HIP ARTHROPLASTY     Right  . TOTAL HIP REVISION     Right  . TOTAL KNEE ARTHROPLASTY Right 03/31/2014   Procedure: TOTAL KNEE ARTHROPLASTY;  Surgeon: Garald Balding, MD;  Location: [place];  Service: Orthopedics;  Laterality: Right;  . TOTAL KNEE ARTHROPLASTY Left 08/01/2016   Procedure: TOTAL KNEE ARTHROPLASTY;  Surgeon: Garald Balding, MD;  Location: [place];  Service: Orthopedics;  Laterality: Left;  . WRIST SURGERY     bilateral, plates    FAMHx:  Family History  Problem Relation Age of Onset  . Heart disease Mother   . Heart attack Mother   . Anesthesia problems Neg Hx   . Hypotension Neg Hx   . Malignant hyperthermia Neg Hx   . Pseudochol deficiency Neg Hx     SOCHx:   reports that he has never smoked. He has never used smokeless tobacco. He reports that he does not drink alcohol or use drugs.  ALLERGIES:  Allergies  Allergen Reactions  . Tape Other (See Comments)    Adhesive tape blisters  . Erythromycin Rash  . Sulfa Antibiotics Rash  . Tetracyclines & Related Rash    ROS: Pertinent items noted in HPI and remainder of comprehensive ROS otherwise negative.  HOME MEDS: Current Outpatient Medications  Medication Sig Dispense Refill  . acetaminophen (TYLENOL) 500 MG tablet Take 1,000 mg every 6 (six) hours as needed by mouth (for pain.).    Marland Kitchen albuterol (PROVENTIL HFA;VENTOLIN HFA) 108 (90 BASE) MCG/ACT inhaler Inhale 2 puffs into the lungs every 6 (six) hours as needed for wheezing or shortness of breath. 1 Inhaler 3  . Ascorbic Acid (VITAMIN C) 500 MG tablet Take 1 tablet (500 mg total) by mouth daily. 30 tablet   . diclofenac sodium (VOLTAREN) 1 % GEL Apply 4 g topically 3 (three) times daily as needed (Apply to large joint up to 3 times daily  as needed). 3 Tube 3  . metoprolol succinate (TOPROL-XL) 25 MG 24 hr tablet TAKE 1 TABLET ONCE DAILY. 90 tablet 2  . potassium chloride SA (K-DUR,KLOR-CON) 20 MEQ tablet TAKE 1 TABLET ONCE DAILY. 90 tablet 2   No current facility-administered medications for this visit.     LABS/IMAGING: No results found for this or any previous visit (from the past 48 hour(s)). No results found.  WEIGHTS: Wt Readings from Last 3 Encounters:  08/16/18 237 lb (107.5 kg)  05/20/18 235 lb 12.8 oz (107 kg)  12/27/17 234 lb (106.1 kg)    VITALS: BP 112/76   Pulse 68  Ht 6\' 4"  (1.93 m)   Wt 237 lb (107.5 kg)   BMI 28.85 kg/m   EXAM: General appearance: alert and no distress Neck: no carotid bruit and no JVD Lungs: clear to auscultation bilaterally Heart: regular rate and rhythm, S1, S2 normal, no murmur, click, rub or gallop Abdomen: soft, non-tender; bowel sounds normal; no masses,  no organomegaly Extremities: extremities normal, atraumatic, no cyanosis or edema Pulses: 2+ and symmetric Skin: Skin color, texture, turgor normal. No rashes or lesions Neurologic: Grossly normal Psych: Pleasant  EKG: Sinus rhythm with frequent PVCs and bigeminy at 68, RBBB-personally reviewed  ASSESSMENT: 1. Symptomatic bigeminal PVCs 2. Shortness of breath 3. RBBB 4. Hypertension  PLAN: 1.   [name] is noted to have more frequent PVCs today on exam and EKG.  He has had some shortness of breath and a right bundle branch block.  I would like to obtain an echocardiogram to better understand he is LV function.  In addition we will place a 48-hour monitor to see his burden of PVCs.  Because his blood pressure is normal to low normal on beta-blocker he may need antiarrhythmic therapy or EP evaluation if he has a high burden of PVCs.  Follow-up with me afterwards.  Pixie Casino, MD, [facility], [place] Director of the Advanced Lipid Disorders &  Cardiovascular Risk  Reduction Clinic Diplomate of the American Board of Clinical Lipidology Attending Cardiologist  Direct Dial: [phone]  Fax: [phone]  Website:  [email or web address] 08/16/2018, 11:13 AM

## 2018-08-16 NOTE — Patient Instructions (Signed)
Medication Instructions:  Continue current medications If you need a refill on your cardiac medications before your next appointment, please call your pharmacy.   Lab work: NONE If you have labs (blood work) drawn today and your tests are completely normal, you will receive your results only by: Marland Kitchen MyChart Message (if you have MyChart) OR . A paper copy in the mail If you have any lab test that is abnormal or we need to change your treatment, we will call you to review the results.  Testing/Procedures: Your physician has requested that you have an echocardiogram. Echocardiography is a painless test that uses sound waves to create images of your heart. It provides your doctor with information about the size and shape of your heart and how well your heart's chambers and valves are working. This procedure takes approximately one hour. There are no restrictions for this procedure.  Your physician has recommended that you wear a holter monitor for 48 hours. Holter monitors are medical devices that record the heart's electrical activity. Doctors most often use these monitors to diagnose arrhythmias. Arrhythmias are problems with the speed or rhythm of the heartbeat. The monitor is a small, portable device. You can wear one while you do your normal daily activities.   Both appointments are @ [address] - [address] Floor  Follow-Up: At [facility], you and your health needs are our priority.  As part of our continuing mission to provide you with exceptional heart care, we have created designated Provider Care Teams.  These Care Teams include your primary Cardiologist (physician) and Advanced Practice Providers (APPs -  Physician Assistants and Nurse Practitioners) who all work together to provide you with the care you need, when you need it. You will need a follow up appointment in after your echo & holter. You may see Pixie Casino, MD or one of the following Advanced Practice Providers on your  designated Care Team: [place], [place] . Fabian Sharp, PA-C  Any Other Special Instructions Will Be Listed Below (If Applicable).

## 2018-08-26 ENCOUNTER — Ambulatory Visit (INDEPENDENT_AMBULATORY_CARE_PROVIDER_SITE_OTHER): Payer: Medicare Other

## 2018-08-26 ENCOUNTER — Other Ambulatory Visit: Payer: Self-pay

## 2018-08-26 ENCOUNTER — Ambulatory Visit (HOSPITAL_COMMUNITY): Payer: Medicare Other | Attending: Cardiovascular Disease

## 2018-08-26 DIAGNOSIS — I498 Other specified cardiac arrhythmias: Secondary | ICD-10-CM

## 2018-08-26 DIAGNOSIS — I493 Ventricular premature depolarization: Secondary | ICD-10-CM | POA: Diagnosis not present

## 2018-08-26 DIAGNOSIS — I499 Cardiac arrhythmia, unspecified: Secondary | ICD-10-CM | POA: Diagnosis not present

## 2018-08-26 MED ORDER — PERFLUTREN LIPID MICROSPHERE
1.0000 mL | INTRAVENOUS | Status: AC | PRN
  Administered 2018-08-26: 2 mL via INTRAVENOUS

## 2018-08-30 ENCOUNTER — Other Ambulatory Visit: Payer: Self-pay | Admitting: *Deleted

## 2018-08-30 DIAGNOSIS — I429 Cardiomyopathy, unspecified: Secondary | ICD-10-CM

## 2018-08-30 DIAGNOSIS — I493 Ventricular premature depolarization: Secondary | ICD-10-CM

## 2018-09-12 ENCOUNTER — Encounter: Payer: Self-pay | Admitting: *Deleted

## 2018-09-17 ENCOUNTER — Ambulatory Visit: Payer: Medicare Other | Admitting: Internal Medicine

## 2018-09-24 ENCOUNTER — Ambulatory Visit (INDEPENDENT_AMBULATORY_CARE_PROVIDER_SITE_OTHER): Payer: Medicare Other | Admitting: Cardiology

## 2018-09-24 ENCOUNTER — Encounter: Payer: Self-pay | Admitting: Cardiology

## 2018-09-24 VITALS — BP 118/60 | HR 69 | Ht 76.0 in | Wt 237.0 lb

## 2018-09-24 DIAGNOSIS — I428 Other cardiomyopathies: Secondary | ICD-10-CM | POA: Diagnosis not present

## 2018-09-24 DIAGNOSIS — I493 Ventricular premature depolarization: Secondary | ICD-10-CM | POA: Diagnosis not present

## 2018-09-24 MED ORDER — FLECAINIDE ACETATE 100 MG PO TABS: 100.0000 mg | ORAL_TABLET | Freq: Two times a day (BID) | ORAL | 3 refills | Status: DC

## 2018-09-24 NOTE — Patient Instructions (Signed)
Medication Instructions:  Your physician has recommended you make the following change in your medication:  1. START Flecainide 100 mg twice a day - you will start this medication 7-10 days prior to stress testing  * If you need a refill on your cardiac medications before your next appointment, please call your pharmacy.   Labwork: None ordered  Testing/Procedures: Your physician has requested that you have an exercise tolerance test. For further information please visit [email or web address]. Please also follow instruction sheet, as given.   Follow-Up: Your physician recommends that you schedule a follow-up appointment in: 3 months with Dr. Curt Bears.  *Please note that any paperwork needing to be filled out by the provider will need to be addressed at the front desk prior to seeing the provider. Please note that any FMLA, disability or other documents regarding health condition is subject to a $25.00 charge that must be received prior to completion of paperwork in the form of a money order or check.  Thank you for choosing [facility]!!   Trinidad Curet, RN [phone]  Any Other Special Instructions Will Be Listed Below (If Applicable).  Flecainide tablets What is this medicine? FLECAINIDE (FLEK a nide) is an antiarrhythmic drug. This medicine is used to prevent irregular heart rhythm. It can also slow down fast heartbeats called tachycardia. This medicine may be used for other purposes; ask your health care provider or pharmacist if you have questions. COMMON BRAND NAME(S): Tambocor What should I tell my health care provider before I take this medicine? They need to know if you have any of these conditions: -abnormal levels of potassium in the blood -heart disease including heart rhythm and heart rate problems -kidney or liver disease -recent heart attack -an unusual or allergic reaction to flecainide, local anesthetics, other medicines, foods, dyes, or  preservatives -pregnant or trying to get pregnant -breast-feeding How should I use this medicine? Take this medicine by mouth with a glass of water. Follow the directions on the prescription label. You can take this medicine with or without food. Take your doses at regular intervals. Do not take your medicine more often than directed. Do not stop taking this medicine suddenly. This may cause serious, heart-related side effects. If your doctor wants you to stop the medicine, the dose may be slowly lowered over time to avoid any side effects. Talk to your pediatrician regarding the use of this medicine in children. While this drug may be prescribed for children as young as 1 year of age for selected conditions, precautions do apply. Overdosage: If you think you have taken too much of this medicine contact a poison control center or emergency room at once. NOTE: This medicine is only for you. Do not share this medicine with others. What if I miss a dose? If you miss a dose, take it as soon as you can. If it is almost time for your next dose, take only that dose. Do not take double or extra doses. What may interact with this medicine? Do not take this medicine with any of the following medications: -amoxapine -arsenic trioxide -certain antibiotics like clarithromycin, erythromycin, gatifloxacin, gemifloxacin, levofloxacin, moxifloxacin, sparfloxacin, or troleandomycin -certain antidepressants called tricyclic antidepressants like amitriptyline, imipramine, or nortriptyline -certain medicines to control heart rhythm like disopyramide, dofetilide, encainide, moricizine, procainamide, propafenone, and quinidine -cisapride -cyclobenzaprine -delavirdine -droperidol -haloperidol -hawthorn -imatinib -levomethadyl -maprotiline -medicines for malaria like chloroquine and halofantrine -pentamidine -phenothiazines like chlorpromazine, mesoridazine, prochlorperazine,  thioridazine -pimozide -quinine -ranolazine -ritonavir -sertindole -ziprasidone This medicine may also  interact with the following medications: -cimetidine -medicines for angina or high blood pressure -medicines to control heart rhythm like amiodarone and digoxin This list may not describe all possible interactions. Give your health care provider a list of all the medicines, herbs, non-prescription drugs, or dietary supplements you use. Also tell them if you smoke, drink alcohol, or use illegal drugs. Some items may interact with your medicine. What should I watch for while using this medicine? Visit your doctor or health care professional for regular checks on your progress. Because your condition and the use of this medicine carries some risk, it is a good idea to carry an identification card, necklace or bracelet with details of your condition, medications and doctor or health care professional. Check your blood pressure and pulse rate regularly. Ask your health care professional what your blood pressure and pulse rate should be, and when you should contact him or her. Your doctor or health care professional also may schedule regular blood tests and electrocardiograms to check your progress. You may get drowsy or dizzy. Do not drive, use machinery, or do anything that needs mental alertness until you know how this medicine affects you. Do not stand or sit up quickly, especially if you are an older patient. This reduces the risk of dizzy or fainting spells. Alcohol can make you more dizzy, increase flushing and rapid heartbeats. Avoid alcoholic drinks. What side effects may I notice from receiving this medicine? Side effects that you should report to your doctor or health care professional as soon as possible: -chest pain, continued irregular heartbeats -difficulty breathing -swelling of the legs or feet -trembling, shaking -unusually weak or tired Side effects that usually do not require  medical attention (report to your doctor or health care professional if they continue or are bothersome): -blurred vision -constipation -headache -nausea, vomiting -stomach pain This list may not describe all possible side effects. Call your doctor for medical advice about side effects. You may report side effects to FDA at 1-800-FDA-1088. Where should I keep my medicine? Keep out of the reach of children. Store at room temperature between 15 and 30 degrees C (59 and 86 degrees F). Protect from light. Keep container tightly closed. Throw away any unused medicine after the expiration date. NOTE: This sheet is a summary. It may not cover all possible information. If you have questions about this medicine, talk to your doctor, pharmacist, or health care provider.  2019 Elsevier/Gold Standard (2007-12-25 16:46:09)     Exercise Stress Test  An exercise stress test is a test that is done to collect information about how your heart functions during exercise. The test is done while you are walking on a treadmill or using an exercise bike. The goal is to raise your heart rate and "stress" the heart. The heart is evaluated before, during, and after you exercise. An electrocardiogram (ECG) will be used to monitor the heart, and your blood pressure will also be monitored. In some cases, nuclear scanning or an ultrasound of the heart (echocardiogram) will also be done to evaluate your heart. An exercise stress test is done to look for coronary artery disease (CAD). The test may also be done to:  Evaluate your limits of exercise during cardiac rehabilitation.  Check for high blood pressure during exercise.  Check how well you can exercise after such treatments as coronary stenting or new medicines.  Check for problems with blood flow to your arms and legs during exercise. If you have an abnormal test result,  this may mean that you are not getting enough blood flow to your heart during exercise. More  testing may be needed to understand why your test was not normal. Tell a health care provider about:  Any allergies you have.  All medicines you are taking, including vitamins, herbs, eye drops, creams, and over-the-counter medicines.  Any blood disorders you have.  Any surgeries you have had.  Any medical conditions you have.  Whether you are pregnant or may be pregnant. What are the risks? Generally, this is a safe procedure. However, problems may occur, including:  Pain or pressure in the following areas: ? Chest. ? Jaw or neck. ? Between your shoulder blades. ? Down your left arm.  Dizziness or lightheadedness.  Shortness of breath.  Increased or irregular heartbeats.  Nausea or vomiting.  Heart attack (rare).  Life-threatening abnormal heart rhythm (rare). What happens before the procedure?  Follow instructions from your health care provider about eating or drinking restrictions. ? You may be told to avoid all forms of caffeine for 24 hours before the test. This includes coffee, tea (even decaffeinated tea), caffeinated sodas, chocolate, cocoa, and certain pain medicines.  Ask your health care provider about: ? Taking over-the-counter medicines, vitamins, herbs, and supplements. ? Changing or stopping your regular medicines. This is especially important if you are taking diabetes medicines or beta-blocker medicines.  If you have diabetes, ask how you are to take your insulin or pills. It is common to adjust your insulin dose the morning of the test.  If you are taking beta-blocker medicines, it is important to talk to your health care provider about these medicines well before the date of your test. Taking beta-blocker medicines may interfere with the test. In some cases, these medicines may need to be changed or stopped 24 hours or more before the test.  If you wear a nitroglycerin patch, it may need to be removed prior to the test. Ask your health care provider  if the patch should be removed before the test.  If you use an inhaler for any breathing condition, bring it with you to the test.  Do not apply lotions, powders, creams, or oils on your chest prior to the test.  Wear loose-fitting clothes and comfortable walking shoes.  Do not use any products that contain nicotine or tobacco, such as cigarettes and e-cigarettes, for 4 hours before the test or as told by your health care provider. If you need help quitting, ask your health care provider. What happens during the procedure?  Multiple electrodes will be attached to your chest.  Multiple wires will be attached to the electrodes. These will transfer the electrical impulses from your heart to the ECG machine. Your heart will be monitored both at rest and while exercising.  If you are also having an echocardiogram or nuclear scanning, images of your heart will be taken before and after you exercise.  A blood pressure cuff will be placed around your arm to measure your blood pressure throughout the test. You will feel it tighten and loosen throughout the test.  You will walk on a treadmill or use a stationary bike. If you cannot use these, you may be asked to turn a crank with your hands.  You will start at a slow pace or level on the exercise machine. The exercise difficulty will be slowly increased to raise your heart rate. In the case of a treadmill, the speed and incline will gradually be increased.  You may be  asked to periodically breathe into a tube. This measures the gases you breathe out.  You will be asked how you are feeling throughout the test. You will be asked to rate your level of exertion.  Tell the staff right away if you feel: ? Chest pain. ? Dizziness. ? Shortness of breath. ? Too fatigued to continue. ? Pain or aching in your legs or arms.  You will exercise until you have symptoms or until you reach a target heart rate. The test will also be stopped if you have changes  in your blood pressure or ECG readings, or if you develop an irregular heartbeat (arrhythmia). The procedure may vary among health care providers and hospitals. What happens after the procedure?  You will sit down and recover from the exercise. Your blood pressure, heart rate, and ECG will be monitored until you recover.  You may return to your normal schedule, including diet, activities, and medicines, unless your health care provider tells you otherwise.  It is up to you to get your test results. Ask your health care provider, or the department that is doing the test, when your results will be ready. Summary  An exercise stress test is a test that is done to collect information about how your heart functions during exercise.  This test is done to look for coronary artery disease (CAD).  During this test, you will walk on a treadmill or use an exercise bike to raise your heart rate.  It is important to follow instructions from your health care provider about eating and drinking restrictions before the test. This may include avoiding caffeine and certain medicines before the test. This information is not intended to replace advice given to you by your health care provider. Make sure you discuss any questions you have with your health care provider. Document Released: 08/18/2000 Document Revised: 10/25/2016 Document Reviewed: 10/25/2016 Elsevier Interactive Patient Education  2019 [employer].

## 2018-09-24 NOTE — Progress Notes (Signed)
Electrophysiology Office Note   Date:  09/24/2018   ID:  ALECK LOCKLIN, DOB Jul 11, 1935, MRN [id number]  PCP:  Aurea Graff.Marlou Sa, MD  Cardiologist:  Debara Pickett Primary Electrophysiologist:  Yu Cragun [name], MD    No chief complaint on file.    History of Present Illness: TORREY BALLINAS is a 84 y.o. male who is being seen today for the evaluation of PVCs at the request of Hilty, Nadean Corwin, MD. Presenting today for electrophysiology evaluation.  He has a history of PVCs.  He presented to his primary cardiologist with persistent palpitations and was noted to be in ventricular bigeminy.  He had a transthoracic echo that showed a decreased ejection fraction of 35 to 40%.  Holter monitoring showed 15% PVCs.    Today, he denies symptoms of chest pain, shortness of breath, orthopnea, PND, lower extremity edema, claudication, dizziness, presyncope, syncope, bleeding, or neurologic sequela. The patient is tolerating medications without difficulties.  Does note occasional palpitations.  His main symptoms are due to fatigue and weakness.  He usually plays golf twice a week but he has not been doing that over the last few weeks.   Past Medical History:  Diagnosis Date  . Arthritis   . Asthma    only if develops cold  . Dysrhythmia    h/o pvc's  . GERD (gastroesophageal reflux disease)   . Kidney stone on left side   . PVC (premature ventricular contraction)   . Recurrent upper respiratory infection (URI) 03/2011   Past Surgical History:  Procedure Laterality Date  . APPENDECTOMY    . BACK SURGERY     x 4  . CARDIAC CATHETERIZATION     july 2010-minor irregl  . CERVICAL SPINE SURGERY     x 2  . CHOLECYSTECTOMY     2008  . ERCP N/A 07/24/2017   Procedure: ENDOSCOPIC RETROGRADE CHOLANGIOPANCREATOGRAPHY (ERCP);  Surgeon: Clarene Essex, MD;  Location: [facility];  Service: Endoscopy;  Laterality: N/A;  . EYE SURGERY     bilat cataract  . INCISION AND DRAINAGE HIP  07/18/2011   Procedure: IRRIGATION AND DEBRIDEMENT HIP;  Surgeon: Garald Balding, MD;  Location: [facility];  Service: Orthopedics;  Laterality: Right;  RIGHT HIP EXPLORATION, EXCISION OF DEEP BURSAL SAC  . KIDNEY STONE SURGERY  2011   R  . KNEE ARTHROSCOPY     Left  . SHOULDER ARTHROSCOPY     Left  . TOTAL HIP ARTHROPLASTY     Right  . TOTAL HIP REVISION     Right  . TOTAL KNEE ARTHROPLASTY Right 03/31/2014   Procedure: TOTAL KNEE ARTHROPLASTY;  Surgeon: Garald Balding, MD;  Location: [facility];  Service: Orthopedics;  Laterality: Right;  . TOTAL KNEE ARTHROPLASTY Left 08/01/2016   Procedure: TOTAL KNEE ARTHROPLASTY;  Surgeon: Garald Balding, MD;  Location: [facility];  Service: Orthopedics;  Laterality: Left;  . WRIST SURGERY     bilateral, plates     Current Outpatient Medications  Medication Sig Dispense Refill  . albuterol (PROVENTIL HFA;VENTOLIN HFA) 108 (90 BASE) MCG/ACT inhaler Inhale 2 puffs into the lungs every 6 (six) hours as needed for wheezing or shortness of breath. 1 Inhaler 3  . Ascorbic Acid (VITAMIN C) 500 MG tablet Take 1 tablet (500 mg total) by mouth daily. 30 tablet   . diclofenac sodium (VOLTAREN) 1 % GEL Apply 4 g topically 3 (three) times daily as needed (Apply to large joint up to 3 times daily as needed). 3  Tube 3  . metoprolol succinate (TOPROL-XL) 25 MG 24 hr tablet TAKE 1 TABLET ONCE DAILY. 90 tablet 2  . potassium chloride SA (K-DUR,KLOR-CON) 20 MEQ tablet TAKE 1 TABLET ONCE DAILY. 90 tablet 2   No current facility-administered medications for this visit.     Allergies:   Tape; Erythromycin; Sulfa antibiotics; and Tetracyclines & related   Social History:  The patient  reports that he has never smoked. He has never used smokeless tobacco. He reports that he does not drink alcohol or use drugs.   Family History:  The patient's family history includes Heart attack in his mother; Heart disease in his mother; Suicidality in his mother.    ROS:  Please see the history of  present illness.   Otherwise, review of systems is positive nose bleeds.   All other systems are reviewed and negative.    PHYSICAL EXAM: VS:  Ht 6\' 4"  (1.93 m)   Wt 237 lb (107.5 kg)   BMI 28.85 kg/m  , BMI Body mass index is 28.85 kg/m. GEN: Well nourished, well developed, in no acute distress  HEENT: normal  Neck: no JVD, carotid bruits, or masses Cardiac: iRRR; no murmurs, rubs, or gallops,no edema  Respiratory:  clear to auscultation bilaterally, normal work of breathing GI: soft, nontender, nondistended, + BS MS: no deformity or atrophy  Skin: warm and dry Neuro:  Strength and sensation are intact Psych: euthymic mood, full affect  EKG:  EKG is ordered today. Personal review of the ekg ordered shows sinus rhythm, ventricular bigeminy, rate 69, right bundle branch block  Recent Labs: No results found for requested labs within last 8760 hours.    Lipid Panel     Component Value Date/Time   CHOL 164 10/15/2015 0950   TRIG 115 10/15/2015 0950   HDL 47 10/15/2015 0950   CHOLHDL 3.5 10/15/2015 0950   VLDL 23 10/15/2015 0950   LDLCALC 94 10/15/2015 0950     Wt Readings from Last 3 Encounters:  09/24/18 237 lb (107.5 kg)  08/16/18 237 lb (107.5 kg)  05/20/18 235 lb 12.8 oz (107 kg)      Other studies Reviewed: Additional studies/ records that were reviewed today include: TTE 08/26/18  Review of the above records today demonstrates:  - Left ventricle: The cavity size was normal. Wall thickness was   increased in a pattern of mild LVH. Systolic function was   moderately reduced. The estimated ejection fraction was in the   range of 35% to 40%. Diffuse hypokinesis. Doppler parameters are   consistent with abnormal left ventricular relaxation (grade 1   diastolic dysfunction). Doppler parameters are consistent with   high ventricular filling pressure. - Aortic valve: Transvalvular velocity was within the normal range.   There was no stenosis. There was trivial  regurgitation. - Mitral valve: Transvalvular velocity was within the normal range.   There was no evidence for stenosis. There was trivial   regurgitation. - Left atrium: The atrium was mildly dilated. - Right ventricle: The cavity size was normal. Wall thickness was   normal. Systolic function was normal. - Pulmonary arteries: Systolic pressure was within the normal   range. PA peak pressure: 34 mm Hg (S). - Pericardium, extracardiac: A trivial pericardial effusion was   identified.  Holter 08/30/18 - personally reviewed Sinus rhythm with frequent PVC's (burden ~15%) and infrequent PAC's.  ASSESSMENT AND PLAN:  1.  PVCs: Currently he is in ventricular bigeminy.  He also has a right bundle branch  block underlying.  I do think that he would benefit from further therapy.  I Sanoe Hazan thus start him on flecainide.  We also discussed ablation which would be the next best option.  He would like to avoid invasive procedures.  We Jorian Willhoite get an exercise treadmill test to see how he tolerates the medication with his right bundle branch block.  2.  Hypertension: Currently well controlled  3.  Nonischemic cardiomyopathy: Likely due to high PVC burden.  Planning to start flecainide.  We Roshard Rezabek repeat his echocardiogram once his PVCs are reduced.  Current medicines are reviewed at length with the patient today.   The patient does not have concerns regarding his medicines.  The following changes were made today:  none  Labs/ tests ordered today include:  Orders Placed This Encounter  Procedures  . EKG 12-Lead   Case discussed with referring physician  Disposition:   FU with Enio Hornback 3 months  Signed, Annika Selke [name], MD  09/24/2018 9:13 AM     [facility] [address] [place] [address] [phone] (office) [phone] (fax)

## 2018-10-08 ENCOUNTER — Ambulatory Visit (INDEPENDENT_AMBULATORY_CARE_PROVIDER_SITE_OTHER): Payer: Medicare Other

## 2018-10-08 DIAGNOSIS — I493 Ventricular premature depolarization: Secondary | ICD-10-CM | POA: Diagnosis not present

## 2018-10-08 LAB — EXERCISE TOLERANCE TEST
CHL CUP MPHR: 139 {beats}/min
CSEPEW: 7 METS
CSEPPHR: 115 {beats}/min
Exercise duration (min): 5 min
Exercise duration (sec): 0 s
Percent HR: 82 %
RPE: 17
Rest HR: 54 {beats}/min

## 2018-10-09 ENCOUNTER — Telehealth: Payer: Self-pay | Admitting: *Deleted

## 2018-10-09 NOTE — Telephone Encounter (Signed)
Patient notified of result.  Please refer to phone note from today for complete details.   Julaine Hua, [facility] 10/09/2018 11:04 AM   Pt aware of stress test results by phone with verbal understanding. Pt thanked me for the call.

## 2018-10-09 NOTE — Telephone Encounter (Signed)
-----   Message from [name], MD sent at 10/09/2018  9:02 AM EST ----- Short episodes of PVCs otherwise no major abnormalities.  Continue current management.

## 2018-11-04 ENCOUNTER — Ambulatory Visit (INDEPENDENT_AMBULATORY_CARE_PROVIDER_SITE_OTHER): Payer: Medicare Other | Admitting: Internal Medicine

## 2018-11-04 ENCOUNTER — Encounter: Payer: Self-pay | Admitting: Internal Medicine

## 2018-11-04 VITALS — BP 136/76 | HR 55 | Ht 76.0 in | Wt 239.2 lb

## 2018-11-04 DIAGNOSIS — I499 Cardiac arrhythmia, unspecified: Secondary | ICD-10-CM | POA: Diagnosis not present

## 2018-11-04 DIAGNOSIS — I498 Other specified cardiac arrhythmias: Secondary | ICD-10-CM

## 2018-11-04 DIAGNOSIS — Z79899 Other long term (current) drug therapy: Secondary | ICD-10-CM

## 2018-11-04 DIAGNOSIS — R0600 Dyspnea, unspecified: Secondary | ICD-10-CM | POA: Insufficient documentation

## 2018-11-04 DIAGNOSIS — I428 Other cardiomyopathies: Secondary | ICD-10-CM

## 2018-11-04 LAB — COMPREHENSIVE METABOLIC PANEL
A/G RATIO: 1.8 (ref 1.2–2.2)
ALT: 17 IU/L (ref 0–44)
AST: 19 IU/L (ref 0–40)
Albumin: 4.4 g/dL (ref 3.6–4.6)
Alkaline Phosphatase: 54 IU/L (ref 39–117)
BILIRUBIN TOTAL: 1.2 mg/dL (ref 0.0–1.2)
BUN/Creatinine Ratio: 16 (ref 10–24)
BUN: 16 mg/dL (ref 8–27)
CHLORIDE: 101 mmol/L (ref 96–106)
CO2: 23 mmol/L (ref 20–29)
Calcium: 9.2 mg/dL (ref 8.6–10.2)
Creatinine, Ser: 1 mg/dL (ref 0.76–1.27)
GFR calc non Af Amer: 70 mL/min/{1.73_m2} (ref >59)
GFR, EST AFRICAN AMERICAN: 81 mL/min/{1.73_m2} (ref >59)
GLUCOSE: 112 mg/dL — AB (ref 65–99)
Globulin, Total: 2.4 g/dL (ref 1.5–4.5)
POTASSIUM: 4.5 mmol/L (ref 3.5–5.2)
Sodium: 142 mmol/L (ref 134–144)
TOTAL PROTEIN: 6.8 g/dL (ref 6.0–8.5)

## 2018-11-04 LAB — CBC
Hematocrit: 49.4 % (ref 37.5–51.0)
Hemoglobin: 16.7 g/dL (ref 13.0–17.7)
MCH: 30.9 pg (ref 26.6–33.0)
MCHC: 33.8 g/dL (ref 31.5–35.7)
MCV: 92 fL (ref 79–97)
Platelets: 159 10*3/uL (ref 150–450)
RBC: 5.4 x10E6/uL (ref 4.14–5.80)
RDW: 12.7 % (ref 11.6–15.4)
WBC: 6.1 10*3/uL (ref 3.4–10.8)

## 2018-11-04 MED ORDER — LOSARTAN POTASSIUM 25 MG PO TABS: 25.0000 mg | ORAL_TABLET | Freq: Every day | ORAL | 3 refills | Status: DC

## 2018-11-04 NOTE — Patient Instructions (Signed)
Medication Instructions:  Start: Losartan 25 mg daily  If you need a refill on your cardiac medications before your next appointment, please call your pharmacy.   Lab work: Your physician recommends that you return for lab work in 1 week ( CMP, CBC)  If you have labs (blood work) drawn today and your tests are completely normal, you will receive your results only by: Marland Kitchen MyChart Message (if you have MyChart) OR . A paper copy in the mail If you have any lab test that is abnormal or we need to change your treatment, we will call you to review the results.  Testing/Procedures: Your physician has requested that you have an echocardiogram in 6 months. Echocardiography is a painless test that uses sound waves to create images of your heart. It provides your doctor with information about the size and shape of your heart and how well your heart's chambers and valves are working. This procedure takes approximately one hour. There are no restrictions for this procedure. [place] [address]   Follow-Up: At [facility], you and your health needs are our priority.  As part of our continuing mission to provide you with exceptional heart care, we have created designated Provider Care Teams.  These Care Teams include your primary Cardiologist (physician) and Advanced Practice Providers (APPs -  Physician Assistants and Nurse Practitioners) who all work together to provide you with the care you need, when you need it. You will need a follow up appointment in 6 months.  Please call our office 2 months in advance to schedule this appointment.  You may see Pixie Casino, MD or one of the following Advanced Practice Providers on your designated Care Team: [place], [place] . Fabian Sharp, PA-C

## 2018-11-04 NOTE — Progress Notes (Signed)
OFFICE NOTE  Chief Complaint:  Follow-up PVCs, nosebleeds   Primary Care Physician: [name], George Sa, MD  HPI:  [name] is a 84 y.o. male who is a former patient of Dr. Mare Ferrari. His wife is also a patient of mine. He has a history of PVCs and is on medicine for suppression of those PVCs and also possible hypertension. Blood pressure initially 155/83 became down to 120/78. He was noted to have a few PVCs today but said he did not take his medicine this morning. Other than that he denies any medical problems. He has a history of multiple orthopedic surgeries and gallbladder surgery in the past. He has a chronic left-sided chest pain which is sharp and he can point to with his finger. This is along the left sternal border and generally does not move, worsened with exertion or is not relieved by rest. He's had 2 heart catheterizations last 5 years, neither study indicated any significant obstructive coronary disease.  11/23/2016  [name] returns today for follow-up. He denies any chest pain or worsening shortness of breath. He does have some PVCs however he is unaware of them. He is on once daily metoprolol tartrate. Blood pressure is at goal today 122/70.  05/31/2017  [name] was seen today in follow-up. He reports 2 episodes of recent chest pain. One episode was associated with stomach upset and pain that radiated up the center of the chest to his neck. He had some associated nausea and diaphoresis. He had a second episode which might of occurred after a spicy meal that was similar although he ended up vomiting after which she felt better. He denied any exercise intolerance, is able to exercise regularly, recently went on a long walking trip vacation and has no symptoms with this activity. He has a history of a false positive stress test in the past to prior cardiac catheterizations in the last 5 years which were negative.  11/14/2017  [name] comes back today for  follow-up.  He is without complaints currently.  He was having atypical chest pain symptoms which I felt more likely GI in etiology.  He ultimately was found to have bile duct stones, although had a history of prior cholecystectomy.  The stones were extracted and he has since improved significantly.  He is in fact off of his PPI medication.  Blood pressures been well controlled.  He is active and denies any chest pain or worsening shortness of breath.  05/20/2018  Mr. [name] seen today in follow-up with his wife.  He continues to be without complaints.  Since he had a bile duct stones removed he is had no further chest discomfort.  He denies any shortness of breath or chest pain.  Blood pressure is at goal.  08/16/2018  [name] returns today for follow-up.  He has had some persistent palpitations.  EKG today shows PVCs in a bigeminal pattern.  Blood pressure is well controlled on metoprolol.  He also reports some shortness of breath, making me concerned a little about of whether he has developed any cardiomyopathy related to his PVCs.  The exact burden is not clear.  11/04/2018  [name] returns today for follow-up of his PVCs.  I referred him to Dr. Curt Christian after worsening cardiomyopathy was found to have an EF around 30 to 35%.  He was found also to have a PVC burden of around 15%.  After seeing Dr. Curt Christian, he was placed on flecainide and does feel like  he has had a reduction in his PVCs.  Heart rate is in the 50s today.  Blood pressure is normal.  He is also complaining of nosebleeds.  Also he had had some superficial bleeding of the scrotum, for which she said may have been related to itching and is noted to have conjunctival hemorrhage today which is likely spontaneous.  PMHx:  Past Medical History:  Diagnosis Date  . Arthritis   . Asthma    only if develops cold  . Dysrhythmia    h/o pvc's  . GERD (gastroesophageal reflux disease)   . Kidney stone on left side   . PVC (premature  ventricular contraction)   . Recurrent upper respiratory infection (URI) 03/2011    Past Surgical History:  Procedure Laterality Date  . APPENDECTOMY    . BACK SURGERY     x 4  . CARDIAC CATHETERIZATION     july 2010-minor irregl  . CERVICAL SPINE SURGERY     x 2  . CHOLECYSTECTOMY     2008  . ERCP N/A 07/24/2017   Procedure: ENDOSCOPIC RETROGRADE CHOLANGIOPANCREATOGRAPHY (ERCP);  Surgeon: George Essex, MD;  Location: [facility];  Service: Endoscopy;  Laterality: N/A;  . EYE SURGERY     bilat cataract  . INCISION AND DRAINAGE HIP  07/18/2011   Procedure: IRRIGATION AND DEBRIDEMENT HIP;  Surgeon: George Balding, MD;  Location: [place];  Service: Orthopedics;  Laterality: Right;  RIGHT HIP EXPLORATION, EXCISION OF DEEP BURSAL SAC  . KIDNEY STONE SURGERY  2011   R  . KNEE ARTHROSCOPY     Left  . SHOULDER ARTHROSCOPY     Left  . TOTAL HIP ARTHROPLASTY     Right  . TOTAL HIP REVISION     Right  . TOTAL KNEE ARTHROPLASTY Right 03/31/2014   Procedure: TOTAL KNEE ARTHROPLASTY;  Surgeon: George Balding, MD;  Location: [place];  Service: Orthopedics;  Laterality: Right;  . TOTAL KNEE ARTHROPLASTY Left 08/01/2016   Procedure: TOTAL KNEE ARTHROPLASTY;  Surgeon: George Balding, MD;  Location: [facility];  Service: Orthopedics;  Laterality: Left;  . WRIST SURGERY     bilateral, plates    FAMHx:  Family History  Problem Relation Age of Onset  . Heart disease Mother   . Heart attack Mother   . Suicidality Mother   . Anesthesia problems Neg Hx   . Hypotension Neg Hx   . Malignant hyperthermia Neg Hx   . Pseudochol deficiency Neg Hx     SOCHx:   reports that he has never smoked. He has never used smokeless tobacco. He reports that he does not drink alcohol or use drugs.  ALLERGIES:  Allergies  Allergen Reactions  . Tape Other (See Comments)    Adhesive tape blisters  . Erythromycin Rash  . Sulfa Antibiotics Rash  . Tetracyclines & Related Rash    ROS: Pertinent items  noted in HPI and remainder of comprehensive ROS otherwise negative.  HOME MEDS: Current Outpatient Medications  Medication Sig Dispense Refill  . albuterol (PROVENTIL HFA;VENTOLIN HFA) 108 (90 BASE) MCG/ACT inhaler Inhale 2 puffs into the lungs every 6 (six) hours as needed for wheezing or shortness of breath. 1 Inhaler 3  . Ascorbic Acid (VITAMIN C) 500 MG tablet Take 1 tablet (500 mg total) by mouth daily. 30 tablet   . diclofenac sodium (VOLTAREN) 1 % GEL Apply 4 g topically 3 (three) times daily as needed (Apply to large joint up to 3 times daily as  needed). 3 Tube 3  . flecainide (TAMBOCOR) 100 MG tablet Take 1 tablet (100 mg total) by mouth 2 (two) times daily. 60 tablet 3  . metoprolol succinate (TOPROL-XL) 25 MG 24 hr tablet TAKE 1 TABLET ONCE DAILY. 90 tablet 2  . potassium chloride Christian (K-DUR,KLOR-CON) 20 MEQ tablet TAKE 1 TABLET ONCE DAILY. 90 tablet 2   No current facility-administered medications for this visit.     LABS/IMAGING: No results found for this or any previous visit (from the past 48 hour(s)). No results found.  WEIGHTS: Wt Readings from Last 3 Encounters:  11/04/18 239 lb 3.2 oz (108.5 kg)  09/24/18 237 lb (107.5 kg)  08/16/18 237 lb (107.5 kg)    VITALS: BP 136/76   Pulse (!) 55   Ht 6\' 4"  (1.93 m)   Wt 239 lb 3.2 oz (108.5 kg)   SpO2 94%   BMI 29.12 kg/m   EXAM: General appearance: alert and no distress Neck: no carotid bruit and no JVD Lungs: clear to auscultation bilaterally Heart: regular rate and rhythm, S1, S2 normal, no murmur, click, rub or gallop Abdomen: soft, non-tender; bowel sounds normal; no masses,  no organomegaly Extremities: extremities normal, atraumatic, no cyanosis or edema Pulses: 2+ and symmetric Skin: Skin color, texture, turgor normal. No rashes or lesions Neurologic: Grossly normal Psych: Pleasant  EKG: Deferred  ASSESSMENT: 1. Symptomatic bigeminal PVCs 2. Acute systolic heart failure-suspect PVC  mediated 3. Shortness of breath 4. RBBB 5. Hypertension  PLAN: 1.   Mr. Lutes had a high burden of PVCs but is now on flecainide in addition to metoprolol.  He reports some improvement in this.  He is asymptomatic with it.  Hopefully this will lead to some improvement in his cardiomyopathy however adequate traditional heart failure therapy is recommended.  He is on metoprolol.  I would recommend adding low-dose losartan 25 mg daily as there is some room and blood pressure for this.  We will repeat an echocardiogram in 6 months to see if he has had improvement in LVEF.  Finally, he is noticed some recent nosebleeds, bleeding from the scrotum and a conjunctival hemorrhage which is apparently idiopathic.  This raises a question of whether there could be some medication related reason for this.  Apparently flecainide can affect the cell counts, therefore we will go ahead and check a CBC to make sure he is not significantly thrombocytopenic.  All of these symptoms seem to of started after starting the medication.  Follow-up 6 months.  Pixie Casino, MD, [facility], [place] Director of the [facility]  [facility] Diplomate of the American Board of Clinical Lipidology Attending Cardiologist  Direct Dial: [phone]  Fax: [phone]  Website:  [email or web address]  11/04/2018, 9:13 AM

## 2018-11-11 ENCOUNTER — Other Ambulatory Visit: Payer: Self-pay | Admitting: Internal Medicine

## 2018-11-11 DIAGNOSIS — I119 Hypertensive heart disease without heart failure: Secondary | ICD-10-CM

## 2018-12-02 DIAGNOSIS — J45909 Unspecified asthma, uncomplicated: Secondary | ICD-10-CM | POA: Diagnosis not present

## 2018-12-02 DIAGNOSIS — L259 Unspecified contact dermatitis, unspecified cause: Secondary | ICD-10-CM | POA: Diagnosis not present

## 2018-12-17 ENCOUNTER — Telehealth: Payer: Self-pay | Admitting: *Deleted

## 2018-12-17 NOTE — Telephone Encounter (Signed)
Called patient to let them know due to recent [place] and Health Department Protocols, we are not seeing patients in the office. We are instead seeing if they would like to schedule this appointment as a Nurse, children's or Laptop. Patient is aware if they decide to reschedule this appointment, they may not be seen or scheduled for the next 4-6 months. Patient at this time declines Virtual Visits. Patient prefers to be seen in person at a later date.  Message sent scheduling and nurse.   Concerns and/or Complaints:                    Since your last visit or hospitalization:   1. Have you been having new or worsening chest pain? No 2. Have you been having new or worsening shortness of breath? No  3. Have you been having new or worsening leg swelling, wt gain, or increase in abdominal girth (pants fitting more tightly)? No  4. Have you had any passing out spells? No 5. Have you had any extreme tiredness or fatigue? No

## 2018-12-20 NOTE — Telephone Encounter (Signed)
Followed up with patient who reports he is doing well.  Pt feels very strongly that he would prefer to wait and see MD in person, in August, when safer time to do so. Reports doing well on the Flecainide with no SE.   Pt denies palpitations, dizziness, syncope, SOB, weakness, fatigue, swelling, or weight gain. Pt advised to call the office is issues arise. Pt rescheduled to August. Pt appreciates my follow up and agreeable to plan stated above.

## 2018-12-24 ENCOUNTER — Ambulatory Visit: Payer: Medicare Other | Admitting: Cardiology

## 2019-01-17 ENCOUNTER — Other Ambulatory Visit: Payer: Self-pay | Admitting: Cardiology

## 2019-02-17 DIAGNOSIS — H04123 Dry eye syndrome of bilateral lacrimal glands: Secondary | ICD-10-CM | POA: Diagnosis not present

## 2019-02-17 DIAGNOSIS — H43813 Vitreous degeneration, bilateral: Secondary | ICD-10-CM | POA: Diagnosis not present

## 2019-02-17 DIAGNOSIS — H52203 Unspecified astigmatism, bilateral: Secondary | ICD-10-CM | POA: Diagnosis not present

## 2019-02-17 DIAGNOSIS — Z961 Presence of intraocular lens: Secondary | ICD-10-CM | POA: Diagnosis not present

## 2019-03-13 ENCOUNTER — Encounter: Payer: Self-pay | Admitting: Orthopaedic Surgery

## 2019-03-13 ENCOUNTER — Ambulatory Visit (INDEPENDENT_AMBULATORY_CARE_PROVIDER_SITE_OTHER): Payer: Medicare Other | Admitting: Orthopaedic Surgery

## 2019-03-13 ENCOUNTER — Ambulatory Visit (INDEPENDENT_AMBULATORY_CARE_PROVIDER_SITE_OTHER): Payer: Medicare Other

## 2019-03-13 ENCOUNTER — Other Ambulatory Visit: Payer: Self-pay

## 2019-03-13 VITALS — BP 163/88 | HR 57 | Resp 14 | Ht 76.0 in | Wt 235.0 lb

## 2019-03-13 DIAGNOSIS — M25562 Pain in left knee: Secondary | ICD-10-CM | POA: Diagnosis not present

## 2019-03-13 MED ORDER — LIDOCAINE HCL 1 % IJ SOLN
2.0000 mL | INTRAMUSCULAR | Status: AC | PRN
  Administered 2019-03-13: 18:00:00 2 mL

## 2019-03-13 MED ORDER — BUPIVACAINE HCL 0.5 % IJ SOLN
2.0000 mL | INTRAMUSCULAR | Status: AC | PRN
  Administered 2019-03-13: 2 mL via INTRA_ARTICULAR

## 2019-03-13 MED ORDER — METHYLPREDNISOLONE ACETATE 40 MG/ML IJ SUSP
80.0000 mg | INTRAMUSCULAR | Status: AC | PRN
  Administered 2019-03-13: 80 mg via INTRA_ARTICULAR

## 2019-03-13 NOTE — Progress Notes (Signed)
Office Visit Note   Patient: [name]           Date of Birth: 10/06/1934           MRN: [id number] Visit Date: 03/13/2019              Requested by: Alroy Dust, L.Marlou Sa, [place] [employer] [place],  [place] [id number] PCP: Alroy Dust, L.Marlou Sa, MD   Assessment & Plan: Visit Diagnoses:  1. Acute pain of left knee     Plan: Recent onset of left knee pain without obvious injury or trauma.  3 years status post primary left total knee replacement doing well until just recently.  I aspirated 40 cc of somewhat cloudy fluid and will send to the lab.  No obvious infection.  Injected knee with cortisone.  Will monitor response  Follow-Up Instructions: Return if symptoms worsen or fail to improve.   Orders:  Orders Placed This Encounter  Procedures  . Large Joint Inj: L knee  . Anaerobic and Aerobic Culture  . Gram stain  . XR KNEE 3 VIEW LEFT  . Synovial cell count + diff, w/ crystals   No orders of the defined types were placed in this encounter.     Procedures: Large Joint Inj: L knee on 03/13/2019 5:40 PM Indications: pain and diagnostic evaluation Details: 25 G 1.5 in needle, anteromedial approach  Arthrogram: No  Medications: 2 mL lidocaine 1 %; 2 mL bupivacaine 0.5 %; 80 mg methylPREDNISolone acetate 40 MG/ML Aspirate: 40 mL yellow and cloudy; sent for lab analysis Procedure, treatment alternatives, risks and benefits explained, specific risks discussed. Consent was given by the patient. Patient was prepped and draped in the usual sterile fashion.       Clinical Data: No additional findings.   Subjective: Chief Complaint  Patient presents with  . Left Knee - Pain  patient presents in office with left knee pain, stinging and burning. patient has been using ice without much relief. Patient states he feels as though his knee cap is "moving."  3 years status post primary left total knee replacement and no problem with his knee until just recently.  Feels  like his "kneecap is out of place".  No obvious injury or trauma.  No swelling distally.  Knee has been a little "tight" HPI  Review of Systems  Constitutional: Negative for chills, fatigue and fever.  HENT: Negative for sneezing and sore throat.   Eyes: Negative for pain and redness.  Respiratory: Negative for shortness of breath and wheezing.   Cardiovascular: Negative for chest pain.  Gastrointestinal: Negative for abdominal pain, constipation and diarrhea.  Endocrine: Negative for polyuria.  Genitourinary: Negative for frequency.  Musculoskeletal: Negative for neck pain and neck stiffness.  Skin: Negative for rash.  Allergic/Immunologic: Negative for immunocompromised state.  Neurological: Negative for dizziness and headaches.  Hematological: Does not bruise/bleed easily.  Psychiatric/Behavioral: The patient is not nervous/anxious.      Objective: Vital Signs: BP (!) 163/88 (BP Location: Right Arm, Patient Position: Sitting, Cuff Size: Normal)   Pulse (!) 57   Resp 14   Ht 6\' 4"  (1.93 m)   Wt 235 lb (106.6 kg)   BMI 28.61 kg/m   Physical Exam Constitutional:      Appearance: He is well-developed.  Eyes:     Pupils: Pupils are equal, round, and reactive to light.  Pulmonary:     Effort: Pulmonary effort is normal.  Skin:    General: Skin is warm  and dry.  Neurological:     Mental Status: He is alert and oriented to person, place, and time.  Psychiatric:        Behavior: Behavior normal.     Ortho Exam left knee with full extension of flexion over 110 degrees.  No instability.  Does have some pain along the patella but it appears to be intact.  Moves normally.  Positive effusion.  Knee was not hot warm or red.  No opening with a varus or valgus stress.  No distal edema.  Specialty Comments:  No specialty comments available.  Imaging: Xr Knee 3 View Left  Result Date: 03/13/2019 Films of the left total knee replacement obtained in 3 projections.  No obvious  abnormalities.  Excellent glue mantle and alignment of components.    PMFS History: Patient Active Problem List   Diagnosis Date Noted  . Acute pain of left knee 03/13/2019  . Nonischemic cardiomyopathy ([place]) 11/04/2018  . Dyspnea 11/04/2018  . Ventricular bigeminy 08/16/2018  . Gastroesophageal reflux disease 05/31/2017  . S/P total knee replacement using cement, left 08/01/2016  . RBBB (right bundle branch block with left anterior fascicular block) 04/12/2016  . PVC's (premature ventricular contractions) 04/12/2016  . Chest wall pain 04/12/2016  . Bronchial asthma 06/29/2014  . Urinary retention due to benign prostatic hyperplasia 04/03/2014  . Ileus, postoperative ([place]) 04/03/2014  . Osteoarthritis of right knee 04/01/2014  . S/P total knee replacement using cement 03/31/2014  . Skin rash 04/11/2013  . Pseudogout of knee 12/12/2012  . Dyspepsia 08/14/2012  . Trochanteric Bursitis of right hip 07/18/2011  . Benign hypertensive heart disease without heart failure 12/13/2010  . BPH (benign prostatic hyperplasia) 12/13/2010  . Unilateral primary osteoarthritis, left knee 12/13/2010  . Dyslipidemia 12/13/2010  . Status post cholecystectomy 12/13/2010   Past Medical History:  Diagnosis Date  . Arthritis   . Asthma    only if develops cold  . Dysrhythmia    h/o pvc's  . GERD (gastroesophageal reflux disease)   . Kidney stone on left side   . PVC (premature ventricular contraction)   . Recurrent upper respiratory infection (URI) 03/2011    Family History  Problem Relation Age of Onset  . Heart disease Mother   . Heart attack Mother   . Suicidality Mother   . Anesthesia problems Neg Hx   . Hypotension Neg Hx   . Malignant hyperthermia Neg Hx   . Pseudochol deficiency Neg Hx     Past Surgical History:  Procedure Laterality Date  . APPENDECTOMY    . BACK SURGERY     x 4  . CARDIAC CATHETERIZATION     july 2010-minor irregl  . CERVICAL SPINE SURGERY     x 2  .  CHOLECYSTECTOMY     2008  . ERCP N/A 07/24/2017   Procedure: ENDOSCOPIC RETROGRADE CHOLANGIOPANCREATOGRAPHY (ERCP);  Surgeon: Clarene Essex, MD;  Location: [facility];  Service: Endoscopy;  Laterality: N/A;  . EYE SURGERY     bilat cataract  . INCISION AND DRAINAGE HIP  07/18/2011   Procedure: IRRIGATION AND DEBRIDEMENT HIP;  Surgeon: Garald Balding, MD;  Location: [facility];  Service: Orthopedics;  Laterality: Right;  RIGHT HIP EXPLORATION, EXCISION OF DEEP BURSAL SAC  . KIDNEY STONE SURGERY  2011   R  . KNEE ARTHROSCOPY     Left  . SHOULDER ARTHROSCOPY     Left  . TOTAL HIP ARTHROPLASTY     Right  . TOTAL HIP REVISION  Right  . TOTAL KNEE ARTHROPLASTY Right 03/31/2014   Procedure: TOTAL KNEE ARTHROPLASTY;  Surgeon: Garald Balding, MD;  Location: [facility];  Service: Orthopedics;  Laterality: Right;  . TOTAL KNEE ARTHROPLASTY Left 08/01/2016   Procedure: TOTAL KNEE ARTHROPLASTY;  Surgeon: Garald Balding, MD;  Location: [facility];  Service: Orthopedics;  Laterality: Left;  . WRIST SURGERY     bilateral, plates   Social History   Occupational History  . Not on file  Tobacco Use  . Smoking status: Never Smoker  . Smokeless tobacco: Never Used  Substance and Sexual Activity  . Alcohol use: No    Alcohol/week: 0.0 standard drinks  . Drug use: No  . Sexual activity: Not on file

## 2019-03-14 ENCOUNTER — Ambulatory Visit: Payer: Medicare Other | Admitting: Orthopaedic Surgery

## 2019-03-19 LAB — SYNOVIAL CELL COUNT + DIFF, W/ CRYSTALS
Basophils, %: 0 %
Eosinophils-Synovial: 0 % (ref 0–2)
Lymphocytes-Synovial Fld: 45 % (ref 0–74)
Monocyte/Macrophage: 44 % (ref 0–69)
Neutrophil, Synovial: 9 % (ref 0–24)
Synoviocytes, %: 2 % (ref 0–15)
WBC, Synovial: 679 cells/uL — ABNORMAL HIGH (ref <150)

## 2019-03-19 LAB — ANAEROBIC AND AEROBIC CULTURE
AER RESULT:: NO GROWTH
GRAM STAIN:: NONE SEEN
MICRO NUMBER:: 652388
MICRO NUMBER:: 652436
SPECIMEN QUALITY:: ADEQUATE
SPECIMEN QUALITY:: ADEQUATE

## 2019-04-15 ENCOUNTER — Ambulatory Visit (INDEPENDENT_AMBULATORY_CARE_PROVIDER_SITE_OTHER): Payer: Medicare Other | Admitting: Cardiology

## 2019-04-15 ENCOUNTER — Other Ambulatory Visit: Payer: Self-pay

## 2019-04-15 ENCOUNTER — Encounter: Payer: Self-pay | Admitting: Cardiology

## 2019-04-15 VITALS — BP 124/58 | HR 54 | Ht 76.0 in | Wt 234.4 lb

## 2019-04-15 DIAGNOSIS — I493 Ventricular premature depolarization: Secondary | ICD-10-CM | POA: Diagnosis not present

## 2019-04-15 NOTE — Progress Notes (Signed)
Electrophysiology Office Note   Date:  04/15/2019   ID:  [name], DOB 10/17/1934, MRN [id number]  PCP:  George Graff.Marlou Sa, MD  Cardiologist:  [name] Primary Electrophysiologist:  George Fullam [name], MD    No chief complaint on file.    History of Present Illness: [name] is a 84 y.o. male who is being seen today for the evaluation of PVCs at the request of [name], L.Marlou Sa, MD. Presenting today for electrophysiology evaluation.  He has a history of PVCs.  He presented to his primary cardiologist with persistent palpitations and was noted to be in ventricular bigeminy.  He had a transthoracic echo that showed a decreased ejection fraction of 35 to 40%.  Holter monitoring showed 15% PVCs.  Today, denies symptoms of palpitations, chest pain, shortness of breath, orthopnea, PND, lower extremity edema, claudication, dizziness, presyncope, syncope, bleeding, or neurologic sequela. The patient is tolerating medications without difficulties.  Overall he is doing well.  He notes less palpitations since starting the flecainide.  He continues to be able to do all of his daily activities without restriction.   Past Medical History:  Diagnosis Date   Arthritis    Asthma    only if develops cold   Dysrhythmia    h/o pvc's   GERD (gastroesophageal reflux disease)    Kidney stone on left side    PVC (premature ventricular contraction)    Recurrent upper respiratory infection (URI) 03/2011   Past Surgical History:  Procedure Laterality Date   APPENDECTOMY     BACK SURGERY     x 4   CARDIAC CATHETERIZATION     july 2010-minor irregl   CERVICAL SPINE SURGERY     x 2   CHOLECYSTECTOMY     2008   ERCP N/A 07/24/2017   Procedure: ENDOSCOPIC RETROGRADE CHOLANGIOPANCREATOGRAPHY (ERCP);  Surgeon: George Essex, MD;  Location: [facility];  Service: Endoscopy;  Laterality: N/A;   EYE SURGERY     bilat cataract   INCISION AND DRAINAGE HIP  07/18/2011   Procedure:  IRRIGATION AND DEBRIDEMENT HIP;  Surgeon: George Balding, MD;  Location: [place];  Service: Orthopedics;  Laterality: Right;  RIGHT HIP EXPLORATION, EXCISION OF DEEP BURSAL [facility] STONE SURGERY  2011   R   KNEE ARTHROSCOPY     Left   SHOULDER ARTHROSCOPY     Left   TOTAL HIP ARTHROPLASTY     Right   TOTAL HIP REVISION     Right   TOTAL KNEE ARTHROPLASTY Right 03/31/2014   Procedure: TOTAL KNEE ARTHROPLASTY;  Surgeon: George Balding, MD;  Location: [place];  Service: Orthopedics;  Laterality: Right;   TOTAL KNEE ARTHROPLASTY Left 08/01/2016   Procedure: TOTAL KNEE ARTHROPLASTY;  Surgeon: George Balding, MD;  Location: [place];  Service: Orthopedics;  Laterality: Left;   WRIST SURGERY     bilateral, plates     Current Outpatient Medications  Medication Sig Dispense Refill   albuterol (PROVENTIL HFA;VENTOLIN HFA) 108 (90 BASE) MCG/ACT inhaler Inhale 2 puffs into the lungs every 6 (six) hours as needed for wheezing or shortness of breath. 1 Inhaler 3   Ascorbic Acid (VITAMIN C) 500 MG tablet Take 1 tablet (500 mg total) by mouth daily. 30 tablet    diclofenac sodium (VOLTAREN) 1 % GEL Apply 4 g topically 3 (three) times daily as needed (Apply to large joint up to 3 times daily as needed). 3 Tube 3   flecainide (TAMBOCOR) 100  MG tablet TAKE 1 TABLET BY MOUTH TWICE DAILY. 60 tablet 7   losartan (COZAAR) 25 MG tablet Take 1 tablet (25 mg total) by mouth daily. 90 tablet 3   metoprolol succinate (TOPROL-XL) 25 MG 24 hr tablet TAKE 1 TABLET ONCE DAILY. 90 tablet 3   potassium chloride Christian (K-DUR,KLOR-CON) 20 MEQ tablet TAKE 1 TABLET ONCE DAILY. 90 tablet 3   No current facility-administered medications for this visit.     Allergies:   Tape, Erythromycin, Sulfa antibiotics, and Tetracyclines & related   Social History:  The patient  reports that he has never smoked. He has never used smokeless tobacco. He reports that he does not drink alcohol or use drugs.   Family  History:  The patient's family history includes Heart attack in his mother; Heart disease in his mother; Suicidality in his mother.   ROS:  Please see the history of present illness.   Otherwise, review of systems is positive for none.   All other systems are reviewed and negative.   PHYSICAL EXAM: VS:  BP (!) 124/58    Pulse (!) 54    Ht 6\' 4"  (1.93 m)    Wt 234 lb 6.4 oz (106.3 kg)    BMI 28.53 kg/m  , BMI Body mass index is 28.53 kg/m. GEN: Well nourished, well developed, in no acute distress  HEENT: normal  Neck: no JVD, carotid bruits, or masses Cardiac: RRR; no murmurs, rubs, or gallops,no edema  Respiratory:  clear to auscultation bilaterally, normal work of breathing GI: soft, nontender, nondistended, + BS MS: no deformity or atrophy  Skin: warm and dry Neuro:  Strength and sensation are intact Psych: euthymic mood, full affect  EKG:  EKG is ordered today. Personal review of the ekg ordered shows sinus rhythm, rate 54, right bundle branch block  Recent Labs: 11/04/2018: ALT 17; BUN 16; Creatinine, Ser 1.00; Hemoglobin 16.7; Platelets 159; Potassium 4.5; Sodium 142    Lipid Panel     Component Value Date/Time   CHOL 164 10/15/2015 0950   TRIG 115 10/15/2015 0950   HDL 47 10/15/2015 0950   CHOLHDL 3.5 10/15/2015 0950   VLDL 23 10/15/2015 0950   LDLCALC 94 10/15/2015 0950     Wt Readings from Last 3 Encounters:  04/15/19 234 lb 6.4 oz (106.3 kg)  03/13/19 235 lb (106.6 kg)  11/04/18 239 lb 3.2 oz (108.5 kg)      Other studies Reviewed: Additional studies/ records that were reviewed today include: TTE 08/26/18  Review of the above records today demonstrates:  - Left ventricle: The cavity size was normal. Wall thickness was   increased in a pattern of mild LVH. Systolic function was   moderately reduced. The estimated ejection fraction was in the   range of 35% to 40%. Diffuse hypokinesis. Doppler parameters are   consistent with abnormal left ventricular  relaxation (grade 1   diastolic dysfunction). Doppler parameters are consistent with   high ventricular filling pressure. - Aortic valve: Transvalvular velocity was within the normal range.   There was no stenosis. There was trivial regurgitation. - Mitral valve: Transvalvular velocity was within the normal range.   There was no evidence for stenosis. There was trivial   regurgitation. - Left atrium: The atrium was mildly dilated. - Right ventricle: The cavity size was normal. Wall thickness was   normal. Systolic function was normal. - Pulmonary arteries: Systolic pressure was within the normal   range. PA peak pressure: 34 mm Hg (S). -  Pericardium, extracardiac: A trivial pericardial effusion was   identified.  Holter 08/30/18 - personally reviewed Sinus rhythm with frequent PVC's (burden ~15%) and infrequent PAC's.  ASSESSMENT AND PLAN:  1.  PVCs: Currently on flecainide.  PVCs appear to be outflow tract in origin.  It appears that his flecainide has decreased his burden.  No changes at this time.  He does have a repeat echo coming up in September.  2.  Hypertension: Currently well controlled  3.  Nonischemic cardiomyopathy: Likely due to elevated PVC burden.  Currently on flecainide.  Repeat echo scheduled for September.  Echo shows a normal ejection fraction, Monzerrath Mcburney continue flecainide, otherwise may need medication changes.   Current medicines are reviewed at length with the patient today.   The patient does not have concerns regarding his medicines.  The following changes were made today: None  Labs/ tests ordered today include:  Orders Placed This Encounter  Procedures   EKG 12-Lead    Disposition:   FU with Nelva Hauk 6 months  Signed, Genell Thede [name], MD  04/15/2019 8:30 AM     [facility] [address] [place] [address] [phone] (office) [phone] (fax)

## 2019-05-13 ENCOUNTER — Ambulatory Visit (HOSPITAL_COMMUNITY): Payer: Medicare Other | Attending: Cardiovascular Disease

## 2019-05-13 ENCOUNTER — Other Ambulatory Visit: Payer: Self-pay

## 2019-05-13 DIAGNOSIS — I428 Other cardiomyopathies: Secondary | ICD-10-CM | POA: Insufficient documentation

## 2019-05-13 MED ORDER — PERFLUTREN LIPID MICROSPHERE
1.0000 mL | INTRAVENOUS | Status: AC | PRN
  Administered 2019-05-13: 2 mL via INTRAVENOUS

## 2019-05-16 ENCOUNTER — Other Ambulatory Visit (HOSPITAL_COMMUNITY): Payer: Medicare Other

## 2019-06-06 DIAGNOSIS — Z23 Encounter for immunization: Secondary | ICD-10-CM | POA: Diagnosis not present

## 2019-07-11 ENCOUNTER — Ambulatory Visit (INDEPENDENT_AMBULATORY_CARE_PROVIDER_SITE_OTHER): Payer: Medicare Other | Admitting: Internal Medicine

## 2019-07-11 ENCOUNTER — Other Ambulatory Visit: Payer: Self-pay

## 2019-07-11 ENCOUNTER — Encounter: Payer: Self-pay | Admitting: Internal Medicine

## 2019-07-11 VITALS — BP 121/74 | HR 55 | Temp 97.2°F | Ht 76.0 in | Wt 235.8 lb

## 2019-07-11 DIAGNOSIS — I452 Bifascicular block: Secondary | ICD-10-CM

## 2019-07-11 DIAGNOSIS — I428 Other cardiomyopathies: Secondary | ICD-10-CM

## 2019-07-11 DIAGNOSIS — I493 Ventricular premature depolarization: Secondary | ICD-10-CM | POA: Diagnosis not present

## 2019-07-11 MED ORDER — ALBUTEROL SULFATE HFA 108 (90 BASE) MCG/ACT IN AERS: 2.0000 | INHALATION_SPRAY | Freq: Four times a day (QID) | RESPIRATORY_TRACT | 0 refills | Status: DC | PRN

## 2019-07-11 NOTE — Patient Instructions (Signed)
Medication Instructions:  Your physician recommends that you continue on your current medications as directed. Please refer to the Current Medication list given to you today.  *If you need a refill on your cardiac medications before your next appointment, please call your pharmacy*  Follow-Up: At [facility], you and your health needs are our priority.  As part of our continuing mission to provide you with exceptional heart care, we have created designated Provider Care Teams.  These Care Teams include your primary Cardiologist (physician) and Advanced Practice Providers (APPs -  Physician Assistants and Nurse Practitioners) who all work together to provide you with the care you need, when you need it.  Your next appointment:   6 month(s)  The format for your next appointment:   In Person  Provider:   K. Chad Hilty, MD  Other Instructions   

## 2019-07-11 NOTE — Progress Notes (Signed)
OFFICE NOTE  Chief Complaint:  Follow-up   Primary Care Physician: Alroy Dust, L.Marlou Sa, MD  HPI:  [name] is a 84 y.o. male who is a former patient of Dr. Mare Ferrari. His wife is also a patient of mine. He has a history of PVCs and is on medicine for suppression of those PVCs and also possible hypertension. Blood pressure initially 155/83 became down to 120/78. He was noted to have a few PVCs today but said he did not take his medicine this morning. Other than that he denies any medical problems. He has a history of multiple orthopedic surgeries and gallbladder surgery in the past. He has a chronic left-sided chest pain which is sharp and he can point to with his finger. This is along the left sternal border and generally does not move, worsened with exertion or is not relieved by rest. He's had 2 heart catheterizations last 5 years, neither study indicated any significant obstructive coronary disease.  11/23/2016  [name] returns today for follow-up. He denies any chest pain or worsening shortness of breath. He does have some PVCs however he is unaware of them. He is on once daily metoprolol tartrate. Blood pressure is at goal today 122/70.  05/31/2017  [name] was seen today in follow-up. He reports 2 episodes of recent chest pain. One episode was associated with stomach upset and pain that radiated up the center of the chest to his neck. He had some associated nausea and diaphoresis. He had a second episode which might of occurred after a spicy meal that was similar although he ended up vomiting after which she felt better. He denied any exercise intolerance, is able to exercise regularly, recently went on a long walking trip vacation and has no symptoms with this activity. He has a history of a false positive stress test in the past to prior cardiac catheterizations in the last 5 years which were negative.  11/14/2017  [name] comes back today for follow-up.  He is without  complaints currently.  He was having atypical chest pain symptoms which I felt more likely GI in etiology.  He ultimately was found to have bile duct stones, although had a history of prior cholecystectomy.  The stones were extracted and he has since improved significantly.  He is in fact off of his PPI medication.  Blood pressures been well controlled.  He is active and denies any chest pain or worsening shortness of breath.  05/20/2018  [name] seen today in follow-up with his wife.  He continues to be without complaints.  Since he had a bile duct stones removed he is had no further chest discomfort.  He denies any shortness of breath or chest pain.  Blood pressure is at goal.  08/16/2018  [name] returns today for follow-up.  He has had some persistent palpitations.  EKG today shows PVCs in a bigeminal pattern.  Blood pressure is well controlled on metoprolol.  He also reports some shortness of breath, making me concerned a little about of whether he has developed any cardiomyopathy related to his PVCs.  The exact burden is not clear.  11/04/2018  [name] returns today for follow-up of his PVCs.  I referred him to Dr. Curt Bears after worsening cardiomyopathy was found to have an EF around 30 to 35%.  He was found also to have a PVC burden of around 15%.  After seeing Dr. Curt Bears, he was placed on flecainide and does feel like he has  had a reduction in his PVCs.  Heart rate is in the 50s today.  Blood pressure is normal.  He is also complaining of nosebleeds.  Also he had had some superficial bleeding of the scrotum, for which she said may have been related to itching and is noted to have conjunctival hemorrhage today which is likely spontaneous.  07/11/2019  [name] returns today for follow-up.  Fortunately has had improvement in his LVEF now up to 50 to 55% with grade 2 diastolic dysfunction and some atrial enlargement by echo in September.  Symptomatically he feels much better.  In fact  he says he is back to normal now.  He maintains on flecainide for PVCs.  EKG today shows sinus bradycardia with first-degree AV block, RBBB at 52.  QTc is 465 ms.  He denies any recurrent bleeding.  He would like a refill of his albuterol.  PMHx:  Past Medical History:  Diagnosis Date  . Arthritis   . Asthma    only if develops cold  . Dysrhythmia    h/o pvc's  . GERD (gastroesophageal reflux disease)   . Kidney stone on left side   . PVC (premature ventricular contraction)   . Recurrent upper respiratory infection (URI) 03/2011    Past Surgical History:  Procedure Laterality Date  . APPENDECTOMY    . BACK SURGERY     x 4  . CARDIAC CATHETERIZATION     july 2010-minor irregl  . CERVICAL SPINE SURGERY     x 2  . CHOLECYSTECTOMY     2008  . ERCP N/A 07/24/2017   Procedure: ENDOSCOPIC RETROGRADE CHOLANGIOPANCREATOGRAPHY (ERCP);  Surgeon: Clarene Essex, MD;  Location: [facility];  Service: Endoscopy;  Laterality: N/A;  . EYE SURGERY     bilat cataract  . INCISION AND DRAINAGE HIP  07/18/2011   Procedure: IRRIGATION AND DEBRIDEMENT HIP;  Surgeon: Garald Balding, MD;  Location: [place];  Service: Orthopedics;  Laterality: Right;  RIGHT HIP EXPLORATION, EXCISION OF DEEP BURSAL [facility]  . KIDNEY STONE SURGERY  2011   R  . KNEE ARTHROSCOPY     Left  . SHOULDER ARTHROSCOPY     Left  . TOTAL HIP ARTHROPLASTY     Right  . TOTAL HIP REVISION     Right  . TOTAL KNEE ARTHROPLASTY Right 03/31/2014   Procedure: TOTAL KNEE ARTHROPLASTY;  Surgeon: Garald Balding, MD;  Location: [place];  Service: Orthopedics;  Laterality: Right;  . TOTAL KNEE ARTHROPLASTY Left 08/01/2016   Procedure: TOTAL KNEE ARTHROPLASTY;  Surgeon: Garald Balding, MD;  Location: [place];  Service: Orthopedics;  Laterality: Left;  . WRIST SURGERY     bilateral, plates    FAMHx:  Family History  Problem Relation Age of Onset  . Heart disease Mother   . Heart attack Mother   . Suicidality Mother   . Anesthesia  problems Neg Hx   . Hypotension Neg Hx   . Malignant hyperthermia Neg Hx   . Pseudochol deficiency Neg Hx     SOCHx:   reports that he has never smoked. He has never used smokeless tobacco. He reports that he does not drink alcohol or use drugs.  ALLERGIES:  Allergies  Allergen Reactions  . Tape Other (See Comments)    Adhesive tape blisters  . Erythromycin Rash  . Sulfa Antibiotics Rash  . Tetracyclines & Related Rash    ROS: Pertinent items noted in HPI and remainder of comprehensive ROS otherwise negative.  HOME MEDS: Current Outpatient Medications  Medication Sig Dispense Refill  . albuterol (PROVENTIL HFA;VENTOLIN HFA) 108 (90 BASE) MCG/ACT inhaler Inhale 2 puffs into the lungs every 6 (six) hours as needed for wheezing or shortness of breath. 1 Inhaler 3  . Ascorbic Acid (VITAMIN C) 500 MG tablet Take 1 tablet (500 mg total) by mouth daily. 30 tablet   . diclofenac sodium (VOLTAREN) 1 % GEL Apply 4 g topically 3 (three) times daily as needed (Apply to large joint up to 3 times daily as needed). 3 Tube 3  . flecainide (TAMBOCOR) 100 MG tablet TAKE 1 TABLET BY MOUTH TWICE DAILY. 60 tablet 7  . losartan (COZAAR) 25 MG tablet Take 1 tablet (25 mg total) by mouth daily. 90 tablet 3  . metoprolol succinate (TOPROL-XL) 25 MG 24 hr tablet TAKE 1 TABLET ONCE DAILY. 90 tablet 3  . potassium chloride SA (K-DUR,KLOR-CON) 20 MEQ tablet TAKE 1 TABLET ONCE DAILY. 90 tablet 3   No current facility-administered medications for this visit.     LABS/IMAGING: No results found for this or any previous visit (from the past 48 hour(s)). No results found.  WEIGHTS: Wt Readings from Last 3 Encounters:  07/11/19 235 lb 12.8 oz (107 kg)  04/15/19 234 lb 6.4 oz (106.3 kg)  03/13/19 235 lb (106.6 kg)    VITALS: BP 121/74   Pulse (!) 55   Temp (!) 97.2 F (36.2 C)   Ht 6\' 4"  (1.93 m)   Wt 235 lb 12.8 oz (107 kg)   SpO2 92%   BMI 28.70 kg/m   EXAM: General appearance: alert and no  distress Neck: no carotid bruit and no JVD Lungs: clear to auscultation bilaterally Heart: regular rate and rhythm, S1, S2 normal, no murmur, click, rub or gallop Abdomen: soft, non-tender; bowel sounds normal; no masses,  no organomegaly Extremities: extremities normal, atraumatic, no cyanosis or edema Pulses: 2+ and symmetric Skin: Skin color, texture, turgor normal. No rashes or lesions Neurologic: Grossly normal Psych: Pleasant  EKG: Sinus bradycardia first-degree AV block 52, RBBB, QTc 465 ms-personally reviewed  ASSESSMENT: 1. Symptomatic bigeminal PVCs - suppressed on flecainide 2. Acute systolic heart failure-suspect PVC mediated, LVEF improved to 50-55% (05/2019) 3. Shortness of breath 4. RBBB 5. Hypertension  PLAN: 1.   [name] has had resolution of his heart failure which is likely PVC mediated.  He is now suppressed on flecainide.  No evidence of PVCs today on his EKG.  His shortness of breath is improved significantly.  Blood pressures well controlled.  I will go ahead and refill albuterol today as a convenience for him however defer to his PCP on this in the future.  Follow-up 6 months.  Pixie Casino, MD, [facility], [place] Director of the Advanced Lipid Disorders &  Cardiovascular Risk Reduction Clinic Diplomate of the American Board of Clinical Lipidology Attending Cardiologist  Direct Dial: [phone]  Fax: [phone]  Website:  [email or web address] 07/11/2019, 9:08 AM

## 2019-09-24 ENCOUNTER — Ambulatory Visit: Payer: Medicare Other

## 2019-09-24 ENCOUNTER — Ambulatory Visit: Payer: Medicare Other | Attending: Internal Medicine

## 2019-09-24 DIAGNOSIS — Z23 Encounter for immunization: Secondary | ICD-10-CM | POA: Insufficient documentation

## 2019-09-29 ENCOUNTER — Other Ambulatory Visit: Payer: Self-pay | Admitting: Cardiology

## 2019-10-15 ENCOUNTER — Ambulatory Visit: Payer: Medicare Other | Attending: Internal Medicine

## 2019-10-15 DIAGNOSIS — Z23 Encounter for immunization: Secondary | ICD-10-CM | POA: Insufficient documentation

## 2019-10-15 NOTE — Progress Notes (Signed)
   Covid-19 Vaccination Clinic  Name:  [name]    MRN: [id number] DOB: [birth year]  10/15/2019  [name] was observed post Covid-19 immunization for 15 minutes without incidence. He was provided with Vaccine Information Sheet and instruction to access the V-Safe system.   [name] was instructed to call 911 with any severe reactions post vaccine: Marland Kitchen Difficulty breathing  . Swelling of your face and throat  . A fast heartbeat  . A bad rash all over your body  . Dizziness and weakness    Immunizations Administered    Name Date Dose VIS Date Route   Pfizer COVID-19 Vaccine 10/15/2019 12:52 PM 0.3 mL 08/15/2019 Intramuscular   Manufacturer: [place]   Lot: [id number]   [place]: [id number]

## 2019-10-20 ENCOUNTER — Other Ambulatory Visit: Payer: Self-pay | Admitting: Internal Medicine

## 2019-10-20 DIAGNOSIS — I119 Hypertensive heart disease without heart failure: Secondary | ICD-10-CM

## 2019-10-21 DIAGNOSIS — Z0000 Encounter for general adult medical examination without abnormal findings: Secondary | ICD-10-CM | POA: Diagnosis not present

## 2019-10-21 DIAGNOSIS — D485 Neoplasm of uncertain behavior of skin: Secondary | ICD-10-CM | POA: Diagnosis not present

## 2019-10-21 DIAGNOSIS — I429 Cardiomyopathy, unspecified: Secondary | ICD-10-CM | POA: Diagnosis not present

## 2019-10-21 DIAGNOSIS — G5601 Carpal tunnel syndrome, right upper limb: Secondary | ICD-10-CM | POA: Diagnosis not present

## 2019-10-21 DIAGNOSIS — I493 Ventricular premature depolarization: Secondary | ICD-10-CM | POA: Diagnosis not present

## 2019-11-21 DIAGNOSIS — R739 Hyperglycemia, unspecified: Secondary | ICD-10-CM | POA: Diagnosis not present

## 2019-12-18 ENCOUNTER — Encounter: Payer: Self-pay | Admitting: Cardiology

## 2019-12-18 ENCOUNTER — Ambulatory Visit (INDEPENDENT_AMBULATORY_CARE_PROVIDER_SITE_OTHER): Payer: Medicare Other | Admitting: Cardiology

## 2019-12-18 ENCOUNTER — Other Ambulatory Visit: Payer: Self-pay

## 2019-12-18 VITALS — BP 124/68 | HR 57 | Ht 76.0 in | Wt 239.0 lb

## 2019-12-18 DIAGNOSIS — I493 Ventricular premature depolarization: Secondary | ICD-10-CM

## 2019-12-18 NOTE — Progress Notes (Signed)
Electrophysiology Office Note   Date:  12/18/2019   ID:  [name], DOB September 14, 1934, MRN [id number]  PCP:  [name].George Sa, MD  Cardiologist:  [name] Primary Electrophysiologist:  George Paredez [name], MD    No chief complaint on file.    History of Present Illness: [name] is a 85 y.o. male who is being seen today for the evaluation of PVCs at the request of [name], L.George Sa, MD. Presenting today for electrophysiology evaluation.  He has a history of PVCs.  He presented to his primary cardiologist with persistent palpitations and was noted to be in ventricular bigeminy.  He had a transthoracic echo that showed a decreased ejection fraction of 35 to 40%.  Holter monitoring showed 15% PVCs.  On flecainide with a decrease in his PVC burden.  His ejection fraction has since normalized.  Today, denies symptoms of palpitations, chest pain, shortness of breath, orthopnea, PND, lower extremity edema, claudication, dizziness, presyncope, syncope, bleeding, or neurologic sequela. The patient is tolerating medications without difficulties.  He feels well better since starting flecainide.  He has less fatigue and weakness.  He did not ever have shortness of breath.  He is able to do all of his daily activities.  He is playing golf twice this week, where he feels that he would not have been able to do that prior to suppression of his PVCs.   Past Medical History:  Diagnosis Date  . Arthritis   . Asthma    only if develops cold  . Dysrhythmia    h/o pvc's  . GERD (gastroesophageal reflux disease)   . Kidney stone on left side   . PVC (premature ventricular contraction)   . Recurrent upper respiratory infection (URI) 03/2011   Past Surgical History:  Procedure Laterality Date  . APPENDECTOMY    . BACK SURGERY     x 4  . CARDIAC CATHETERIZATION     july 2010-minor irregl  . CERVICAL SPINE SURGERY     x 2  . CHOLECYSTECTOMY     2008  . ERCP N/A 07/24/2017   Procedure:  ENDOSCOPIC RETROGRADE CHOLANGIOPANCREATOGRAPHY (ERCP);  Surgeon: George Essex, MD;  Location: [facility];  Service: Endoscopy;  Laterality: N/A;  . EYE SURGERY     bilat cataract  . INCISION AND DRAINAGE HIP  07/18/2011   Procedure: IRRIGATION AND DEBRIDEMENT HIP;  Surgeon: George Balding, MD;  Location: [place];  Service: Orthopedics;  Laterality: Right;  RIGHT HIP EXPLORATION, EXCISION OF DEEP BURSAL SAC  . KIDNEY STONE SURGERY  2011   R  . KNEE ARTHROSCOPY     Left  . SHOULDER ARTHROSCOPY     Left  . TOTAL HIP ARTHROPLASTY     Right  . TOTAL HIP REVISION     Right  . TOTAL KNEE ARTHROPLASTY Right 03/31/2014   Procedure: TOTAL KNEE ARTHROPLASTY;  Surgeon: George Balding, MD;  Location: [facility];  Service: Orthopedics;  Laterality: Right;  . TOTAL KNEE ARTHROPLASTY Left 08/01/2016   Procedure: TOTAL KNEE ARTHROPLASTY;  Surgeon: George Balding, MD;  Location: [place];  Service: Orthopedics;  Laterality: Left;  . WRIST SURGERY     bilateral, plates     Current Outpatient Medications  Medication Sig Dispense Refill  . albuterol (VENTOLIN HFA) 108 (90 Base) MCG/ACT inhaler Inhale 2 puffs into the lungs every 6 (six) hours as needed for wheezing or shortness of breath. Future refills from PCP. 1 g 0  . Ascorbic Acid (VITAMIN C)  500 MG tablet Take 1 tablet (500 mg total) by mouth daily. 30 tablet   . diclofenac sodium (VOLTAREN) 1 % GEL Apply 4 g topically 3 (three) times daily as needed (Apply to large joint up to 3 times daily as needed). 3 Tube 3  . flecainide (TAMBOCOR) 100 MG tablet TAKE 1 TABLET BY MOUTH TWICE DAILY. 60 tablet 8  . losartan (COZAAR) 25 MG tablet TAKE 1 TABLET ONCE DAILY. 90 tablet 0  . metoprolol succinate (TOPROL-XL) 25 MG 24 hr tablet TAKE 1 TABLET ONCE DAILY. 90 tablet 0  . potassium chloride Christian (KLOR-CON) 20 MEQ tablet TAKE 1 TABLET ONCE DAILY. 90 tablet 0   No current facility-administered medications for this visit.    Allergies:   Tape, Erythromycin,  Sulfa antibiotics, and Tetracyclines & related   Social History:  The patient  reports that he has never smoked. He has never used smokeless tobacco. He reports that he does not drink alcohol or use drugs.   Family History:  The patient's family history includes Heart attack in his mother; Heart disease in his mother; Suicidality in his mother.   ROS:  Please see the history of present illness.   Otherwise, review of systems is positive for none.   All other systems are reviewed and negative.   PHYSICAL EXAM: VS:  BP 124/68   Pulse (!) 57   Ht 6\' 4"  (1.93 m)   Wt 239 lb (108.4 kg)   SpO2 94%   BMI 29.09 kg/m  , BMI Body mass index is 29.09 kg/m. GEN: Well nourished, well developed, in no acute distress  HEENT: normal  Neck: no JVD, carotid bruits, or masses Cardiac: RRR; no murmurs, rubs, or gallops,no edema  Respiratory:  clear to auscultation bilaterally, normal work of breathing GI: soft, nontender, nondistended, + BS MS: no deformity or atrophy  Skin: warm and dry Neuro:  Strength and sensation are intact Psych: euthymic mood, full affect  EKG:  EKG is ordered today. Personal review of the ekg ordered shows sinus rhythm, rate 57, right bundle branch block, PVCs, first-degree AV block  Recent Labs: No results found for requested labs within last 8760 hours.    Lipid Panel     Component Value Date/Time   CHOL 164 10/15/2015 0950   TRIG 115 10/15/2015 0950   HDL 47 10/15/2015 0950   CHOLHDL 3.5 10/15/2015 0950   VLDL 23 10/15/2015 0950   LDLCALC 94 10/15/2015 0950     Wt Readings from Last 3 Encounters:  12/18/19 239 lb (108.4 kg)  07/11/19 235 lb 12.8 oz (107 kg)  04/15/19 234 lb 6.4 oz (106.3 kg)      Other studies Reviewed: Additional studies/ records that were reviewed today include: TTE 08/26/18  Review of the above records today demonstrates:  1. The left ventricle has low normal systolic function, with an ejection  fraction of 50-55%. The cavity size  was normal. There is mildly increased  left ventricular wall thickness. Left ventricular diastolic Doppler  parameters are consistent with  pseudonormalization.  2. The right ventricle has normal systolic function. The cavity was  normal. There is no increase in right ventricular wall thickness.  3. Left atrial size was moderately dilated.  4. The aortic valve is grossly normal. Mild thickening of the aortic  valve. Mild calcification of the aortic valve. No stenosis of the aortic  valve.  5. The aorta is normal unless otherwise noted.  6. The aortic root and ascending aorta are  normal in size and structure.  7. The atrial septum is grossly normal.   Holter 08/30/18 - personally reviewed Sinus rhythm with frequent PVC's (burden ~15%) and infrequent PAC's.  ASSESSMENT AND PLAN:  1.  PVCs: Currently on flecainide.  PVCs are outflow tract in origin.  He has had a decreased burden.  No changes.  He does have a right bundle branch block which is chronic and mainly unchanged since initiation of flecainide.  2.  Hypertension: Currently well controlled  3.  Nonischemic cardiomyopathy: Likely due to elevated PVC burden.  Currently on flecainide.  Repeat echo shows normalization of his ejection fraction.  No changes.  Current medicines are reviewed at length with the patient today.   The patient does not have concerns regarding his medicines.  The following changes were made today: None  Labs/ tests ordered today include:  Orders Placed This Encounter  Procedures  . EKG 12-Lead    Disposition:   FU with Nachum Derossett 6 months  Signed, Avontae Burkhead [name], MD  12/18/2019 3:25 PM     [facility] [address] [place] [address] [phone] (office) [phone] (fax)

## 2020-01-16 ENCOUNTER — Other Ambulatory Visit: Payer: Self-pay

## 2020-01-16 ENCOUNTER — Encounter: Payer: Self-pay | Admitting: Internal Medicine

## 2020-01-16 ENCOUNTER — Ambulatory Visit (INDEPENDENT_AMBULATORY_CARE_PROVIDER_SITE_OTHER): Payer: Medicare Other | Admitting: Internal Medicine

## 2020-01-16 VITALS — BP 119/65 | HR 55 | Temp 97.1°F | Ht 76.0 in | Wt 236.6 lb

## 2020-01-16 DIAGNOSIS — Z79899 Other long term (current) drug therapy: Secondary | ICD-10-CM

## 2020-01-16 DIAGNOSIS — Z5181 Encounter for therapeutic drug level monitoring: Secondary | ICD-10-CM

## 2020-01-16 DIAGNOSIS — I493 Ventricular premature depolarization: Secondary | ICD-10-CM

## 2020-01-16 DIAGNOSIS — I428 Other cardiomyopathies: Secondary | ICD-10-CM | POA: Diagnosis not present

## 2020-01-16 DIAGNOSIS — I452 Bifascicular block: Secondary | ICD-10-CM | POA: Diagnosis not present

## 2020-01-16 NOTE — Patient Instructions (Signed)

## 2020-01-16 NOTE — Progress Notes (Signed)
OFFICE NOTE  Chief Complaint:  Follow-up   Primary Care Physician: Alroy Dust, L.Marlou Sa, MD  HPI:  [name] is a 85 y.o. male who is a former patient of Dr. Mare Ferrari. His wife is also a patient of mine. He has a history of PVCs and is on medicine for suppression of those PVCs and also possible hypertension. Blood pressure initially 155/83 became down to 120/78. He was noted to have a few PVCs today but said he did not take his medicine this morning. Other than that he denies any medical problems. He has a history of multiple orthopedic surgeries and gallbladder surgery in the past. He has a chronic left-sided chest pain which is sharp and he can point to with his finger. This is along the left sternal border and generally does not move, worsened with exertion or is not relieved by rest. He's had 2 heart catheterizations last 5 years, neither study indicated any significant obstructive coronary disease.  11/23/2016  [name] returns today for follow-up. He denies any chest pain or worsening shortness of breath. He does have some PVCs however he is unaware of them. He is on once daily metoprolol tartrate. Blood pressure is at goal today 122/70.  05/31/2017  [name] was seen today in follow-up. He reports 2 episodes of recent chest pain. One episode was associated with stomach upset and pain that radiated up the center of the chest to his neck. He had some associated nausea and diaphoresis. He had a second episode which might of occurred after a spicy meal that was similar although he ended up vomiting after which she felt better. He denied any exercise intolerance, is able to exercise regularly, recently went on a long walking trip vacation and has no symptoms with this activity. He has a history of a false positive stress test in the past to prior cardiac catheterizations in the last 5 years which were negative.  11/14/2017  [name] comes back today for follow-up.  He is without  complaints currently.  He was having atypical chest pain symptoms which I felt more likely GI in etiology.  He ultimately was found to have bile duct stones, although had a history of prior cholecystectomy.  The stones were extracted and he has since improved significantly.  He is in fact off of his PPI medication.  Blood pressures been well controlled.  He is active and denies any chest pain or worsening shortness of breath.  05/20/2018  [name] seen today in follow-up with his wife.  He continues to be without complaints.  Since he had a bile duct stones removed he is had no further chest discomfort.  He denies any shortness of breath or chest pain.  Blood pressure is at goal.  08/16/2018  [name] returns today for follow-up.  He has had some persistent palpitations.  EKG today shows PVCs in a bigeminal pattern.  Blood pressure is well controlled on metoprolol.  He also reports some shortness of breath, making me concerned a little about of whether he has developed any cardiomyopathy related to his PVCs.  The exact burden is not clear.  11/04/2018  [name] returns today for follow-up of his PVCs.  I referred him to Dr. Curt Bears after worsening cardiomyopathy was found to have an EF around 30 to 35%.  He was found also to have a PVC burden of around 15%.  After seeing Dr. Curt Bears, he was placed on flecainide and does feel like he has  had a reduction in his PVCs.  Heart rate is in the 50s today.  Blood pressure is normal.  He is also complaining of nosebleeds.  Also he had had some superficial bleeding of the scrotum, for which she said may have been related to itching and is noted to have conjunctival hemorrhage today which is likely spontaneous.  07/11/2019  [name] returns today for follow-up.  Fortunately has had improvement in his LVEF now up to 50 to 55% with grade 2 diastolic dysfunction and some atrial enlargement by echo in September.  Symptomatically he feels much better.  In fact  he says he is back to normal now.  He maintains on flecainide for PVCs.  EKG today shows sinus bradycardia with first-degree AV block, RBBB at 52.  QTc is 465 ms.  He denies any recurrent bleeding.  He would like a refill of his albuterol.  01/16/2020  [name] is seen today in follow-up.  He recently saw Dr. Curt Bears for further evaluation of his PVCs.  They seem to be well suppressed on flecainide.  His EF has improved.  QTC today is 445 ms.  He has a right bundle branch block and first-degree AV block.  He is asymptomatic denying chest pain or worsening shortness of breath.  Blood pressure is at goal.  PMHx:  Past Medical History:  Diagnosis Date  . Arthritis   . Asthma    only if develops cold  . Dysrhythmia    h/o pvc's  . GERD (gastroesophageal reflux disease)   . Kidney stone on left side   . PVC (premature ventricular contraction)   . Recurrent upper respiratory infection (URI) 03/2011    Past Surgical History:  Procedure Laterality Date  . APPENDECTOMY    . BACK SURGERY     x 4  . CARDIAC CATHETERIZATION     july 2010-minor irregl  . CERVICAL SPINE SURGERY     x 2  . CHOLECYSTECTOMY     2008  . ERCP N/A 07/24/2017   Procedure: ENDOSCOPIC RETROGRADE CHOLANGIOPANCREATOGRAPHY (ERCP);  Surgeon: Clarene Essex, MD;  Location: [facility];  Service: Endoscopy;  Laterality: N/A;  . EYE SURGERY     bilat cataract  . INCISION AND DRAINAGE HIP  07/18/2011   Procedure: IRRIGATION AND DEBRIDEMENT HIP;  Surgeon: Garald Balding, MD;  Location: [facility];  Service: Orthopedics;  Laterality: Right;  RIGHT HIP EXPLORATION, EXCISION OF DEEP BURSAL SAC  . KIDNEY STONE SURGERY  2011   R  . KNEE ARTHROSCOPY     Left  . SHOULDER ARTHROSCOPY     Left  . TOTAL HIP ARTHROPLASTY     Right  . TOTAL HIP REVISION     Right  . TOTAL KNEE ARTHROPLASTY Right 03/31/2014   Procedure: TOTAL KNEE ARTHROPLASTY;  Surgeon: Garald Balding, MD;  Location: [facility];  Service: Orthopedics;  Laterality:  Right;  . TOTAL KNEE ARTHROPLASTY Left 08/01/2016   Procedure: TOTAL KNEE ARTHROPLASTY;  Surgeon: Garald Balding, MD;  Location: [facility];  Service: Orthopedics;  Laterality: Left;  . WRIST SURGERY     bilateral, plates    FAMHx:  Family History  Problem Relation Age of Onset  . Heart disease Mother   . Heart attack Mother   . Suicidality Mother   . Anesthesia problems Neg Hx   . Hypotension Neg Hx   . Malignant hyperthermia Neg Hx   . Pseudochol deficiency Neg Hx     SOCHx:   reports that  he has never smoked. He has never used smokeless tobacco. He reports that he does not drink alcohol or use drugs.  ALLERGIES:  Allergies  Allergen Reactions  . Tape Other (See Comments)    Adhesive tape blisters  . Erythromycin Rash  . Sulfa Antibiotics Rash  . Tetracyclines & Related Rash    ROS: Pertinent items noted in HPI and remainder of comprehensive ROS otherwise negative.  HOME MEDS: Current Outpatient Medications  Medication Sig Dispense Refill  . albuterol (VENTOLIN HFA) 108 (90 Base) MCG/ACT inhaler Inhale 2 puffs into the lungs every 6 (six) hours as needed for wheezing or shortness of breath. Future refills from PCP. 1 g 0  . Ascorbic Acid (VITAMIN C) 500 MG tablet Take 1 tablet (500 mg total) by mouth daily. 30 tablet   . diclofenac sodium (VOLTAREN) 1 % GEL Apply 4 g topically 3 (three) times daily as needed (Apply to large joint up to 3 times daily as needed). 3 Tube 3  . flecainide (TAMBOCOR) 100 MG tablet TAKE 1 TABLET BY MOUTH TWICE DAILY. 60 tablet 8  . losartan (COZAAR) 25 MG tablet TAKE 1 TABLET ONCE DAILY. 90 tablet 0  . metoprolol succinate (TOPROL-XL) 25 MG 24 hr tablet TAKE 1 TABLET ONCE DAILY. 90 tablet 0  . potassium chloride SA (KLOR-CON) 20 MEQ tablet TAKE 1 TABLET ONCE DAILY. 90 tablet 0   No current facility-administered medications for this visit.    LABS/IMAGING: No results found for this or any previous visit (from the past 48 hour(s)). No  results found.  WEIGHTS: Wt Readings from Last 3 Encounters:  01/16/20 236 lb 9.6 oz (107.3 kg)  12/18/19 239 lb (108.4 kg)  07/11/19 235 lb 12.8 oz (107 kg)    VITALS: BP 119/65   Pulse (!) 55   Temp (!) 97.1 F (36.2 C)   Ht 6\' 4"  (1.93 m)   Wt 236 lb 9.6 oz (107.3 kg)   SpO2 96%   BMI 28.80 kg/m   EXAM: General appearance: alert and no distress Neck: no carotid bruit and no JVD Lungs: clear to auscultation bilaterally Heart: regular rate and rhythm, S1, S2 normal, no murmur, click, rub or gallop Abdomen: soft, non-tender; bowel sounds normal; no masses,  no organomegaly Extremities: extremities normal, atraumatic, no cyanosis or edema Pulses: 2+ and symmetric Skin: Skin color, texture, turgor normal. No rashes or lesions Neurologic: Grossly normal Psych: Pleasant  EKG: Sinus bradycardia first-degree AV block, RBBB at 55, QTc 4 and 45 ms-personally reviewed  ASSESSMENT: 1. Symptomatic bigeminal PVCs - suppressed on flecainide 2. Acute systolic heart failure-suspect PVC mediated, LVEF improved to 50-55% (05/2019) 3. Shortness of breath 4. RBBB 5. Hypertension  PLAN: 1.   [name] continues to do well and has had suppression of his PVCs.  His heart failure has improved.  Blood pressure is well controlled today.  He is asymptomatic.  No changes in his medicines.  Follow-up 6 months.  Pixie Casino, MD, [facility], [place] Director of the [facility]  [facility] Diplomate of the American Board of Clinical Lipidology Attending Cardiologist  Direct Dial: [phone]  Fax: [phone]  Website:  [email or web address] 01/16/2020, 2:04 PM

## 2020-01-25 ENCOUNTER — Other Ambulatory Visit: Payer: Self-pay | Admitting: Internal Medicine

## 2020-01-25 DIAGNOSIS — I119 Hypertensive heart disease without heart failure: Secondary | ICD-10-CM

## 2020-01-28 ENCOUNTER — Other Ambulatory Visit: Payer: Self-pay

## 2020-01-28 DIAGNOSIS — I119 Hypertensive heart disease without heart failure: Secondary | ICD-10-CM

## 2020-01-28 MED ORDER — POTASSIUM CHLORIDE CRYS ER 20 MEQ PO TBCR: 20.0000 meq | EXTENDED_RELEASE_TABLET | Freq: Every day | ORAL | 3 refills | Status: DC

## 2020-01-28 MED ORDER — LOSARTAN POTASSIUM 25 MG PO TABS: 25.0000 mg | ORAL_TABLET | Freq: Every day | ORAL | 3 refills | Status: DC

## 2020-01-28 MED ORDER — METOPROLOL SUCCINATE ER 25 MG PO TB24: 25.0000 mg | ORAL_TABLET | Freq: Every day | ORAL | 3 refills | Status: DC

## 2020-06-21 ENCOUNTER — Ambulatory Visit: Payer: Medicare Other | Admitting: Cardiology

## 2020-06-22 ENCOUNTER — Ambulatory Visit (INDEPENDENT_AMBULATORY_CARE_PROVIDER_SITE_OTHER): Payer: Medicare Other | Admitting: Cardiology

## 2020-06-22 ENCOUNTER — Other Ambulatory Visit: Payer: Self-pay

## 2020-06-22 ENCOUNTER — Encounter: Payer: Self-pay | Admitting: Cardiology

## 2020-06-22 VITALS — BP 104/64 | HR 54 | Ht 76.0 in | Wt 238.6 lb

## 2020-06-22 DIAGNOSIS — I493 Ventricular premature depolarization: Secondary | ICD-10-CM | POA: Diagnosis not present

## 2020-06-22 NOTE — Progress Notes (Signed)
Electrophysiology Office Note   Date:  06/22/2020   ID:  [name], DOB 03/14/1935, MRN [id number]  PCP:  Aurea Graff.Marlou Sa, MD  Cardiologist:  Debara Pickett Primary Electrophysiologist:  Tavonte Seybold [name], MD    No chief complaint on file.    History of Present Illness: [name] is a 85 y.o. male who is being seen today for the evaluation of PVCs at the request of Alroy Dust, L.Marlou Sa, MD. Presenting today for electrophysiology evaluation.    He has a history of PVCs and CHF.  His echo showed an ejection fraction of 35 to 40%.  Holter monitor showed 15% PVCs.  He was put on flecainide with a decrease in his PVC burden.  Repeat echo shows a normalization of his ejection fraction.  Today, denies symptoms of palpitations, chest pain, shortness of breath, orthopnea, PND, lower extremity edema, claudication, dizziness, presyncope, syncope, bleeding, or neurologic sequela. The patient is tolerating medications without difficulties.  Since last being seen he has done well.  He has no chest pain or shortness of breath.  Is able to do all his daily activities.  He has been playing golf through the summer not having any issues with exercise.  Past Medical History:  Diagnosis Date  . Arthritis   . Asthma    only if develops cold  . Dysrhythmia    h/o pvc's  . GERD (gastroesophageal reflux disease)   . Kidney stone on left side   . PVC (premature ventricular contraction)   . Recurrent upper respiratory infection (URI) 03/2011   Past Surgical History:  Procedure Laterality Date  . APPENDECTOMY    . BACK SURGERY     x 4  . CARDIAC CATHETERIZATION     july 2010-minor irregl  . CERVICAL SPINE SURGERY     x 2  . CHOLECYSTECTOMY     2008  . ERCP N/A 07/24/2017   Procedure: ENDOSCOPIC RETROGRADE CHOLANGIOPANCREATOGRAPHY (ERCP);  Surgeon: Clarene Essex, MD;  Location: [facility];  Service: Endoscopy;  Laterality: N/A;  . EYE SURGERY     bilat cataract  . INCISION AND DRAINAGE HIP   07/18/2011   Procedure: IRRIGATION AND DEBRIDEMENT HIP;  Surgeon: Garald Balding, MD;  Location: [facility];  Service: Orthopedics;  Laterality: Right;  RIGHT HIP EXPLORATION, EXCISION OF DEEP BURSAL SAC  . KIDNEY STONE SURGERY  2011   R  . KNEE ARTHROSCOPY     Left  . SHOULDER ARTHROSCOPY     Left  . TOTAL HIP ARTHROPLASTY     Right  . TOTAL HIP REVISION     Right  . TOTAL KNEE ARTHROPLASTY Right 03/31/2014   Procedure: TOTAL KNEE ARTHROPLASTY;  Surgeon: Garald Balding, MD;  Location: [facility];  Service: Orthopedics;  Laterality: Right;  . TOTAL KNEE ARTHROPLASTY Left 08/01/2016   Procedure: TOTAL KNEE ARTHROPLASTY;  Surgeon: Garald Balding, MD;  Location: [facility];  Service: Orthopedics;  Laterality: Left;  . WRIST SURGERY     bilateral, plates     Current Outpatient Medications  Medication Sig Dispense Refill  . albuterol (VENTOLIN HFA) 108 (90 Base) MCG/ACT inhaler Inhale 2 puffs into the lungs every 6 (six) hours as needed for wheezing or shortness of breath. Future refills from PCP. 1 g 0  . Ascorbic Acid (VITAMIN C) 500 MG tablet Take 1 tablet (500 mg total) by mouth daily. 30 tablet   . diclofenac sodium (VOLTAREN) 1 % GEL Apply 4 g topically 3 (three) times daily as needed (  Apply to large joint up to 3 times daily as needed). 3 Tube 3  . flecainide (TAMBOCOR) 100 MG tablet TAKE 1 TABLET BY MOUTH TWICE DAILY. 60 tablet 8  . losartan (COZAAR) 25 MG tablet Take 1 tablet (25 mg total) by mouth daily. 90 tablet 3  . metoprolol succinate (TOPROL-XL) 25 MG 24 hr tablet Take 1 tablet (25 mg total) by mouth daily. 90 tablet 3  . potassium chloride SA (KLOR-CON) 20 MEQ tablet Take 1 tablet (20 mEq total) by mouth daily. 90 tablet 3   No current facility-administered medications for this visit.    Allergies:   Tape, Erythromycin, Sulfa antibiotics, and Tetracyclines & related   Social History:  The patient  reports that he has never smoked. He has never used smokeless tobacco. He  reports that he does not drink alcohol and does not use drugs.   Family History:  The patient's family history includes Heart attack in his mother; Heart disease in his mother; Suicidality in his mother.   ROS:  Please see the history of present illness.   Otherwise, review of systems is positive for none.   All other systems are reviewed and negative.   PHYSICAL EXAM: VS:  BP 104/64   Pulse (!) 54   Ht 6\' 4"  (1.93 m)   Wt 238 lb 9.6 oz (108.2 kg)   SpO2 94%   BMI 29.04 kg/m  , BMI Body mass index is 29.04 kg/m. GEN: Well nourished, well developed, in no acute distress  HEENT: normal  Neck: no JVD, carotid bruits, or masses Cardiac: RRR; no murmurs, rubs, or gallops,no edema  Respiratory:  clear to auscultation bilaterally, normal work of breathing GI: soft, nontender, nondistended, + BS MS: no deformity or atrophy  Skin: warm and dry Neuro:  Strength and sensation are intact Psych: euthymic mood, full affect  EKG:  EKG is ordered today. Personal review of the ekg ordered shows sinus rhythm, PVC, rate  Recent Labs: No results found for requested labs within last 8760 hours.    Lipid Panel     Component Value Date/Time   CHOL 164 10/15/2015 0950   TRIG 115 10/15/2015 0950   HDL 47 10/15/2015 0950   CHOLHDL 3.5 10/15/2015 0950   VLDL 23 10/15/2015 0950   LDLCALC 94 10/15/2015 0950     Wt Readings from Last 3 Encounters:  06/22/20 238 lb 9.6 oz (108.2 kg)  01/16/20 236 lb 9.6 oz (107.3 kg)  12/18/19 239 lb (108.4 kg)      Other studies Reviewed: Additional studies/ records that were reviewed today include: TTE 08/26/18  Review of the above records today demonstrates:  1. The left ventricle has low normal systolic function, with an ejection  fraction of 50-55%. The cavity size was normal. There is mildly increased  left ventricular wall thickness. Left ventricular diastolic Doppler  parameters are consistent with  pseudonormalization.  2. The right ventricle  has normal systolic function. The cavity was  normal. There is no increase in right ventricular wall thickness.  3. Left atrial size was moderately dilated.  4. The aortic valve is grossly normal. Mild thickening of the aortic  valve. Mild calcification of the aortic valve. No stenosis of the aortic  valve.  5. The aorta is normal unless otherwise noted.  6. The aortic root and ascending aorta are normal in size and structure.  7. The atrial septum is grossly normal.   Holter 08/30/18 - personally reviewed Sinus rhythm with frequent PVC's (  burden ~15%) and infrequent PAC's.  ASSESSMENT AND PLAN:  1.  PVCs: Currently on flecainide (high risk medication.  PVCs outflow tract origin.  He has a right bundle branch block which is unchanged since initiation of flecainide.  Continues to have minimal PVCs.  2.  Hypertension: Currently well controlled  3.  Nonischemic cardiomyopathy: Likely due to elevated PVC burden.  Currently on flecainide.  Repeat echo shows normalization of his ejection fraction.    Current medicines are reviewed at length with the patient today.   The patient does not have concerns regarding his medicines.  The following changes were made today: None  Labs/ tests ordered today include:  Orders Placed This Encounter  Procedures  . EKG 12-Lead    Disposition:   FU with Khalifa Knecht 6 months  Signed, Saundra Gin [name], MD  06/22/2020 3:43 PM     [facility] [address] [place] [address] [phone] (office) [phone] (fax)

## 2020-07-07 ENCOUNTER — Other Ambulatory Visit: Payer: Self-pay | Admitting: Cardiology

## 2020-07-14 ENCOUNTER — Encounter: Payer: Self-pay | Admitting: Internal Medicine

## 2020-07-14 ENCOUNTER — Ambulatory Visit (INDEPENDENT_AMBULATORY_CARE_PROVIDER_SITE_OTHER): Payer: Medicare Other | Admitting: Internal Medicine

## 2020-07-14 VITALS — BP 122/70 | HR 55 | Ht 76.0 in | Wt 237.6 lb

## 2020-07-14 DIAGNOSIS — I428 Other cardiomyopathies: Secondary | ICD-10-CM

## 2020-07-14 DIAGNOSIS — I493 Ventricular premature depolarization: Secondary | ICD-10-CM

## 2020-07-14 DIAGNOSIS — Z79899 Other long term (current) drug therapy: Secondary | ICD-10-CM | POA: Diagnosis not present

## 2020-07-14 DIAGNOSIS — Z5181 Encounter for therapeutic drug level monitoring: Secondary | ICD-10-CM

## 2020-07-14 DIAGNOSIS — I452 Bifascicular block: Secondary | ICD-10-CM | POA: Diagnosis not present

## 2020-07-14 NOTE — Progress Notes (Signed)
OFFICE NOTE  Chief Complaint:  Follow-up   Primary Care Physician: Alroy Dust, L.Marlou Sa, MD  HPI:  [name] is a 85 y.o. male who is a former patient of Dr. Mare Ferrari. His wife is also a patient of mine. He has a history of PVCs and is on medicine for suppression of those PVCs and also possible hypertension. Blood pressure initially 155/83 became down to 120/78. He was noted to have a few PVCs today but said he did not take his medicine this morning. Other than that he denies any medical problems. He has a history of multiple orthopedic surgeries and gallbladder surgery in the past. He has a chronic left-sided chest pain which is sharp and he can point to with his finger. This is along the left sternal border and generally does not move, worsened with exertion or is not relieved by rest. He's had 2 heart catheterizations last 5 years, neither study indicated any significant obstructive coronary disease.  11/23/2016  [name] returns today for follow-up. He denies any chest pain or worsening shortness of breath. He does have some PVCs however he is unaware of them. He is on once daily metoprolol tartrate. Blood pressure is at goal today 122/70.  05/31/2017  [name] was seen today in follow-up. He reports 2 episodes of recent chest pain. One episode was associated with stomach upset and pain that radiated up the center of the chest to his neck. He had some associated nausea and diaphoresis. He had a second episode which might of occurred after a spicy meal that was similar although he ended up vomiting after which she felt better. He denied any exercise intolerance, is able to exercise regularly, recently went on a long walking trip vacation and has no symptoms with this activity. He has a history of a false positive stress test in the past to prior cardiac catheterizations in the last 5 years which were negative.  11/14/2017  [name] comes back today for follow-up.  He is without  complaints currently.  He was having atypical chest pain symptoms which I felt more likely GI in etiology.  He ultimately was found to have bile duct stones, although had a history of prior cholecystectomy.  The stones were extracted and he has since improved significantly.  He is in fact off of his PPI medication.  Blood pressures been well controlled.  He is active and denies any chest pain or worsening shortness of breath.  05/20/2018  [name] seen today in follow-up with his wife.  He continues to be without complaints.  Since he had a bile duct stones removed he is had no further chest discomfort.  He denies any shortness of breath or chest pain.  Blood pressure is at goal.  08/16/2018  [name] returns today for follow-up.  He has had some persistent palpitations.  EKG today shows PVCs in a bigeminal pattern.  Blood pressure is well controlled on metoprolol.  He also reports some shortness of breath, making me concerned a little about of whether he has developed any cardiomyopathy related to his PVCs.  The exact burden is not clear.  11/04/2018  [name] returns today for follow-up of his PVCs.  I referred him to Dr. Curt Bears after worsening cardiomyopathy was found to have an EF around 30 to 35%.  He was found also to have a PVC burden of around 15%.  After seeing Dr. Curt Bears, he was placed on flecainide and does feel like he has  had a reduction in his PVCs.  Heart rate is in the 50s today.  Blood pressure is normal.  He is also complaining of nosebleeds.  Also he had had some superficial bleeding of the scrotum, for which she said may have been related to itching and is noted to have conjunctival hemorrhage today which is likely spontaneous.  07/11/2019  [name] returns today for follow-up.  Fortunately has had improvement in his LVEF now up to 50 to 55% with grade 2 diastolic dysfunction and some atrial enlargement by echo in September.  Symptomatically he feels much better.  In fact  he says he is back to normal now.  He maintains on flecainide for PVCs.  EKG today shows sinus bradycardia with first-degree AV block, RBBB at 52.  QTc is 465 ms.  He denies any recurrent bleeding.  He would like a refill of his albuterol.  01/16/2020  [name] is seen today in follow-up.  He recently saw Dr. Curt Bears for further evaluation of his PVCs.  They seem to be well suppressed on flecainide.  His EF has improved.  QTC today is 445 ms.  He has a right bundle branch block and first-degree AV block.  He is asymptomatic denying chest pain or worsening shortness of breath.  Blood pressure is at goal.  07/14/2020  [name] returns today for follow-up.  Recently saw Dr. Curt Bears again and seems to have had good suppression over his PVCs.  EKG has been stable with no evidence of PVCs today and a right bundle branch block at rate of 55.  Blood pressures well controlled.  He is asymptomatic.  PMHx:  Past Medical History:  Diagnosis Date  . Arthritis   . Asthma    only if develops cold  . Dysrhythmia    h/o pvc's  . GERD (gastroesophageal reflux disease)   . Kidney stone on left side   . PVC (premature ventricular contraction)   . Recurrent upper respiratory infection (URI) 03/2011    Past Surgical History:  Procedure Laterality Date  . APPENDECTOMY    . BACK SURGERY     x 4  . CARDIAC CATHETERIZATION     july 2010-minor irregl  . CERVICAL SPINE SURGERY     x 2  . CHOLECYSTECTOMY     2008  . ERCP N/A 07/24/2017   Procedure: ENDOSCOPIC RETROGRADE CHOLANGIOPANCREATOGRAPHY (ERCP);  Surgeon: Clarene Essex, MD;  Location: [facility];  Service: Endoscopy;  Laterality: N/A;  . EYE SURGERY     bilat cataract  . INCISION AND DRAINAGE HIP  07/18/2011   Procedure: IRRIGATION AND DEBRIDEMENT HIP;  Surgeon: Garald Balding, MD;  Location: [facility];  Service: Orthopedics;  Laterality: Right;  RIGHT HIP EXPLORATION, EXCISION OF DEEP BURSAL SAC  . KIDNEY STONE SURGERY  2011   R  . KNEE  ARTHROSCOPY     Left  . SHOULDER ARTHROSCOPY     Left  . TOTAL HIP ARTHROPLASTY     Right  . TOTAL HIP REVISION     Right  . TOTAL KNEE ARTHROPLASTY Right 03/31/2014   Procedure: TOTAL KNEE ARTHROPLASTY;  Surgeon: Garald Balding, MD;  Location: [facility];  Service: Orthopedics;  Laterality: Right;  . TOTAL KNEE ARTHROPLASTY Left 08/01/2016   Procedure: TOTAL KNEE ARTHROPLASTY;  Surgeon: Garald Balding, MD;  Location: [facility];  Service: Orthopedics;  Laterality: Left;  . WRIST SURGERY     bilateral, plates    FAMHx:  Family History  Problem Relation  Age of Onset  . Heart disease Mother   . Heart attack Mother   . Suicidality Mother   . Anesthesia problems Neg Hx   . Hypotension Neg Hx   . Malignant hyperthermia Neg Hx   . Pseudochol deficiency Neg Hx     SOCHx:   reports that he has never smoked. He has never used smokeless tobacco. He reports that he does not drink alcohol and does not use drugs.  ALLERGIES:  Allergies  Allergen Reactions  . Tape Other (See Comments)    Adhesive tape blisters  . Erythromycin Rash  . Sulfa Antibiotics Rash  . Tetracyclines & Related Rash    ROS: Pertinent items noted in HPI and remainder of comprehensive ROS otherwise negative.  HOME MEDS: Current Outpatient Medications  Medication Sig Dispense Refill  . albuterol (VENTOLIN HFA) 108 (90 Base) MCG/ACT inhaler Inhale 2 puffs into the lungs every 6 (six) hours as needed for wheezing or shortness of breath. Future refills from PCP. 1 g 0  . Ascorbic Acid (VITAMIN C) 500 MG tablet Take 1 tablet (500 mg total) by mouth daily. 30 tablet   . diclofenac sodium (VOLTAREN) 1 % GEL Apply 4 g topically 3 (three) times daily as needed (Apply to large joint up to 3 times daily as needed). 3 Tube 3  . flecainide (TAMBOCOR) 100 MG tablet TAKE 1 TABLET BY MOUTH TWICE DAILY. 180 tablet 3  . losartan (COZAAR) 25 MG tablet Take 1 tablet (25 mg total) by mouth daily. 90 tablet 3  . metoprolol succinate  (TOPROL-XL) 25 MG 24 hr tablet Take 1 tablet (25 mg total) by mouth daily. 90 tablet 3  . potassium chloride SA (KLOR-CON) 20 MEQ tablet Take 1 tablet (20 mEq total) by mouth daily. 90 tablet 3   No current facility-administered medications for this visit.    LABS/IMAGING: No results found for this or any previous visit (from the past 48 hour(s)). No results found.  WEIGHTS: Wt Readings from Last 3 Encounters:  07/14/20 237 lb 9.6 oz (107.8 kg)  06/22/20 238 lb 9.6 oz (108.2 kg)  01/16/20 236 lb 9.6 oz (107.3 kg)    VITALS: BP 122/70   Pulse (!) 55   Ht 6\' 4"  (1.93 m)   Wt 237 lb 9.6 oz (107.8 kg)   BMI 28.92 kg/m   EXAM: General appearance: alert and no distress Neck: no carotid bruit and no JVD Lungs: clear to auscultation bilaterally Heart: regular rate and rhythm, S1, S2 normal, no murmur, click, rub or gallop Abdomen: soft, non-tender; bowel sounds normal; no masses,  no organomegaly Extremities: extremities normal, atraumatic, no cyanosis or edema Pulses: 2+ and symmetric Skin: Skin color, texture, turgor normal. No rashes or lesions Neurologic: Grossly normal Psych: Pleasant  EKG: Sinus bradycardia with first-degree AV block at 55, RBBB-personally reviewed  ASSESSMENT: 1. Symptomatic bigeminal PVCs - suppressed on flecainide 2. Acute systolic heart failure-suspect PVC mediated, LVEF improved to 50-55% (05/2019) 3. Shortness of breath 4. RBBB 5. Hypertension  PLAN: 1.   [name] continues to do well and has had suppression of his PVCs.  His heart failure has improved.  Blood pressure is well controlled today.  He is asymptomatic.  No changes in his medicines.  Dr. Curt Bears had ordered a treadmill stress test in 2020 prior to starting the flecainide.  This will likely be due again next year and I will defer to him with that.  Follow-up 6 months.  Pixie Casino, MD, [facility],  [place] Director of the Advanced Lipid Disorders  &  Cardiovascular Risk Reduction Clinic Diplomate of the American Board of Clinical Lipidology Attending Cardiologist  Direct Dial: [phone]  Fax: [phone]  Website:  [email or web address] 07/14/2020, 10:38 AM

## 2020-07-14 NOTE — Patient Instructions (Signed)

## 2020-07-17 ENCOUNTER — Ambulatory Visit: Payer: Medicare Other | Attending: Internal Medicine

## 2020-07-17 DIAGNOSIS — Z23 Encounter for immunization: Secondary | ICD-10-CM

## 2020-07-17 NOTE — Progress Notes (Signed)
   Covid-19 Vaccination Clinic  Name:  [name]    MRN: [id number] DOB: [birth year]  07/17/2020  [name] was observed post Covid-19 immunization for 15 minutes without incident. He was provided with Vaccine Information Sheet and instruction to access the V-Safe system.   [name] was instructed to call 911 with any severe reactions post vaccine: Marland Kitchen Difficulty breathing  . Swelling of face and throat  . A fast heartbeat  . A bad rash all over body  . Dizziness and weakness   Immunizations Administered    Name Date Dose VIS Date Route   Pfizer COVID-19 Vaccine 07/17/2020  4:09 PM 0.3 mL 06/23/2020 Intramuscular   Manufacturer: [place]   Lot: [id number]   [place]: [id number]

## 2020-10-25 DIAGNOSIS — I429 Cardiomyopathy, unspecified: Secondary | ICD-10-CM | POA: Diagnosis not present

## 2020-10-25 DIAGNOSIS — R7309 Other abnormal glucose: Secondary | ICD-10-CM | POA: Diagnosis not present

## 2020-10-25 DIAGNOSIS — I493 Ventricular premature depolarization: Secondary | ICD-10-CM | POA: Diagnosis not present

## 2020-10-25 DIAGNOSIS — R350 Frequency of micturition: Secondary | ICD-10-CM | POA: Diagnosis not present

## 2020-10-25 DIAGNOSIS — Z0000 Encounter for general adult medical examination without abnormal findings: Secondary | ICD-10-CM | POA: Diagnosis not present

## 2020-10-25 DIAGNOSIS — R361 Hematospermia: Secondary | ICD-10-CM | POA: Diagnosis not present

## 2020-11-03 DIAGNOSIS — R972 Elevated prostate specific antigen [PSA]: Secondary | ICD-10-CM | POA: Diagnosis not present

## 2020-11-03 DIAGNOSIS — R300 Dysuria: Secondary | ICD-10-CM | POA: Diagnosis not present

## 2020-11-03 DIAGNOSIS — R3915 Urgency of urination: Secondary | ICD-10-CM | POA: Diagnosis not present

## 2020-11-03 DIAGNOSIS — N403 Nodular prostate with lower urinary tract symptoms: Secondary | ICD-10-CM | POA: Diagnosis not present

## 2020-11-03 DIAGNOSIS — R3912 Poor urinary stream: Secondary | ICD-10-CM | POA: Diagnosis not present

## 2020-11-03 DIAGNOSIS — R350 Frequency of micturition: Secondary | ICD-10-CM | POA: Diagnosis not present

## 2020-12-03 DIAGNOSIS — C61 Malignant neoplasm of prostate: Secondary | ICD-10-CM | POA: Diagnosis not present

## 2020-12-03 DIAGNOSIS — R972 Elevated prostate specific antigen [PSA]: Secondary | ICD-10-CM | POA: Diagnosis not present

## 2020-12-16 ENCOUNTER — Other Ambulatory Visit (HOSPITAL_COMMUNITY): Payer: Self-pay | Admitting: Urology

## 2020-12-16 ENCOUNTER — Other Ambulatory Visit: Payer: Self-pay | Admitting: Urology

## 2020-12-16 DIAGNOSIS — Z8546 Personal history of malignant neoplasm of prostate: Secondary | ICD-10-CM

## 2020-12-24 DIAGNOSIS — C61 Malignant neoplasm of prostate: Secondary | ICD-10-CM | POA: Diagnosis not present

## 2021-01-04 ENCOUNTER — Encounter (HOSPITAL_COMMUNITY)
Admission: RE | Admit: 2021-01-04 | Discharge: 2021-01-04 | Disposition: A | Discharge status: Home / Self Care | Payer: Medicare Other | Source: Ambulatory Visit | Attending: Urology | Admitting: Urology

## 2021-01-04 ENCOUNTER — Other Ambulatory Visit: Payer: Self-pay

## 2021-01-04 DIAGNOSIS — C61 Malignant neoplasm of prostate: Secondary | ICD-10-CM | POA: Diagnosis not present

## 2021-01-04 DIAGNOSIS — K4090 Unilateral inguinal hernia, without obstruction or gangrene, not specified as recurrent: Secondary | ICD-10-CM | POA: Diagnosis not present

## 2021-01-04 DIAGNOSIS — N210 Calculus in bladder: Secondary | ICD-10-CM | POA: Diagnosis not present

## 2021-01-04 DIAGNOSIS — Z8546 Personal history of malignant neoplasm of prostate: Secondary | ICD-10-CM | POA: Diagnosis not present

## 2021-01-04 DIAGNOSIS — K5750 Diverticulosis of both small and large intestine without perforation or abscess without bleeding: Secondary | ICD-10-CM | POA: Diagnosis not present

## 2021-01-04 MED ORDER — TECHNETIUM TC 99M MEDRONATE IV KIT
20.0000 | PACK | Freq: Once | INTRAVENOUS | Status: AC | PRN
  Administered 2021-01-04: 22 via INTRAVENOUS

## 2021-01-07 DIAGNOSIS — C61 Malignant neoplasm of prostate: Secondary | ICD-10-CM | POA: Diagnosis not present

## 2021-01-09 ENCOUNTER — Other Ambulatory Visit: Payer: Self-pay | Admitting: Internal Medicine

## 2021-01-09 DIAGNOSIS — I119 Hypertensive heart disease without heart failure: Secondary | ICD-10-CM

## 2021-01-11 ENCOUNTER — Other Ambulatory Visit: Payer: Self-pay

## 2021-01-11 ENCOUNTER — Ambulatory Visit (INDEPENDENT_AMBULATORY_CARE_PROVIDER_SITE_OTHER): Payer: Medicare Other

## 2021-01-11 VITALS — HR 56 | Ht 76.0 in | Wt 235.0 lb

## 2021-01-11 DIAGNOSIS — I493 Ventricular premature depolarization: Secondary | ICD-10-CM | POA: Diagnosis not present

## 2021-01-11 NOTE — Patient Instructions (Addendum)
Medication Instructions:  Your physician recommends that you continue on your current medications as directed. Please refer to the Current Medication list given to you today.  *If you need a refill on your cardiac medications before your next appointment, please call your pharmacy*   Follow-Up: At [facility], you and your health needs are our priority.  As part of our continuing mission to provide you with exceptional heart care, we have created designated Provider Care Teams.  These Care Teams include your primary Cardiologist (physician) and Advanced Practice Providers (APPs -  Physician Assistants and Nurse Practitioners) who all work together to provide you with the care you need, when you need it.  Your next appointment:   May 31st at 11:30am  The format for your next appointment:   In Person  Provider:   Allegra Lai, MD    .

## 2021-01-11 NOTE — Addendum Note (Signed)
Addended by: Antonieta Iba on: 01/11/2021 02:04 PM   Modules accepted: Orders

## 2021-01-11 NOTE — Progress Notes (Signed)
1.) Reason for visit: EKG due to patient being on flecainide and frimagon  2.) Name of MD requesting visit: Dr. Curt Bears  3.) Assessment and plan per MD: EKG reviewed by Dr. Ileana Ladd. No changes. Patient can continue on current medications.

## 2021-01-14 DIAGNOSIS — R0602 Shortness of breath: Secondary | ICD-10-CM | POA: Diagnosis not present

## 2021-01-14 DIAGNOSIS — C61 Malignant neoplasm of prostate: Secondary | ICD-10-CM | POA: Diagnosis not present

## 2021-01-14 DIAGNOSIS — M954 Acquired deformity of chest and rib: Secondary | ICD-10-CM | POA: Diagnosis not present

## 2021-01-19 ENCOUNTER — Encounter: Payer: Self-pay | Admitting: Radiation Oncology

## 2021-01-19 ENCOUNTER — Ambulatory Visit
Admission: RE | Admit: 2021-01-19 | Discharge: 2021-01-19 | Disposition: A | Discharge status: Home / Self Care | Payer: Medicare Other | Source: Ambulatory Visit | Attending: Radiation Oncology | Admitting: Radiation Oncology

## 2021-01-19 ENCOUNTER — Other Ambulatory Visit: Payer: Self-pay

## 2021-01-19 VITALS — BP 137/71 | HR 61 | Temp 97.8°F | Resp 20 | Ht 76.0 in | Wt 236.0 lb

## 2021-01-19 DIAGNOSIS — I493 Ventricular premature depolarization: Secondary | ICD-10-CM | POA: Insufficient documentation

## 2021-01-19 DIAGNOSIS — Z79899 Other long term (current) drug therapy: Secondary | ICD-10-CM | POA: Insufficient documentation

## 2021-01-19 DIAGNOSIS — M129 Arthropathy, unspecified: Secondary | ICD-10-CM | POA: Diagnosis not present

## 2021-01-19 DIAGNOSIS — C61 Malignant neoplasm of prostate: Secondary | ICD-10-CM | POA: Insufficient documentation

## 2021-01-19 DIAGNOSIS — Z87442 Personal history of urinary calculi: Secondary | ICD-10-CM | POA: Insufficient documentation

## 2021-01-19 DIAGNOSIS — K219 Gastro-esophageal reflux disease without esophagitis: Secondary | ICD-10-CM | POA: Insufficient documentation

## 2021-01-19 DIAGNOSIS — J45909 Unspecified asthma, uncomplicated: Secondary | ICD-10-CM | POA: Diagnosis not present

## 2021-01-19 HISTORY — DX: Malignant neoplasm of prostate: C61

## 2021-01-19 NOTE — Progress Notes (Signed)
GU Location of Tumor / Histology: prostatic adenocarcinoma  If Prostate Cancer, Gleason Score is (5 + 4) and PSA is (16.44). Prostate volume: 90 grams.  [name] was referred to Dr. Gloriann Loan by his PCP, Dr. Donnie Coffin, for further evaluation of hematospermia x 6 months, irregular prostate exam, increased urgency and frequency, and dysuria.   Biopsies of prostate (if applicable) revealed:   Past/Anticipated interventions by urology, if any: prostate biopsy, CT abd/pelvis, bone scan, referral to Dr. Tammi Klippel for consideration of external beam radiation therapy, Dr. Purvis Sheffield notes detail his intent to order a CT chest to further evaluate uptake in cervical or thoracic spine on bone scan (patient reports CT was done Friday but he doesn't have results).  Patient reports the nystatin given wasn't helpful for redness and irritation on his right inner thigh. Patient verbalizes that the redness has extended further down his right leg.  Past/Anticipated interventions by medical oncology, if any: no  Weight changes, if any: denies  Bowel/Bladder complaints, if any: IPSS 22. SHIM 6. Denies dysuria, hematuria, urinary leakage or incontinence. Endorses taking tamsulosin. Reports 2-3 episodes of abdominal pain followed by nausea. Reports occasional episodes of bowel urgency.  Nausea/Vomiting, if any: Denies any recently.  Pain issues, if any:  Reports left hip and low back pain.  SAFETY ISSUES:  Prior radiation? denies  Pacemaker/ICD? Denies. Patient is taking flecainide.  Possible current pregnancy? no, male patient  Is the patient on methotrexate? denies  Current Complaints / other details:  86 year old male. Married with two daughters. Resides in [place]. Retired.   Patient received Mills Koller on 01/04/2021  Brother - throat cancer.

## 2021-01-19 NOTE — Progress Notes (Signed)
Radiation Oncology         [phone] ________________________________  Initial outpatient Consultation  Name: [name] MRN: [id number]  Date: 01/19/2021  DOB: [birth year]  CC:Mitchell, L.Marlou Sa, MD  Lucas Mallow, MD   REFERRING PHYSICIAN: Lucas Mallow, MD  DIAGNOSIS: 86 y.o. 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    ICD-10-CM   1. Malignant neoplasm of prostate ([place])  C61     HISTORY OF PRESENT ILLNESS: [name] is a 86 y.o. male with a diagnosis of prostate cancer. He is an established urology patient with a prior history of nephrolithiasis but was recently was noted to have an elevated PSA of 16.44 on labs 10/25/20 with his primary care physician, Dr. Donnie Coffin.  Accordingly, he was referred for evaluation in urology by Dr. Gloriann Loan on 11/03/20,  digital rectal examination was performed at that time revealing diffuse firmness and irregularity, consistent with malignancy.  The patient proceeded to transrectal ultrasound with 12 biopsies of the prostate on 12/03/20.  The prostate volume measured 90 cc.  Out of 12 core biopsies, 12 were positive.  The maximum Gleason score was 5+4, and this was seen in the left apex, left apex lateral, right base and right mid.  Additionally, Gleason 4+5 was seen in the left base, left base lateral, right base lateral, right mid lateral, right apex and right apex lateral and Gleason 4+4 was found in the left mid and left mid lateral cores. There was perineural invasion and extraprostatic extension noted in multiple cores bilaterally.  Given his high volume, high risk disease, he underwent disease staging with CT A/P and bone scan on 01/04/2021.  The CT A/P was without evidence of metastatic disease but bone bone scan, there were scattered foci of uptake in bilateral ribs, not typical for trauma so the recommendation was for further evaluation with a chest CT.  There was also some  increased uptake in the cervicothoracic junction felt most likely to be degenerative but could not rule out metastatic disease.   He met back with Dr. Gloriann Loan on 01/07/2021 to review the imaging results and was started on Firmagon at that time, with plans for a 74-monthLupron injection in June 2022.  A CT chest was performed on 01/14/2021 for further evaluation of the abnormal findings on bone scan and this was without any evidence of osseous or soft tissue metastatic disease in the chest.  The patient reviewed the biopsy and imaging results with his urologist and he has kindly been referred today for discussion of potential radiation treatment options.   PREVIOUS RADIATION THERAPY: No  PAST MEDICAL HISTORY:  Past Medical History:  Diagnosis Date  . Arthritis   . Asthma    only if develops cold  . Dysrhythmia    h/o pvc's  . GERD (gastroesophageal reflux disease)   . Kidney stone on left side   . [place]   . PVC (premature ventricular contraction)   . Recurrent upper respiratory infection (URI) 03/2011      PAST SURGICAL HISTORY: Past Surgical History:  Procedure Laterality Date  . APPENDECTOMY    . BACK SURGERY     x 4  . CARDIAC CATHETERIZATION     july 2010-minor irregl  . CERVICAL SPINE SURGERY     x 2  . CHOLECYSTECTOMY     2008  . ERCP N/A 07/24/2017   Procedure: ENDOSCOPIC RETROGRADE CHOLANGIOPANCREATOGRAPHY (ERCP);  Surgeon: Clarene Essex, MD;  Location: [facility];  Service: Endoscopy;  Laterality: N/A;  . EYE SURGERY     bilat cataract  . INCISION AND DRAINAGE HIP  07/18/2011   Procedure: IRRIGATION AND DEBRIDEMENT HIP;  Surgeon: Garald Balding, MD;  Location: [facility];  Service: Orthopedics;  Laterality: Right;  RIGHT HIP EXPLORATION, EXCISION OF DEEP BURSAL SAC  . KIDNEY STONE SURGERY  2011   R  . KNEE ARTHROSCOPY     Left  . PROSTATE BIOPSY    . SHOULDER ARTHROSCOPY     Left  . TOTAL HIP ARTHROPLASTY     Right  . TOTAL HIP REVISION     Right  .  TOTAL KNEE ARTHROPLASTY Right 03/31/2014   Procedure: TOTAL KNEE ARTHROPLASTY;  Surgeon: Garald Balding, MD;  Location: [facility];  Service: Orthopedics;  Laterality: Right;  . TOTAL KNEE ARTHROPLASTY Left 08/01/2016   Procedure: TOTAL KNEE ARTHROPLASTY;  Surgeon: Garald Balding, MD;  Location: [facility];  Service: Orthopedics;  Laterality: Left;  . WRIST SURGERY     bilateral, plates    FAMILY HISTORY:  Family History  Problem Relation Age of Onset  . Heart disease Mother   . Heart attack Mother   . Suicidality Mother   . Anesthesia problems Neg Hx   . Hypotension Neg Hx   . Malignant hyperthermia Neg Hx   . Pseudochol deficiency Neg Hx   . Breast cancer Neg Hx   . Prostate cancer Neg Hx   . Colon cancer Neg Hx   . Pancreatic cancer Neg Hx     SOCIAL HISTORY:  Social History   Socioeconomic History  . Marital status: Married    Spouse name: Not on file  . Number of children: Not on file  . Years of education: Not on file  . Highest education level: Not on file  Occupational History    Comment: retired  Tobacco Use  . Smoking status: Never Smoker  . Smokeless tobacco: Never Used  Vaping Use  . Vaping Use: Never used  Substance and Sexual Activity  . Alcohol use: No    Alcohol/week: 0.0 standard drinks  . Drug use: No  . Sexual activity: Not Currently  Other Topics Concern  . Not on file  Social History Narrative  . Not on file   Social Determinants of Health   Financial Resource Strain: Not on file  Food Insecurity: Not on file  Transportation Needs: Not on file  Physical Activity: Not on file  Stress: Not on file  Social Connections: Not on file  Intimate Partner Violence: Not on file    ALLERGIES: Tape, Erythromycin, Sulfa antibiotics, and Tetracyclines & related  MEDICATIONS:  Current Outpatient Medications  Medication Sig Dispense Refill  . albuterol (VENTOLIN HFA) 108 (90 Base) MCG/ACT inhaler Inhale 2 puffs into the lungs every 6 (six) hours as  needed for wheezing or shortness of breath. Future refills from PCP. 1 g 0  . Ascorbic Acid (VITAMIN C) 500 MG tablet Take 1 tablet (500 mg total) by mouth daily. 30 tablet   . cephALEXin (KEFLEX) 500 MG capsule Take 500 mg by mouth 2 (two) times daily.    . degarelix (FIRMAGON) 80 MG injection Inject 80 mg into the skin every 28 (twenty-eight) days.    . diclofenac sodium (VOLTAREN) 1 % GEL Apply 4 g topically 3 (three) times daily as needed (Apply to large joint up to 3 times daily as needed). 3 Tube 3  .  flecainide (TAMBOCOR) 100 MG tablet TAKE 1 TABLET BY MOUTH TWICE DAILY. 180 tablet 3  . losartan (COZAAR) 25 MG tablet Take 1 tablet (25 mg total) by mouth daily. 90 tablet 3  . losartan (COZAAR) 50 MG tablet Take 0.5 tablets by mouth daily.    . metoprolol succinate (TOPROL-XL) 25 MG 24 hr tablet TAKE 1 TABLET ONCE DAILY. 90 tablet 3  . nystatin cream (MYCOSTATIN) Apply 1 application topically 2 (two) times daily.    . potassium chloride SA (KLOR-CON) 20 MEQ tablet TAKE 1 TABLET ONCE DAILY. 90 tablet 3  . tamsulosin (FLOMAX) 0.4 MG CAPS capsule Take 0.4 mg by mouth daily.     No current facility-administered medications for this encounter.    REVIEW OF SYSTEMS:  On review of systems, the patient reports that he is doing well overall. He denies any chest pain, shortness of breath, cough, fevers, chills, night sweats, unintended weight changes. He denies any bowel disturbances, and denies abdominal pain, nausea or vomiting. He denies any new musculoskeletal or joint aches or pains. His IPSS was 22, indicating severe urinary symptoms with urgency, frequency, weak stream, incomplee emptying and nocturia x2/night despite taking Flomax daily. His SHIM was 6, indicating he has severe erectile dysfunction. A complete review of systems is obtained and is otherwise negative.    PHYSICAL EXAM:  Wt Readings from Last 3 Encounters:  01/11/21 235 lb (106.6 kg)  07/14/20 237 lb 9.6 oz (107.8 kg)   06/22/20 238 lb 9.6 oz (108.2 kg)   Temp Readings from Last 3 Encounters:  01/16/20 (!) 97.1 F (36.2 C)  07/11/19 (!) 97.2 F (36.2 C)  07/24/17 97.7 F (36.5 C) (Oral)   BP Readings from Last 3 Encounters:  07/14/20 122/70  06/22/20 104/64  01/16/20 119/65   Pulse Readings from Last 3 Encounters:  01/11/21 (!) 56  07/14/20 (!) 55  06/22/20 (!) 54    /10  In general this is a well appearing Caucasian male in no acute distress. He's alert and oriented x4 and appropriate throughout the examination. Cardiopulmonary assessment is negative for acute distress, and he exhibits normal effort.     KPS = 90  100 - Normal; no complaints; no evidence of disease. 90   - Able to carry on normal activity; minor signs or symptoms of disease. 80   - Normal activity with effort; some signs or symptoms of disease. 34   - Cares for self; unable to carry on normal activity or to do active work. 60   - Requires occasional assistance, but is able to care for most of his personal needs. 50   - Requires considerable assistance and frequent medical care. 85   - Disabled; requires special care and assistance. 36   - Severely disabled; hospital admission is indicated although death not imminent. 53   - Very sick; hospital admission necessary; active supportive treatment necessary. 10   - Moribund; fatal processes progressing rapidly. 0     - Dead  Karnofsky DA, Abelmann Pritchett, Craver LS and Burchenal [facility] [phone]) The use of the nitrogen mustards in the palliative treatment of carcinoma: with particular reference to bronchogenic carcinoma Cancer 1 634-56  LABORATORY DATA:  Lab Results  Component Value Date   WBC 6.1 11/04/2018   HGB 16.7 11/04/2018   HCT 49.4 11/04/2018   MCV 92 11/04/2018   PLT 159 11/04/2018   Lab Results  Component Value Date   NA 142 11/04/2018   K 4.5 11/04/2018  CL 101 11/04/2018   CO2 23 11/04/2018   Lab Results  Component Value Date   ALT 17 11/04/2018   AST 19  11/04/2018   ALKPHOS 54 11/04/2018   BILITOT 1.2 11/04/2018     RADIOGRAPHY: NM Bone Scan Whole Body  Result Date: 01/06/2021 CLINICAL DATA:  Prostate cancer EXAM: NUCLEAR MEDICINE WHOLE BODY BONE SCAN TECHNIQUE: Whole body anterior and posterior images were obtained approximately 3 hours after intravenous injection of radiopharmaceutical. RADIOPHARMACEUTICALS:  22 mCi Technetium-25mMDP IV COMPARISON:  09/26/2005 FINDINGS: Physiologic distribution of radiotracer with bilateral renal uptake and excretion. Somewhat heterogeneous appearance of the bilateral ribs both posteriorly and anteriorly. Two sites of focal uptake within the spine at the cervicothoracic junction, 1 at the midline and 1 left paramidline, which could be degenerative. Additional sites of suspected degenerative uptake within the bilateral shoulders, bilateral sternoclavicular joints, bilateral SI joints, bilateral hips, and both feet. Photopenia at the bilateral knees suggesting bilateral knee arthroplasties. IMPRESSION: 1. Nonspecific scattered foci of subtle uptake within the bilateral ribs. Distribution is not typical for trauma. Recommend further evaluation with CT of the chest. 2. Two focal areas of uptake within the spine at the cervicothoracic junction, which may be degenerative. This finding can also be further evaluated on the previously recommended CT chest. 3. Additional scattered areas of uptake, which are favored degenerative. Electronically Signed   By: NDavina PokeD.O.   On: 01/06/2021 14:17      IMPRESSION/PLAN: 1. 86y.o. gentleman with Stage T2c adenocarcinoma of the prostate with Gleason Score of 5+4, and PSA of 16.44. We discussed the patient's workup and outlined the nature of prostate cancer in this setting. The patient's T stage, Gleason's score, and PSA put him into the high risk group. Accordingly, he is eligible for a variety of potential treatment options including prostatectomy or LT-ADT in combination  with either 8 weeks of external radiation or 5 weeks of external radiation with an upfront brachytherapy boost.  We discussed the available radiation techniques, and focused on the details and logistics of delivery. Given his advanced age, he is not felt to be an ideal surgical candidate and based on the large volume of his prostate, he is not felt to be an ideal candidate for brachytherapy boost.  Therefore, we discussed and outlined the risks, benefits, short and long-term effects associated with daily external beam radiotherapy and compared and contrasted these with prostatectomy. We discussed the role of SpaceOAR gel in reducing the rectal toxicity associated with radiotherapy. We also detailed the role of ADT in the treatment of high risk prostate cancer and outlined the associated side effects that could be expected with this therapy.  He has already started ADT with a Firmagon injection given in Dr. BPurvis Sheffieldoffice on 01/07/2021.  We discussed the rationale behind the intentional delay in starting radiotherapy approximately 2 months after the start of ADT to allow for the radiosensitizing effects of this therapy.  He appears to have a good understanding of his disease and our treatment recommendations which are of curative intent.  He was encouraged to ask questions that were answered to his stated satisfaction.  At the conclusion of our conversation, the patient is interested in moving forward with 8 weeks of external beam therapy concurrent with ADT. He has received his first ADT injection with Firmagon on 01/07/2021 and is scheduled for a 315-monthupron injection in June 2022. We will share our discussion with Dr. BeGloriann Loannd make arrangements for fiducial markers and SpaceOAR  gel placement in 03/2021, prior to simulation, to reduce rectal toxicity from radiotherapy. The patient appears to have a good understanding of his disease and our treatment recommendations which are of curative intent and is in agreement  with the stated plan.  Therefore, we will move forward with treatment planning accordingly, in anticipation of beginning the daily IMRT in July 2022, approximately 2 months after starting ADT.   We personally spent 80 minutes in this encounter including chart review, reviewing radiological studies, meeting face-to-face with the patient, entering orders and completing documentation.    Nicholos Johns, PA-C    Tyler Pita, MD  [facility] Direct Dial: [phone]  Fax: [phone] [email or web address]  Skype  LinkedIn

## 2021-01-20 ENCOUNTER — Encounter: Payer: Self-pay | Admitting: Licensed Clinical Social Worker

## 2021-01-20 NOTE — Progress Notes (Signed)
[name]  Clinical Social Christian was referred by distress screening protocol.  The patient scored a 7 on the Psychosocial Distress Thermometer which indicates moderate distress. Clinical Social Worker attempted to contact patient by phone to assess for distress and other psychosocial needs.  No answer, left VM with direct contact information.  ONCBCN DISTRESS SCREENING 01/19/2021  Screening Type Initial Screening  Distress experienced in past week (1-10) 7  Emotional problem type Adjusting to illness  Information Concerns Type Lack of info about treatment  Physician notified of physical symptoms Yes  Referral to clinical psychology No  Referral to clinical social Christian Yes  Referral to dietition No  Referral to financial advocate No  Referral to support programs Yes  Referral to palliative care No       George Qian E Ameri Cahoon, LCSW

## 2021-01-21 ENCOUNTER — Other Ambulatory Visit: Payer: Self-pay | Admitting: Internal Medicine

## 2021-01-21 ENCOUNTER — Encounter: Payer: Self-pay | Admitting: Medical Oncology

## 2021-01-24 NOTE — Telephone Encounter (Signed)
This is Dr. Hilty's pt 

## 2021-02-01 ENCOUNTER — Other Ambulatory Visit: Payer: Self-pay

## 2021-02-01 ENCOUNTER — Encounter: Payer: Self-pay | Admitting: Cardiology

## 2021-02-01 ENCOUNTER — Ambulatory Visit (INDEPENDENT_AMBULATORY_CARE_PROVIDER_SITE_OTHER): Payer: Medicare Other | Admitting: Cardiology

## 2021-02-01 VITALS — BP 122/62 | HR 61 | Ht 76.0 in | Wt 235.2 lb

## 2021-02-01 DIAGNOSIS — I493 Ventricular premature depolarization: Secondary | ICD-10-CM | POA: Diagnosis not present

## 2021-02-01 NOTE — Patient Instructions (Signed)
Medication Instructions:  °Your physician recommends that you continue on your current medications as directed. Please refer to the Current Medication list given to you today. ° °*If you need a refill on your cardiac medications before your next appointment, please call your pharmacy* ° ° °Lab Work: °None ordered ° ° °Testing/Procedures: °None ordered ° ° °Follow-Up: °At [facility], you and your health needs are our priority.  As part of our continuing mission to provide you with exceptional heart care, we have created designated Provider Care Teams.  These Care Teams include your primary Cardiologist (physician) and Advanced Practice Providers (APPs -  Physician Assistants and Nurse Practitioners) who all work together to provide you with the care you need, when you need it. ° °Your next appointment:   °6 month(s) ° °The format for your next appointment:   °In Person ° °Provider:   °Will Camnitz, MD ° ° ° °Thank you for choosing [facility]!! ° ° °Aasim Restivo, RN °[phone] °  °

## 2021-02-01 NOTE — Progress Notes (Signed)
Electrophysiology Office Note   Date:  02/01/2021   ID:  [name], DOB 06/27/1935, MRN [id number]  PCP:  Aurea Graff.Marlou Sa, MD  Cardiologist:  Debara Pickett Primary Electrophysiologist:  Mirca Yale [name], MD    No chief complaint on file.    History of Present Illness: [name] is a 86 y.o. male who is being seen today for evaluation of PVCs at the request of Mali Hilty.  He is presenting for electrophysiology evaluation.    He has a history of PVCs and CHF.  He had an echo that showed an ejection fraction of 35 to 40%.  Holter monitor showed a 15% PVC burden.  He was put on flecainide with a decrease in his PVC burden.  Repeat echo showed normalization of his ejection fraction.    Today, denies symptoms of palpitations, chest pain, shortness of breath, orthopnea, PND, lower extremity edema, claudication, dizziness, presyncope, syncope, bleeding, or neurologic sequela. The patient is tolerating medications without difficulties.  He currently feels well.  He is able to do all of his daily activities without restriction.  He unfortunately has been diagnosed with prostate cancer.  He is on hormone therapy now and has plans for radiation therapy starting in July.  Past Medical History:  Diagnosis Date  . Arthritis   . Asthma    only if develops cold  . Dysrhythmia    h/o pvc's  . GERD (gastroesophageal reflux disease)   . Kidney stone on left side   . Prostate cancer ([place])   . PVC (premature ventricular contraction)   . Recurrent upper respiratory infection (URI) 03/2011   Past Surgical History:  Procedure Laterality Date  . APPENDECTOMY    . BACK SURGERY     x 4  . CARDIAC CATHETERIZATION     july 2010-minor irregl  . CERVICAL SPINE SURGERY     x 2  . CHOLECYSTECTOMY     2008  . ERCP N/A 07/24/2017   Procedure: ENDOSCOPIC RETROGRADE CHOLANGIOPANCREATOGRAPHY (ERCP);  Surgeon: Clarene Essex, MD;  Location: [facility];  Service: Endoscopy;  Laterality: N/A;  . EYE  SURGERY     bilat cataract  . INCISION AND DRAINAGE HIP  07/18/2011   Procedure: IRRIGATION AND DEBRIDEMENT HIP;  Surgeon: Garald Balding, MD;  Location: [place] [facility];  Service: Orthopedics;  Laterality: Right;  RIGHT HIP EXPLORATION, EXCISION OF DEEP BURSAL SAC  . KIDNEY STONE SURGERY  2011   R  . KNEE ARTHROSCOPY     Left  . PROSTATE BIOPSY    . SHOULDER ARTHROSCOPY     Left  . TOTAL HIP ARTHROPLASTY     Right  . TOTAL HIP REVISION     Right  . TOTAL KNEE ARTHROPLASTY Right 03/31/2014   Procedure: TOTAL KNEE ARTHROPLASTY;  Surgeon: Garald Balding, MD;  Location: [place];  Service: Orthopedics;  Laterality: Right;  . TOTAL KNEE ARTHROPLASTY Left 08/01/2016   Procedure: TOTAL KNEE ARTHROPLASTY;  Surgeon: Garald Balding, MD;  Location: [place];  Service: Orthopedics;  Laterality: Left;  . WRIST SURGERY     bilateral, plates     Current Outpatient Medications  Medication Sig Dispense Refill  . albuterol (VENTOLIN HFA) 108 (90 Base) MCG/ACT inhaler Inhale 2 puffs into the lungs every 6 (six) hours as needed for wheezing or shortness of breath. Future refills from PCP. 1 g 0  . Ascorbic Acid (VITAMIN C) 500 MG tablet Take 1 tablet (500 mg total) by mouth daily. 30 tablet   .  degarelix (FIRMAGON) 80 MG injection Inject 80 mg into the skin every 28 (twenty-eight) days.    . diclofenac sodium (VOLTAREN) 1 % GEL Apply 4 g topically 3 (three) times daily as needed (Apply to large joint up to 3 times daily as needed). 3 Tube 3  . flecainide (TAMBOCOR) 100 MG tablet TAKE 1 TABLET BY MOUTH TWICE DAILY. 180 tablet 3  . losartan (COZAAR) 25 MG tablet Take 1 tablet (25 mg total) by mouth daily. 90 tablet 3  . losartan (COZAAR) 50 MG tablet TAKE ONE-HALF TABLET ONCE DAILY. *NOTE 50MG  TABLETS, 25MG  TABLETS ON BACKORDER. 45 tablet 0  . metoprolol succinate (TOPROL-XL) 25 MG 24 hr tablet TAKE 1 TABLET ONCE DAILY. 90 tablet 3  . nystatin cream (MYCOSTATIN) Apply 1 application topically 2 (two) times  daily.    . potassium chloride SA (KLOR-CON) 20 MEQ tablet TAKE 1 TABLET ONCE DAILY. 90 tablet 3  . tamsulosin (FLOMAX) 0.4 MG CAPS capsule Take 0.4 mg by mouth daily.     No current facility-administered medications for this visit.    Allergies:   Tape, Erythromycin, Sulfa antibiotics, and Tetracyclines & related   Social History:  The patient  reports that he has never smoked. He has never used smokeless tobacco. He reports that he does not drink alcohol and does not use drugs.   Family History:  The patient's family history includes Heart attack in his mother; Heart disease in his mother; Suicidality in his mother; Throat cancer in his brother and paternal aunt.   ROS:  Please see the history of present illness.   Otherwise, review of systems is positive for none.   All other systems are reviewed and negative.   PHYSICAL EXAM: VS:  BP 122/62   Pulse 61   Ht 6\' 4"  (1.93 m)   Wt 235 lb 3.2 oz (106.7 kg)   SpO2 93%   BMI 28.63 kg/m  , BMI Body mass index is 28.63 kg/m. GEN: Well nourished, well developed, in no acute distress  HEENT: normal  Neck: no JVD, carotid bruits, or masses Cardiac: RRR; no murmurs, rubs, or gallops,no edema  Respiratory:  clear to auscultation bilaterally, normal work of breathing GI: soft, nontender, nondistended, + BS MS: no deformity or atrophy  Skin: warm and dry Neuro:  Strength and sensation are intact Psych: euthymic mood, full affect  EKG:  EKG is ordered today. Personal review of the ekg ordered shows sinus rhythm, right bundle branch block  Recent Labs: No results found for requested labs within last 8760 hours.    Lipid Panel     Component Value Date/Time   CHOL 164 10/15/2015 0950   TRIG 115 10/15/2015 0950   HDL 47 10/15/2015 0950   CHOLHDL 3.5 10/15/2015 0950   VLDL 23 10/15/2015 0950   LDLCALC 94 10/15/2015 0950     Wt Readings from Last 3 Encounters:  02/01/21 235 lb 3.2 oz (106.7 kg)  01/19/21 236 lb (107 kg)  01/11/21  235 lb (106.6 kg)      Other studies Reviewed: Additional studies/ records that were reviewed today include: TTE 08/26/18  Review of the above records today demonstrates:  1. The left ventricle has low normal systolic function, with an ejection  fraction of 50-55%. The cavity size was normal. There is mildly increased  left ventricular wall thickness. Left ventricular diastolic Doppler  parameters are consistent with  pseudonormalization.  2. The right ventricle has normal systolic function. The cavity was  normal. There  is no increase in right ventricular wall thickness.  3. Left atrial size was moderately dilated.  4. The aortic valve is grossly normal. Mild thickening of the aortic  valve. Mild calcification of the aortic valve. No stenosis of the aortic  valve.  5. The aorta is normal unless otherwise noted.  6. The aortic root and ascending aorta are normal in size and structure.  7. The atrial septum is grossly normal.   Holter 08/30/18 - personally reviewed Sinus rhythm with frequent PVC's (burden ~15%) and infrequent PAC's.  ASSESSMENT AND PLAN:  1.  PVCs: Currently on flecainide.  They appear to be outflow tract origin.  His right bundle branch block is unchanged since initiation of his flecainide.  He continues to have minimal PVCs.  We Buryl Bamber continue with current management.    2.  Hypertension: currently well controlled  3.  Nonischemic cardiomyopathy: Likely due to his elevated PVC burden.  He is currently on flecainide.  High risk medication monitoring.  Repeat echo shows normalization of his ejection fraction.  Continue with current management.  Current medicines are reviewed at length with the patient today.   The patient does not have concerns regarding his medicines.  The following changes were made today: none  Labs/ tests ordered today include:  Orders Placed This Encounter  Procedures  . EKG 12-Lead    Disposition:   FU with Naziah Portee 6  months  Signed, Lakita Sahlin [name], MD  02/01/2021 11:36 AM     [facility] [address] [place] [address] [phone] (office) [phone] (fax)

## 2021-02-04 DIAGNOSIS — C61 Malignant neoplasm of prostate: Secondary | ICD-10-CM | POA: Diagnosis not present

## 2021-02-11 DIAGNOSIS — C61 Malignant neoplasm of prostate: Secondary | ICD-10-CM | POA: Diagnosis not present

## 2021-02-22 ENCOUNTER — Encounter: Payer: Self-pay | Admitting: Urology

## 2021-02-22 NOTE — Progress Notes (Signed)
Patient is scheduled for fiducial markers and SpaceOAR gel placement 03/08/21 and will have CT [facility] 03/15/21.

## 2021-03-08 DIAGNOSIS — C61 Malignant neoplasm of prostate: Secondary | ICD-10-CM | POA: Diagnosis not present

## 2021-03-14 ENCOUNTER — Telehealth: Payer: Self-pay | Admitting: *Deleted

## 2021-03-14 NOTE — Telephone Encounter (Signed)
CALLED PATIENT TO REMIND OF SIM APPT. FOR 03-15-21 - ARRIVAL TIME- 1:15 PM @ [place], SPOKE WITH PATIENT AND HE IS AWARE OF THIS APPT.

## 2021-03-15 ENCOUNTER — Ambulatory Visit
Admission: RE | Admit: 2021-03-15 | Discharge: 2021-03-15 | Disposition: A | Discharge status: Home / Self Care | Payer: Medicare Other | Source: Ambulatory Visit | Attending: Radiation Oncology | Admitting: Radiation Oncology

## 2021-03-15 ENCOUNTER — Other Ambulatory Visit: Payer: Self-pay

## 2021-03-15 DIAGNOSIS — Z510 Encounter for antineoplastic radiation therapy: Secondary | ICD-10-CM | POA: Insufficient documentation

## 2021-03-15 DIAGNOSIS — C61 Malignant neoplasm of prostate: Secondary | ICD-10-CM

## 2021-03-15 DIAGNOSIS — R300 Dysuria: Secondary | ICD-10-CM | POA: Insufficient documentation

## 2021-03-15 NOTE — Progress Notes (Signed)
  Radiation Oncology         ([phone]) [phone] ________________________________  Name: [name] MRN: [id number]  Date: 03/15/2021  DOB: 1935/07/19  SIMULATION AND TREATMENT PLANNING NOTE    ICD-10-CM   1. Malignant neoplasm of prostate ([place])  C61       DIAGNOSIS:  86 y.o. 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  NARRATIVE:  The patient was brought to the [facility].  Identity was confirmed.  All relevant records and images related to the planned course of therapy were reviewed.  The patient freely provided informed written consent to proceed with treatment after reviewing the details related to the planned course of therapy. The consent form was witnessed and verified by the simulation staff.  Then, the patient was set-up in a stable reproducible supine position for radiation therapy.  A vacuum lock pillow device was custom fabricated to position his legs in a reproducible immobilized position.  Then, I performed a urethrogram under sterile conditions to identify the prostatic bed.  CT images were obtained.  Surface markings were placed.  The CT images were loaded into the planning software.  Then the prostate bed target, pelvic lymph node target and avoidance structures including the rectum, bladder, bowel and hips were contoured.  Treatment planning then occurred.  The radiation prescription was entered and confirmed.  A total of one complex treatment devices were fabricated. I have requested : Intensity Modulated Radiotherapy (IMRT) is medically necessary for this case for the following reason:  Rectal sparing.Marland Kitchen  PLAN:  The patient will receive 45 Gy in 25 fractions of 1.8 Gy, followed by a boost to the prostate to a total dose of 75 Gy with 15 additional fractions of 2 Gy.   ________________________________  Sheral Apley Tammi Klippel, M.D.

## 2021-03-16 DIAGNOSIS — C61 Malignant neoplasm of prostate: Secondary | ICD-10-CM | POA: Diagnosis not present

## 2021-03-16 DIAGNOSIS — Z510 Encounter for antineoplastic radiation therapy: Secondary | ICD-10-CM | POA: Diagnosis not present

## 2021-03-16 DIAGNOSIS — R300 Dysuria: Secondary | ICD-10-CM | POA: Diagnosis not present

## 2021-03-24 ENCOUNTER — Other Ambulatory Visit: Payer: Self-pay

## 2021-03-24 ENCOUNTER — Ambulatory Visit
Admission: RE | Admit: 2021-03-24 | Discharge: 2021-03-24 | Disposition: A | Discharge status: Home / Self Care | Payer: Medicare Other | Source: Ambulatory Visit | Attending: Radiation Oncology | Admitting: Radiation Oncology

## 2021-03-24 DIAGNOSIS — Z510 Encounter for antineoplastic radiation therapy: Secondary | ICD-10-CM | POA: Diagnosis not present

## 2021-03-24 DIAGNOSIS — C61 Malignant neoplasm of prostate: Secondary | ICD-10-CM | POA: Diagnosis not present

## 2021-03-24 DIAGNOSIS — R300 Dysuria: Secondary | ICD-10-CM | POA: Diagnosis not present

## 2021-03-25 ENCOUNTER — Ambulatory Visit
Admission: RE | Admit: 2021-03-25 | Discharge: 2021-03-25 | Disposition: A | Discharge status: Home / Self Care | Payer: Medicare Other | Source: Ambulatory Visit | Attending: Radiation Oncology | Admitting: Radiation Oncology

## 2021-03-25 DIAGNOSIS — R300 Dysuria: Secondary | ICD-10-CM | POA: Diagnosis not present

## 2021-03-25 DIAGNOSIS — Z510 Encounter for antineoplastic radiation therapy: Secondary | ICD-10-CM | POA: Diagnosis not present

## 2021-03-25 DIAGNOSIS — C61 Malignant neoplasm of prostate: Secondary | ICD-10-CM | POA: Diagnosis not present

## 2021-03-28 ENCOUNTER — Ambulatory Visit
Admission: RE | Admit: 2021-03-28 | Discharge: 2021-03-28 | Disposition: A | Discharge status: Home / Self Care | Payer: Medicare Other | Source: Ambulatory Visit | Attending: Radiation Oncology | Admitting: Radiation Oncology

## 2021-03-28 ENCOUNTER — Other Ambulatory Visit: Payer: Self-pay

## 2021-03-28 DIAGNOSIS — Z510 Encounter for antineoplastic radiation therapy: Secondary | ICD-10-CM | POA: Diagnosis not present

## 2021-03-28 DIAGNOSIS — C61 Malignant neoplasm of prostate: Secondary | ICD-10-CM | POA: Diagnosis not present

## 2021-03-28 DIAGNOSIS — R300 Dysuria: Secondary | ICD-10-CM | POA: Diagnosis not present

## 2021-03-29 ENCOUNTER — Ambulatory Visit
Admission: RE | Admit: 2021-03-29 | Discharge: 2021-03-29 | Disposition: A | Discharge status: Home / Self Care | Payer: Medicare Other | Source: Ambulatory Visit | Attending: Radiation Oncology | Admitting: Radiation Oncology

## 2021-03-29 DIAGNOSIS — C61 Malignant neoplasm of prostate: Secondary | ICD-10-CM | POA: Diagnosis not present

## 2021-03-29 DIAGNOSIS — Z510 Encounter for antineoplastic radiation therapy: Secondary | ICD-10-CM | POA: Diagnosis not present

## 2021-03-29 DIAGNOSIS — R300 Dysuria: Secondary | ICD-10-CM | POA: Diagnosis not present

## 2021-03-30 ENCOUNTER — Other Ambulatory Visit: Payer: Self-pay

## 2021-03-30 ENCOUNTER — Ambulatory Visit: Admission: RE | Admit: 2021-03-30 | Payer: Medicare Other | Source: Ambulatory Visit

## 2021-03-31 ENCOUNTER — Ambulatory Visit
Admission: RE | Admit: 2021-03-31 | Discharge: 2021-03-31 | Disposition: A | Discharge status: Home / Self Care | Payer: Medicare Other | Source: Ambulatory Visit | Attending: Radiation Oncology | Admitting: Radiation Oncology

## 2021-03-31 DIAGNOSIS — R300 Dysuria: Secondary | ICD-10-CM | POA: Diagnosis not present

## 2021-03-31 DIAGNOSIS — Z510 Encounter for antineoplastic radiation therapy: Secondary | ICD-10-CM | POA: Diagnosis not present

## 2021-03-31 DIAGNOSIS — C61 Malignant neoplasm of prostate: Secondary | ICD-10-CM | POA: Diagnosis not present

## 2021-04-01 ENCOUNTER — Ambulatory Visit
Admission: RE | Admit: 2021-04-01 | Discharge: 2021-04-01 | Disposition: A | Discharge status: Home / Self Care | Payer: Medicare Other | Source: Ambulatory Visit | Attending: Radiation Oncology | Admitting: Radiation Oncology

## 2021-04-01 ENCOUNTER — Telehealth: Payer: Self-pay | Admitting: Radiation Oncology

## 2021-04-01 ENCOUNTER — Other Ambulatory Visit: Payer: Self-pay

## 2021-04-01 DIAGNOSIS — Z510 Encounter for antineoplastic radiation therapy: Secondary | ICD-10-CM | POA: Diagnosis not present

## 2021-04-01 DIAGNOSIS — R300 Dysuria: Secondary | ICD-10-CM

## 2021-04-01 DIAGNOSIS — C61 Malignant neoplasm of prostate: Secondary | ICD-10-CM

## 2021-04-01 LAB — URINALYSIS, ROUTINE W REFLEX MICROSCOPIC
Bilirubin Urine: NEGATIVE
Glucose, UA: NEGATIVE mg/dL
Ketones, ur: NEGATIVE mg/dL
Nitrite: NEGATIVE
Protein, ur: >=300 mg/dL — AB
Specific Gravity, Urine: 1.014 (ref 1.005–1.030)
WBC, UA: >50 WBC/hpf — ABNORMAL HIGH (ref 0–5)
pH: 6 (ref 5.0–8.0)

## 2021-04-01 MED ORDER — CIPROFLOXACIN HCL 500 MG PO TABS: 500.0000 mg | ORAL_TABLET | Freq: Two times a day (BID) | ORAL | 0 refills | Status: AC

## 2021-04-01 NOTE — Telephone Encounter (Signed)
Called patient about UA and started empiric Cipro x 7 days pending culture and sensitivities.  Sent eRx to [facility] and told patient.

## 2021-04-02 LAB — URINE CULTURE: Culture: NO GROWTH

## 2021-04-04 ENCOUNTER — Telehealth: Payer: Self-pay

## 2021-04-04 ENCOUNTER — Ambulatory Visit
Admission: RE | Admit: 2021-04-04 | Discharge: 2021-04-04 | Disposition: A | Discharge status: Home / Self Care | Payer: Medicare Other | Source: Ambulatory Visit | Attending: Radiation Oncology | Admitting: Radiation Oncology

## 2021-04-04 ENCOUNTER — Other Ambulatory Visit: Payer: Self-pay

## 2021-04-04 DIAGNOSIS — Z510 Encounter for antineoplastic radiation therapy: Secondary | ICD-10-CM | POA: Insufficient documentation

## 2021-04-04 DIAGNOSIS — C61 Malignant neoplasm of prostate: Secondary | ICD-10-CM | POA: Insufficient documentation

## 2021-04-04 DIAGNOSIS — N401 Enlarged prostate with lower urinary tract symptoms: Secondary | ICD-10-CM | POA: Insufficient documentation

## 2021-04-04 DIAGNOSIS — R300 Dysuria: Secondary | ICD-10-CM | POA: Diagnosis not present

## 2021-04-04 DIAGNOSIS — R338 Other retention of urine: Secondary | ICD-10-CM | POA: Insufficient documentation

## 2021-04-04 NOTE — Telephone Encounter (Signed)
Spoke with patient to inform his urine culture came back negative for any bacterial growth. Patient is aware to keep taking his CIPRO that was given on 04/01/21.

## 2021-04-05 ENCOUNTER — Ambulatory Visit
Admission: RE | Admit: 2021-04-05 | Discharge: 2021-04-05 | Disposition: A | Discharge status: Home / Self Care | Payer: Medicare Other | Source: Ambulatory Visit | Attending: Radiation Oncology | Admitting: Radiation Oncology

## 2021-04-05 DIAGNOSIS — C61 Malignant neoplasm of prostate: Secondary | ICD-10-CM | POA: Diagnosis not present

## 2021-04-05 DIAGNOSIS — R300 Dysuria: Secondary | ICD-10-CM | POA: Diagnosis not present

## 2021-04-05 DIAGNOSIS — Z510 Encounter for antineoplastic radiation therapy: Secondary | ICD-10-CM | POA: Diagnosis not present

## 2021-04-06 ENCOUNTER — Other Ambulatory Visit: Payer: Self-pay

## 2021-04-06 ENCOUNTER — Ambulatory Visit
Admission: RE | Admit: 2021-04-06 | Discharge: 2021-04-06 | Disposition: A | Discharge status: Home / Self Care | Payer: Medicare Other | Source: Ambulatory Visit | Attending: Radiation Oncology | Admitting: Radiation Oncology

## 2021-04-06 DIAGNOSIS — Z510 Encounter for antineoplastic radiation therapy: Secondary | ICD-10-CM | POA: Diagnosis not present

## 2021-04-06 DIAGNOSIS — C61 Malignant neoplasm of prostate: Secondary | ICD-10-CM | POA: Diagnosis not present

## 2021-04-06 DIAGNOSIS — R300 Dysuria: Secondary | ICD-10-CM | POA: Diagnosis not present

## 2021-04-07 ENCOUNTER — Ambulatory Visit
Admission: RE | Admit: 2021-04-07 | Discharge: 2021-04-07 | Disposition: A | Discharge status: Home / Self Care | Payer: Medicare Other | Source: Ambulatory Visit | Attending: Radiation Oncology | Admitting: Radiation Oncology

## 2021-04-07 DIAGNOSIS — C61 Malignant neoplasm of prostate: Secondary | ICD-10-CM | POA: Diagnosis not present

## 2021-04-07 DIAGNOSIS — R300 Dysuria: Secondary | ICD-10-CM | POA: Diagnosis not present

## 2021-04-07 DIAGNOSIS — Z510 Encounter for antineoplastic radiation therapy: Secondary | ICD-10-CM | POA: Diagnosis not present

## 2021-04-08 ENCOUNTER — Ambulatory Visit
Admission: RE | Admit: 2021-04-08 | Discharge: 2021-04-08 | Disposition: A | Discharge status: Home / Self Care | Payer: Medicare Other | Source: Ambulatory Visit | Attending: Radiation Oncology | Admitting: Radiation Oncology

## 2021-04-08 ENCOUNTER — Other Ambulatory Visit: Payer: Self-pay

## 2021-04-08 DIAGNOSIS — R300 Dysuria: Secondary | ICD-10-CM | POA: Diagnosis not present

## 2021-04-08 DIAGNOSIS — Z510 Encounter for antineoplastic radiation therapy: Secondary | ICD-10-CM | POA: Diagnosis not present

## 2021-04-08 DIAGNOSIS — C61 Malignant neoplasm of prostate: Secondary | ICD-10-CM | POA: Diagnosis not present

## 2021-04-11 ENCOUNTER — Ambulatory Visit
Admission: RE | Admit: 2021-04-11 | Discharge: 2021-04-11 | Disposition: A | Discharge status: Home / Self Care | Payer: Medicare Other | Source: Ambulatory Visit | Attending: Radiation Oncology | Admitting: Radiation Oncology

## 2021-04-11 ENCOUNTER — Other Ambulatory Visit: Payer: Self-pay

## 2021-04-11 DIAGNOSIS — C61 Malignant neoplasm of prostate: Secondary | ICD-10-CM | POA: Diagnosis not present

## 2021-04-11 DIAGNOSIS — R300 Dysuria: Secondary | ICD-10-CM | POA: Diagnosis not present

## 2021-04-11 DIAGNOSIS — Z510 Encounter for antineoplastic radiation therapy: Secondary | ICD-10-CM | POA: Diagnosis not present

## 2021-04-12 ENCOUNTER — Ambulatory Visit
Admission: RE | Admit: 2021-04-12 | Discharge: 2021-04-12 | Disposition: A | Discharge status: Home / Self Care | Payer: Medicare Other | Source: Ambulatory Visit | Attending: Radiation Oncology | Admitting: Radiation Oncology

## 2021-04-12 DIAGNOSIS — C61 Malignant neoplasm of prostate: Secondary | ICD-10-CM | POA: Diagnosis not present

## 2021-04-12 DIAGNOSIS — R300 Dysuria: Secondary | ICD-10-CM | POA: Diagnosis not present

## 2021-04-12 DIAGNOSIS — Z510 Encounter for antineoplastic radiation therapy: Secondary | ICD-10-CM | POA: Diagnosis not present

## 2021-04-13 ENCOUNTER — Ambulatory Visit
Admission: RE | Admit: 2021-04-13 | Discharge: 2021-04-13 | Disposition: A | Discharge status: Home / Self Care | Payer: Medicare Other | Source: Ambulatory Visit | Attending: Radiation Oncology | Admitting: Radiation Oncology

## 2021-04-13 ENCOUNTER — Other Ambulatory Visit: Payer: Self-pay

## 2021-04-13 DIAGNOSIS — Z510 Encounter for antineoplastic radiation therapy: Secondary | ICD-10-CM | POA: Diagnosis not present

## 2021-04-13 DIAGNOSIS — R300 Dysuria: Secondary | ICD-10-CM | POA: Diagnosis not present

## 2021-04-13 DIAGNOSIS — C61 Malignant neoplasm of prostate: Secondary | ICD-10-CM | POA: Diagnosis not present

## 2021-04-14 ENCOUNTER — Ambulatory Visit
Admission: RE | Admit: 2021-04-14 | Discharge: 2021-04-14 | Disposition: A | Discharge status: Home / Self Care | Payer: Medicare Other | Source: Ambulatory Visit | Attending: Radiation Oncology | Admitting: Radiation Oncology

## 2021-04-14 DIAGNOSIS — R300 Dysuria: Secondary | ICD-10-CM | POA: Diagnosis not present

## 2021-04-14 DIAGNOSIS — Z510 Encounter for antineoplastic radiation therapy: Secondary | ICD-10-CM | POA: Diagnosis not present

## 2021-04-14 DIAGNOSIS — C61 Malignant neoplasm of prostate: Secondary | ICD-10-CM | POA: Diagnosis not present

## 2021-04-15 ENCOUNTER — Other Ambulatory Visit: Payer: Self-pay

## 2021-04-15 ENCOUNTER — Ambulatory Visit
Admission: RE | Admit: 2021-04-15 | Discharge: 2021-04-15 | Disposition: A | Discharge status: Home / Self Care | Payer: Medicare Other | Source: Ambulatory Visit | Attending: Radiation Oncology | Admitting: Radiation Oncology

## 2021-04-15 DIAGNOSIS — Z510 Encounter for antineoplastic radiation therapy: Secondary | ICD-10-CM | POA: Diagnosis not present

## 2021-04-15 DIAGNOSIS — C61 Malignant neoplasm of prostate: Secondary | ICD-10-CM | POA: Diagnosis not present

## 2021-04-15 DIAGNOSIS — R300 Dysuria: Secondary | ICD-10-CM | POA: Diagnosis not present

## 2021-04-18 ENCOUNTER — Ambulatory Visit
Admission: RE | Admit: 2021-04-18 | Discharge: 2021-04-18 | Disposition: A | Discharge status: Home / Self Care | Payer: Medicare Other | Source: Ambulatory Visit | Attending: Radiation Oncology | Admitting: Radiation Oncology

## 2021-04-18 ENCOUNTER — Other Ambulatory Visit: Payer: Self-pay

## 2021-04-18 ENCOUNTER — Other Ambulatory Visit: Payer: Self-pay | Admitting: Radiation Oncology

## 2021-04-18 DIAGNOSIS — R300 Dysuria: Secondary | ICD-10-CM | POA: Diagnosis not present

## 2021-04-18 DIAGNOSIS — Z510 Encounter for antineoplastic radiation therapy: Secondary | ICD-10-CM | POA: Diagnosis not present

## 2021-04-18 DIAGNOSIS — N401 Enlarged prostate with lower urinary tract symptoms: Secondary | ICD-10-CM

## 2021-04-18 DIAGNOSIS — C61 Malignant neoplasm of prostate: Secondary | ICD-10-CM

## 2021-04-18 LAB — URINALYSIS, COMPLETE (UACMP) WITH MICROSCOPIC
Glucose, UA: 250 mg/dL — AB
Ketones, ur: NEGATIVE mg/dL
Nitrite: POSITIVE — AB
Protein, ur: >300 mg/dL — AB
Specific Gravity, Urine: 1.025 (ref 1.005–1.030)
WBC, UA: >50 WBC/hpf (ref 0–5)
pH: 5.5 (ref 5.0–8.0)

## 2021-04-18 MED ORDER — PHENAZOPYRIDINE HCL 200 MG PO TABS: 200.0000 mg | ORAL_TABLET | Freq: Three times a day (TID) | ORAL | 1 refills | Status: DC | PRN

## 2021-04-18 MED ORDER — TAMSULOSIN HCL 0.4 MG PO CAPS: 0.4000 mg | ORAL_CAPSULE | Freq: Two times a day (BID) | ORAL | 0 refills | Status: DC

## 2021-04-19 ENCOUNTER — Ambulatory Visit (INDEPENDENT_AMBULATORY_CARE_PROVIDER_SITE_OTHER): Payer: Medicare Other | Admitting: Internal Medicine

## 2021-04-19 ENCOUNTER — Other Ambulatory Visit: Payer: Self-pay | Admitting: Urology

## 2021-04-19 ENCOUNTER — Ambulatory Visit
Admission: RE | Admit: 2021-04-19 | Discharge: 2021-04-19 | Disposition: A | Discharge status: Home / Self Care | Payer: Medicare Other | Source: Ambulatory Visit | Attending: Radiation Oncology | Admitting: Radiation Oncology

## 2021-04-19 ENCOUNTER — Encounter: Payer: Self-pay | Admitting: Internal Medicine

## 2021-04-19 ENCOUNTER — Telehealth: Payer: Self-pay

## 2021-04-19 VITALS — BP 120/60 | HR 64 | Ht 76.0 in | Wt 237.0 lb

## 2021-04-19 DIAGNOSIS — Z510 Encounter for antineoplastic radiation therapy: Secondary | ICD-10-CM | POA: Diagnosis not present

## 2021-04-19 DIAGNOSIS — I452 Bifascicular block: Secondary | ICD-10-CM | POA: Diagnosis not present

## 2021-04-19 DIAGNOSIS — I428 Other cardiomyopathies: Secondary | ICD-10-CM | POA: Diagnosis not present

## 2021-04-19 DIAGNOSIS — I493 Ventricular premature depolarization: Secondary | ICD-10-CM | POA: Diagnosis not present

## 2021-04-19 DIAGNOSIS — C61 Malignant neoplasm of prostate: Secondary | ICD-10-CM | POA: Diagnosis not present

## 2021-04-19 DIAGNOSIS — R300 Dysuria: Secondary | ICD-10-CM | POA: Diagnosis not present

## 2021-04-19 MED ORDER — NITROFURANTOIN MACROCRYSTAL 100 MG PO CAPS: 100.0000 mg | ORAL_CAPSULE | Freq: Four times a day (QID) | ORAL | 0 refills | Status: DC

## 2021-04-19 NOTE — Patient Instructions (Signed)
  Follow-Up: At [facility], you and your health needs are our priority.  As part of our continuing mission to provide you with exceptional heart care, we have created designated Provider Care Teams.  These Care Teams include your primary Cardiologist (physician) and Advanced Practice Providers (APPs -  Physician Assistants and Nurse Practitioners) who all work together to provide you with the care you need, when you need it.  We recommend signing up for the patient portal called "MyChart".  Sign up information is provided on this After Visit Summary.  MyChart is used to connect with patients for Virtual Visits (Telemedicine).  Patients are able to view lab/test results, encounter notes, upcoming appointments, etc.  Non-urgent messages can be sent to your provider as well.   To learn more about what you can do with MyChart, go to [email or web address].    Your next appointment:   6 month(s)  The format for your next appointment:   In Person  Provider:   K. Mali Hilty, MD

## 2021-04-19 NOTE — Telephone Encounter (Signed)
Left message on cell phone that UA was positive and an antibiotic had been called in for him. That he can continue the Azo for pain and we will follow up after the culture comes back.

## 2021-04-19 NOTE — Progress Notes (Signed)
OFFICE NOTE  Chief Complaint:  Follow-up   Primary Care Physician: Alroy Dust, L.Marlou Sa, MD  HPI:  [name] is a 86 y.o. male who is a former patient of Dr. Mare Ferrari. His wife is also a patient of mine. He has a history of PVCs and is on medicine for suppression of those PVCs and also possible hypertension. Blood pressure initially 155/83 became down to 120/78. He was noted to have a few PVCs today but said he did not take his medicine this morning. Other than that he denies any medical problems. He has a history of multiple orthopedic surgeries and gallbladder surgery in the past. He has a chronic left-sided chest pain which is sharp and he can point to with his finger. This is along the left sternal border and generally does not move, worsened with exertion or is not relieved by rest. He's had 2 heart catheterizations last 5 years, neither study indicated any significant obstructive coronary disease.  11/23/2016  [name] returns today for follow-up. He denies any chest pain or worsening shortness of breath. He does have some PVCs however he is unaware of them. He is on once daily metoprolol tartrate. Blood pressure is at goal today 122/70.  05/31/2017  [name] was seen today in follow-up. He reports 2 episodes of recent chest pain. One episode was associated with stomach upset and pain that radiated up the center of the chest to his neck. He had some associated nausea and diaphoresis. He had a second episode which might of occurred after a spicy meal that was similar although he ended up vomiting after which she felt better. He denied any exercise intolerance, is able to exercise regularly, recently went on a long walking trip vacation and has no symptoms with this activity. He has a history of a false positive stress test in the past to prior cardiac catheterizations in the last 5 years which were negative.  11/14/2017  [name] comes back today for follow-up.  He is without  complaints currently.  He was having atypical chest pain symptoms which I felt more likely GI in etiology.  He ultimately was found to have bile duct stones, although had a history of prior cholecystectomy.  The stones were extracted and he has since improved significantly.  He is in fact off of his PPI medication.  Blood pressures been well controlled.  He is active and denies any chest pain or worsening shortness of breath.  05/20/2018  [name] seen today in follow-up with his wife.  He continues to be without complaints.  Since he had a bile duct stones removed he is had no further chest discomfort.  He denies any shortness of breath or chest pain.  Blood pressure is at goal.  08/16/2018  [name] returns today for follow-up.  He has had some persistent palpitations.  EKG today shows PVCs in a bigeminal pattern.  Blood pressure is well controlled on metoprolol.  He also reports some shortness of breath, making me concerned a little about of whether he has developed any cardiomyopathy related to his PVCs.  The exact burden is not clear.  11/04/2018  [name] returns today for follow-up of his PVCs.  I referred him to Dr. Curt Bears after worsening cardiomyopathy was found to have an EF around 30 to 35%.  He was found also to have a PVC burden of around 15%.  After seeing Dr. Curt Bears, he was placed on flecainide and does feel like he has  had a reduction in his PVCs.  Heart rate is in the 50s today.  Blood pressure is normal.  He is also complaining of nosebleeds.  Also he had had some superficial bleeding of the scrotum, for which she said may have been related to itching and is noted to have conjunctival hemorrhage today which is likely spontaneous.  07/11/2019  [name] returns today for follow-up.  Fortunately has had improvement in his LVEF now up to 50 to 55% with grade 2 diastolic dysfunction and some atrial enlargement by echo in September.  Symptomatically he feels much better.  In  fact he says he is back to normal now.  He maintains on flecainide for PVCs.  EKG today shows sinus bradycardia with first-degree AV block, RBBB at 52.  QTc is 465 ms.  He denies any recurrent bleeding.  He would like a refill of his albuterol.  01/16/2020  [name] is seen today in follow-up.  He recently saw Dr. Curt Bears for further evaluation of his PVCs.  They seem to be well suppressed on flecainide.  His EF has improved.  QTC today is 445 ms.  He has a right bundle branch block and first-degree AV block.  He is asymptomatic denying chest pain or worsening shortness of breath.  Blood pressure is at goal.  07/14/2020  [name] returns today for follow-up.  Recently saw Dr. Curt Bears again and seems to have had good suppression over his PVCs.  EKG has been stable with no evidence of PVCs today and a right bundle branch block at rate of 55.  Blood pressures well controlled.  He is asymptomatic.  04/20/2021  [name] is seen today in follow-up.  Unfortunately was diagnosed with metastatic prostate cancer back in April.  He has been undergoing radiation and chemotherapy for this.  He says its been fairly rough on him.  Mostly with issues on urination.  He has had no significant issues with PVCs.  He has been seen by Dr. Curt Bears and remains on flecainide.  He denies any chest pain.  No heart failure symptoms.  PMHx:  Past Medical History:  Diagnosis Date   Arthritis    Asthma    only if develops cold   Dysrhythmia    h/o pvc's   GERD (gastroesophageal reflux disease)    Kidney stone on left side    Prostate cancer (HCC)    PVC (premature ventricular contraction)    Recurrent upper respiratory infection (URI) 03/2011    Past Surgical History:  Procedure Laterality Date   APPENDECTOMY     BACK SURGERY     x 4   CARDIAC CATHETERIZATION     july 2010-minor irregl   CERVICAL SPINE SURGERY     x 2   CHOLECYSTECTOMY     2008   ERCP N/A 07/24/2017   Procedure: ENDOSCOPIC RETROGRADE  CHOLANGIOPANCREATOGRAPHY (ERCP);  Surgeon: Clarene Essex, MD;  Location: [facility];  Service: Endoscopy;  Laterality: N/A;   EYE SURGERY     bilat cataract   INCISION AND DRAINAGE HIP  07/18/2011   Procedure: IRRIGATION AND DEBRIDEMENT HIP;  Surgeon: Garald Balding, MD;  Location: [facility];  Service: Orthopedics;  Laterality: Right;  RIGHT HIP EXPLORATION, EXCISION OF DEEP BURSAL [place] STONE SURGERY  2011   R   KNEE ARTHROSCOPY     Left   PROSTATE BIOPSY     SHOULDER ARTHROSCOPY     Left   TOTAL HIP ARTHROPLASTY  Right   TOTAL HIP REVISION     Right   TOTAL KNEE ARTHROPLASTY Right 03/31/2014   Procedure: TOTAL KNEE ARTHROPLASTY;  Surgeon: Garald Balding, MD;  Location: [facility];  Service: Orthopedics;  Laterality: Right;   TOTAL KNEE ARTHROPLASTY Left 08/01/2016   Procedure: TOTAL KNEE ARTHROPLASTY;  Surgeon: Garald Balding, MD;  Location: [place];  Service: Orthopedics;  Laterality: Left;   WRIST SURGERY     bilateral, plates    FAMHx:  Family History  Problem Relation Age of Onset   Heart disease Mother    Heart attack Mother    Suicidality Mother    Throat cancer Brother    Throat cancer Paternal Aunt    Anesthesia problems Neg Hx    Hypotension Neg Hx    Malignant hyperthermia Neg Hx    Pseudochol deficiency Neg Hx    Breast cancer Neg Hx    Prostate cancer Neg Hx    Colon cancer Neg Hx    Pancreatic cancer Neg Hx     SOCHx:   reports that he has never smoked. He has never used smokeless tobacco. He reports that he does not drink alcohol and does not use drugs.  ALLERGIES:  Allergies  Allergen Reactions   Tape Other (See Comments)    Adhesive tape blisters   Erythromycin Rash   Sulfa Antibiotics Rash   Tetracyclines & Related Rash    ROS: Pertinent items noted in HPI and remainder of comprehensive ROS otherwise negative.  HOME MEDS: Current Outpatient Medications  Medication Sig Dispense Refill   albuterol (VENTOLIN HFA) 108 (90 Base)  MCG/ACT inhaler Inhale 2 puffs into the lungs every 6 (six) hours as needed for wheezing or shortness of breath. Future refills from PCP. 1 g 0   Ascorbic Acid (VITAMIN C) 500 MG tablet Take 1 tablet (500 mg total) by mouth daily. 30 tablet    degarelix (FIRMAGON) 80 MG injection Inject 80 mg into the skin every 28 (twenty-eight) days.     diclofenac sodium (VOLTAREN) 1 % GEL Apply 4 g topically 3 (three) times daily as needed (Apply to large joint up to 3 times daily as needed). 3 Tube 3   flecainide (TAMBOCOR) 100 MG tablet TAKE 1 TABLET BY MOUTH TWICE DAILY. 180 tablet 3   losartan (COZAAR) 25 MG tablet Take 1 tablet (25 mg total) by mouth daily. 90 tablet 3   losartan (COZAAR) 50 MG tablet TAKE ONE-HALF TABLET ONCE DAILY. *NOTE '50MG'$  TABLETS, '25MG'$  TABLETS ON BACKORDER. 45 tablet 0   metoprolol succinate (TOPROL-XL) 25 MG 24 hr tablet TAKE 1 TABLET ONCE DAILY. 90 tablet 3   nitrofurantoin (MACRODANTIN) 100 MG capsule Take 1 capsule (100 mg total) by mouth 4 (four) times daily. 40 capsule 0   nystatin cream (MYCOSTATIN) Apply 1 application topically 2 (two) times daily.     phenazopyridine (PYRIDIUM) 200 MG tablet Take 1 tablet (200 mg total) by mouth 3 (three) times daily as needed for pain. 30 tablet 1   potassium chloride SA (KLOR-CON) 20 MEQ tablet TAKE 1 TABLET ONCE DAILY. 90 tablet 3   tamsulosin (FLOMAX) 0.4 MG CAPS capsule Take 1 capsule (0.4 mg total) by mouth 2 (two) times daily. 60 capsule 0   No current facility-administered medications for this visit.    LABS/IMAGING: Results for orders placed or performed during the hospital encounter of 04/18/21 (from the past 48 hour(s))  Urine culture     Status: Abnormal (Preliminary result)   Collection  Time: 04/18/21  9:27 AM   Specimen: Urine, Clean Catch  Result Value Ref Range   Specimen Description      URINE, CLEAN CATCH Performed at [facility], [place] [address], [place], [place] [address]    Special  Requests      NONE Performed at [facility], [place] [address] [place], [place] [address]    Culture (A)     >=100,000 COLONIES/mL ENTEROCOCCUS FAECALIS SUSCEPTIBILITIES TO FOLLOW Performed at [facility], [place] [address], [place], [place] [address]    Report Status PENDING   Urinalysis, Complete w Microscopic     Status: Abnormal   Collection Time: 04/18/21  9:27 AM  Result Value Ref Range   Color, Urine ORANGE (A) YELLOW    Comment: BIOCHEMICALS MAY BE AFFECTED BY COLOR   APPearance HAZY (A) CLEAR   Specific Gravity, Urine 1.025 1.005 - 1.030   pH 5.5 5.0 - 8.0   Glucose, UA 250 (A) NEGATIVE mg/dL   Hgb urine dipstick MODERATE (A) NEGATIVE   Bilirubin Urine MODERATE (A) NEGATIVE   Ketones, ur NEGATIVE NEGATIVE mg/dL   Protein, ur >300 (A) NEGATIVE mg/dL   Nitrite POSITIVE (A) NEGATIVE   Leukocytes,Ua SMALL (A) NEGATIVE   Squamous Epithelial / LPF 0-5 0 - 5   WBC, UA >50 0 - 5 WBC/hpf   RBC / HPF 21-50 0 - 5 RBC/hpf   Bacteria, UA FEW (A) NONE SEEN    Comment: Performed at [facility], [place] [address], [place], [place] [address]   No results found.  WEIGHTS: Wt Readings from Last 3 Encounters:  04/19/21 237 lb (107.5 kg)  02/01/21 235 lb 3.2 oz (106.7 kg)  01/19/21 236 lb (107 kg)    VITALS: BP 120/60 (BP Location: Right Arm)   Pulse 64   Ht '6\' 4"'$  (1.93 m)   Wt 237 lb (107.5 kg)   SpO2 94%   BMI 28.85 kg/m   EXAM: General appearance: alert and no distress Neck: no carotid bruit and no JVD Lungs: clear to auscultation bilaterally Heart: regular rate and rhythm, S1, S2 normal, no murmur, click, rub or gallop Abdomen: soft, non-tender; bowel sounds normal; no masses,  no organomegaly Extremities: extremities normal, atraumatic, no cyanosis or edema Pulses: 2+ and symmetric Skin: Skin color, texture, turgor normal. 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   [name] seems to be doing well from a cardiac standpoint with generally good suppression of his PVCs.  His LVEF had near normalized in September 2020.  He denies any shortness of breath.  Unfortunate was diagnosed with prostate cancer which is metastatic.  He is undergoing radiation therapy and chemotherapy.  Follow-up 6 months.  Pixie Casino, MD, [facility], [place] Director of the Advanced Lipid Disorders &  Cardiovascular Risk Reduction Clinic Diplomate of the American Board of Clinical Lipidology Attending Cardiologist  Direct Dial: [phone]  Fax: [phone]  Website:  [email or web address] 04/19/2021, 4:15 PM

## 2021-04-20 ENCOUNTER — Telehealth: Payer: Self-pay

## 2021-04-20 ENCOUNTER — Other Ambulatory Visit: Payer: Self-pay

## 2021-04-20 ENCOUNTER — Ambulatory Visit
Admission: RE | Admit: 2021-04-20 | Discharge: 2021-04-20 | Disposition: A | Discharge status: Home / Self Care | Payer: Medicare Other | Source: Ambulatory Visit | Attending: Radiation Oncology | Admitting: Radiation Oncology

## 2021-04-20 DIAGNOSIS — Z510 Encounter for antineoplastic radiation therapy: Secondary | ICD-10-CM | POA: Diagnosis not present

## 2021-04-20 DIAGNOSIS — C61 Malignant neoplasm of prostate: Secondary | ICD-10-CM | POA: Diagnosis not present

## 2021-04-20 DIAGNOSIS — R300 Dysuria: Secondary | ICD-10-CM | POA: Diagnosis not present

## 2021-04-20 LAB — URINE CULTURE: Culture: >=100000 — AB

## 2021-04-20 NOTE — Telephone Encounter (Signed)
-----   Message from Freeman Caldron, [place] sent at 04/20/2021 12:45 PM EDT ----- Regarding: Please call patient with results When you have a minute, will you please call this patient to let him know that he is on the correct antibiotic and should complete the antibiotic as prescribed.  If he does not have significant improvement at the completion of the antibiotic, I would recommend that he follow-up with his urologist for repeat urinalysis and further recommendations. Thank you!!!!! -Ashlyn ----- Message ----- From: Gery Pray, MD Sent: 04/20/2021  12:39 PM EDT To: Freeman Caldron, PA-C  Ashlyn,      Please see enclosed culture results.  Looks like you picked the right antibiotic!  Thanks, jk ----- Message ----- From: Buel Ream, [facility] [place] Sent: 04/18/2021  10:22 AM EDT To: Gery Pray, MD

## 2021-04-20 NOTE — Telephone Encounter (Signed)
Spoke with patient and relayed Ashlyn, PA-C recommendations. Patient verbalized understanding and agreement. Denied any other needs at this time, but knows to call clinic back should he have any other questions/concerns

## 2021-04-21 ENCOUNTER — Ambulatory Visit
Admission: RE | Admit: 2021-04-21 | Discharge: 2021-04-21 | Disposition: A | Discharge status: Home / Self Care | Payer: Medicare Other | Source: Ambulatory Visit | Attending: Radiation Oncology | Admitting: Radiation Oncology

## 2021-04-21 DIAGNOSIS — R300 Dysuria: Secondary | ICD-10-CM | POA: Diagnosis not present

## 2021-04-21 DIAGNOSIS — C61 Malignant neoplasm of prostate: Secondary | ICD-10-CM | POA: Diagnosis not present

## 2021-04-21 DIAGNOSIS — Z510 Encounter for antineoplastic radiation therapy: Secondary | ICD-10-CM | POA: Diagnosis not present

## 2021-04-22 ENCOUNTER — Other Ambulatory Visit: Payer: Self-pay

## 2021-04-22 ENCOUNTER — Ambulatory Visit
Admission: RE | Admit: 2021-04-22 | Discharge: 2021-04-22 | Disposition: A | Discharge status: Home / Self Care | Payer: Medicare Other | Source: Ambulatory Visit | Attending: Radiation Oncology | Admitting: Radiation Oncology

## 2021-04-22 DIAGNOSIS — C61 Malignant neoplasm of prostate: Secondary | ICD-10-CM | POA: Diagnosis not present

## 2021-04-22 DIAGNOSIS — R300 Dysuria: Secondary | ICD-10-CM | POA: Diagnosis not present

## 2021-04-22 DIAGNOSIS — Z510 Encounter for antineoplastic radiation therapy: Secondary | ICD-10-CM | POA: Diagnosis not present

## 2021-04-22 LAB — URINALYSIS, COMPLETE (UACMP) WITH MICROSCOPIC
Bilirubin Urine: NEGATIVE
Glucose, UA: NEGATIVE mg/dL
Hgb urine dipstick: NEGATIVE
Ketones, ur: NEGATIVE mg/dL
Leukocytes,Ua: NEGATIVE
Nitrite: POSITIVE — AB
Protein, ur: 100 mg/dL — AB
RBC / HPF: >50 RBC/hpf — ABNORMAL HIGH (ref 0–5)
Specific Gravity, Urine: 1.016 (ref 1.005–1.030)
pH: 6 (ref 5.0–8.0)

## 2021-04-23 LAB — URINE CULTURE: Culture: NO GROWTH

## 2021-04-25 ENCOUNTER — Ambulatory Visit
Admission: RE | Admit: 2021-04-25 | Discharge: 2021-04-25 | Disposition: A | Discharge status: Home / Self Care | Payer: Medicare Other | Source: Ambulatory Visit | Attending: Radiation Oncology | Admitting: Radiation Oncology

## 2021-04-25 ENCOUNTER — Other Ambulatory Visit: Payer: Self-pay | Admitting: Internal Medicine

## 2021-04-25 ENCOUNTER — Telehealth: Payer: Self-pay

## 2021-04-25 ENCOUNTER — Other Ambulatory Visit: Payer: Self-pay

## 2021-04-25 DIAGNOSIS — Z510 Encounter for antineoplastic radiation therapy: Secondary | ICD-10-CM | POA: Diagnosis not present

## 2021-04-25 DIAGNOSIS — R300 Dysuria: Secondary | ICD-10-CM | POA: Diagnosis not present

## 2021-04-25 DIAGNOSIS — C61 Malignant neoplasm of prostate: Secondary | ICD-10-CM | POA: Diagnosis not present

## 2021-04-25 NOTE — Telephone Encounter (Signed)
-----   Message from Tyler Pita, MD sent at 04/25/2021 11:12 AM EDT ----- Please call patient, Urine improved with antibiotics and culture now negative. MM

## 2021-04-25 NOTE — Telephone Encounter (Signed)
Called patient to relay update on UA/culture from last Friday. Patient verbalized understanding and appreciation of call. No other needs identified at this time

## 2021-04-26 ENCOUNTER — Ambulatory Visit
Admission: RE | Admit: 2021-04-26 | Discharge: 2021-04-26 | Disposition: A | Discharge status: Home / Self Care | Payer: Medicare Other | Source: Ambulatory Visit | Attending: Radiation Oncology | Admitting: Radiation Oncology

## 2021-04-26 DIAGNOSIS — R300 Dysuria: Secondary | ICD-10-CM | POA: Diagnosis not present

## 2021-04-26 DIAGNOSIS — C61 Malignant neoplasm of prostate: Secondary | ICD-10-CM | POA: Diagnosis not present

## 2021-04-26 DIAGNOSIS — Z510 Encounter for antineoplastic radiation therapy: Secondary | ICD-10-CM | POA: Diagnosis not present

## 2021-04-27 ENCOUNTER — Ambulatory Visit
Admission: RE | Admit: 2021-04-27 | Discharge: 2021-04-27 | Disposition: A | Discharge status: Home / Self Care | Payer: Medicare Other | Source: Ambulatory Visit | Attending: Radiation Oncology | Admitting: Radiation Oncology

## 2021-04-27 ENCOUNTER — Other Ambulatory Visit: Payer: Self-pay

## 2021-04-27 DIAGNOSIS — R300 Dysuria: Secondary | ICD-10-CM | POA: Diagnosis not present

## 2021-04-27 DIAGNOSIS — Z510 Encounter for antineoplastic radiation therapy: Secondary | ICD-10-CM | POA: Diagnosis not present

## 2021-04-27 DIAGNOSIS — C61 Malignant neoplasm of prostate: Secondary | ICD-10-CM | POA: Diagnosis not present

## 2021-04-28 ENCOUNTER — Ambulatory Visit: Payer: Medicare Other

## 2021-04-28 ENCOUNTER — Ambulatory Visit
Admission: RE | Admit: 2021-04-28 | Discharge: 2021-04-28 | Disposition: A | Discharge status: Home / Self Care | Payer: Medicare Other | Source: Ambulatory Visit | Attending: Radiation Oncology | Admitting: Radiation Oncology

## 2021-04-28 DIAGNOSIS — R300 Dysuria: Secondary | ICD-10-CM | POA: Diagnosis not present

## 2021-04-28 DIAGNOSIS — Z510 Encounter for antineoplastic radiation therapy: Secondary | ICD-10-CM | POA: Diagnosis not present

## 2021-04-28 DIAGNOSIS — C61 Malignant neoplasm of prostate: Secondary | ICD-10-CM | POA: Diagnosis not present

## 2021-04-29 ENCOUNTER — Other Ambulatory Visit: Payer: Self-pay

## 2021-04-29 ENCOUNTER — Ambulatory Visit
Admission: RE | Admit: 2021-04-29 | Discharge: 2021-04-29 | Disposition: A | Discharge status: Home / Self Care | Payer: Medicare Other | Source: Ambulatory Visit | Attending: Radiation Oncology | Admitting: Radiation Oncology

## 2021-04-29 ENCOUNTER — Ambulatory Visit: Payer: Medicare Other

## 2021-04-29 DIAGNOSIS — R300 Dysuria: Secondary | ICD-10-CM | POA: Diagnosis not present

## 2021-04-29 DIAGNOSIS — C61 Malignant neoplasm of prostate: Secondary | ICD-10-CM | POA: Diagnosis not present

## 2021-04-29 DIAGNOSIS — Z510 Encounter for antineoplastic radiation therapy: Secondary | ICD-10-CM | POA: Diagnosis not present

## 2021-05-02 ENCOUNTER — Other Ambulatory Visit: Payer: Self-pay

## 2021-05-02 ENCOUNTER — Ambulatory Visit
Admission: RE | Admit: 2021-05-02 | Discharge: 2021-05-02 | Disposition: A | Discharge status: Home / Self Care | Payer: Medicare Other | Source: Ambulatory Visit | Attending: Radiation Oncology | Admitting: Radiation Oncology

## 2021-05-02 DIAGNOSIS — C61 Malignant neoplasm of prostate: Secondary | ICD-10-CM | POA: Diagnosis not present

## 2021-05-02 DIAGNOSIS — Z510 Encounter for antineoplastic radiation therapy: Secondary | ICD-10-CM | POA: Diagnosis not present

## 2021-05-02 DIAGNOSIS — R300 Dysuria: Secondary | ICD-10-CM | POA: Diagnosis not present

## 2021-05-03 ENCOUNTER — Ambulatory Visit
Admission: RE | Admit: 2021-05-03 | Discharge: 2021-05-03 | Disposition: A | Discharge status: Home / Self Care | Payer: Medicare Other | Source: Ambulatory Visit | Attending: Radiation Oncology | Admitting: Radiation Oncology

## 2021-05-03 DIAGNOSIS — Z510 Encounter for antineoplastic radiation therapy: Secondary | ICD-10-CM | POA: Diagnosis not present

## 2021-05-03 DIAGNOSIS — C61 Malignant neoplasm of prostate: Secondary | ICD-10-CM | POA: Diagnosis not present

## 2021-05-03 DIAGNOSIS — R300 Dysuria: Secondary | ICD-10-CM | POA: Diagnosis not present

## 2021-05-04 ENCOUNTER — Ambulatory Visit
Admission: RE | Admit: 2021-05-04 | Discharge: 2021-05-04 | Disposition: A | Discharge status: Home / Self Care | Payer: Medicare Other | Source: Ambulatory Visit | Attending: Radiation Oncology | Admitting: Radiation Oncology

## 2021-05-04 ENCOUNTER — Other Ambulatory Visit: Payer: Self-pay

## 2021-05-04 DIAGNOSIS — C61 Malignant neoplasm of prostate: Secondary | ICD-10-CM | POA: Diagnosis not present

## 2021-05-04 DIAGNOSIS — R300 Dysuria: Secondary | ICD-10-CM | POA: Diagnosis not present

## 2021-05-04 DIAGNOSIS — Z510 Encounter for antineoplastic radiation therapy: Secondary | ICD-10-CM | POA: Diagnosis not present

## 2021-05-05 ENCOUNTER — Ambulatory Visit
Admission: RE | Admit: 2021-05-05 | Discharge: 2021-05-05 | Disposition: A | Discharge status: Home / Self Care | Payer: Medicare Other | Source: Ambulatory Visit | Attending: Radiation Oncology | Admitting: Radiation Oncology

## 2021-05-05 DIAGNOSIS — R300 Dysuria: Secondary | ICD-10-CM | POA: Insufficient documentation

## 2021-05-05 DIAGNOSIS — C61 Malignant neoplasm of prostate: Secondary | ICD-10-CM | POA: Diagnosis not present

## 2021-05-05 DIAGNOSIS — Z510 Encounter for antineoplastic radiation therapy: Secondary | ICD-10-CM | POA: Insufficient documentation

## 2021-05-06 ENCOUNTER — Other Ambulatory Visit: Payer: Self-pay

## 2021-05-06 ENCOUNTER — Ambulatory Visit
Admission: RE | Admit: 2021-05-06 | Discharge: 2021-05-06 | Disposition: A | Discharge status: Home / Self Care | Payer: Medicare Other | Source: Ambulatory Visit | Attending: Radiation Oncology | Admitting: Radiation Oncology

## 2021-05-06 DIAGNOSIS — C61 Malignant neoplasm of prostate: Secondary | ICD-10-CM | POA: Diagnosis not present

## 2021-05-06 DIAGNOSIS — Z510 Encounter for antineoplastic radiation therapy: Secondary | ICD-10-CM | POA: Diagnosis not present

## 2021-05-06 DIAGNOSIS — R300 Dysuria: Secondary | ICD-10-CM | POA: Diagnosis not present

## 2021-05-10 ENCOUNTER — Ambulatory Visit: Payer: Medicare Other

## 2021-05-11 ENCOUNTER — Ambulatory Visit
Admission: RE | Admit: 2021-05-11 | Discharge: 2021-05-11 | Disposition: A | Discharge status: Home / Self Care | Payer: Medicare Other | Source: Ambulatory Visit | Attending: Radiation Oncology | Admitting: Radiation Oncology

## 2021-05-11 DIAGNOSIS — Z510 Encounter for antineoplastic radiation therapy: Secondary | ICD-10-CM | POA: Diagnosis not present

## 2021-05-11 DIAGNOSIS — C61 Malignant neoplasm of prostate: Secondary | ICD-10-CM | POA: Diagnosis not present

## 2021-05-11 DIAGNOSIS — R300 Dysuria: Secondary | ICD-10-CM | POA: Diagnosis not present

## 2021-05-12 ENCOUNTER — Ambulatory Visit
Admission: RE | Admit: 2021-05-12 | Discharge: 2021-05-12 | Disposition: A | Discharge status: Home / Self Care | Payer: Medicare Other | Source: Ambulatory Visit | Attending: Radiation Oncology | Admitting: Radiation Oncology

## 2021-05-12 ENCOUNTER — Other Ambulatory Visit: Payer: Self-pay

## 2021-05-12 DIAGNOSIS — C61 Malignant neoplasm of prostate: Secondary | ICD-10-CM | POA: Diagnosis not present

## 2021-05-12 DIAGNOSIS — Z510 Encounter for antineoplastic radiation therapy: Secondary | ICD-10-CM | POA: Diagnosis not present

## 2021-05-12 DIAGNOSIS — R300 Dysuria: Secondary | ICD-10-CM | POA: Diagnosis not present

## 2021-05-13 ENCOUNTER — Ambulatory Visit
Admission: RE | Admit: 2021-05-13 | Discharge: 2021-05-13 | Disposition: A | Discharge status: Home / Self Care | Payer: Medicare Other | Source: Ambulatory Visit | Attending: Radiation Oncology | Admitting: Radiation Oncology

## 2021-05-13 DIAGNOSIS — C61 Malignant neoplasm of prostate: Secondary | ICD-10-CM | POA: Diagnosis not present

## 2021-05-13 DIAGNOSIS — R300 Dysuria: Secondary | ICD-10-CM | POA: Diagnosis not present

## 2021-05-13 DIAGNOSIS — Z510 Encounter for antineoplastic radiation therapy: Secondary | ICD-10-CM | POA: Diagnosis not present

## 2021-05-16 ENCOUNTER — Ambulatory Visit
Admission: RE | Admit: 2021-05-16 | Discharge: 2021-05-16 | Disposition: A | Discharge status: Home / Self Care | Payer: Medicare Other | Source: Ambulatory Visit | Attending: Radiation Oncology | Admitting: Radiation Oncology

## 2021-05-16 ENCOUNTER — Other Ambulatory Visit: Payer: Self-pay

## 2021-05-16 DIAGNOSIS — Z510 Encounter for antineoplastic radiation therapy: Secondary | ICD-10-CM | POA: Diagnosis not present

## 2021-05-16 DIAGNOSIS — C61 Malignant neoplasm of prostate: Secondary | ICD-10-CM | POA: Diagnosis not present

## 2021-05-16 DIAGNOSIS — R300 Dysuria: Secondary | ICD-10-CM | POA: Diagnosis not present

## 2021-05-17 ENCOUNTER — Ambulatory Visit
Admission: RE | Admit: 2021-05-17 | Discharge: 2021-05-17 | Disposition: A | Discharge status: Home / Self Care | Payer: Medicare Other | Source: Ambulatory Visit | Attending: Radiation Oncology | Admitting: Radiation Oncology

## 2021-05-17 DIAGNOSIS — R300 Dysuria: Secondary | ICD-10-CM | POA: Diagnosis not present

## 2021-05-17 DIAGNOSIS — Z510 Encounter for antineoplastic radiation therapy: Secondary | ICD-10-CM | POA: Diagnosis not present

## 2021-05-17 DIAGNOSIS — R3914 Feeling of incomplete bladder emptying: Secondary | ICD-10-CM | POA: Diagnosis not present

## 2021-05-17 DIAGNOSIS — C61 Malignant neoplasm of prostate: Secondary | ICD-10-CM | POA: Diagnosis not present

## 2021-05-17 DIAGNOSIS — Z5111 Encounter for antineoplastic chemotherapy: Secondary | ICD-10-CM | POA: Diagnosis not present

## 2021-05-18 ENCOUNTER — Ambulatory Visit
Admission: RE | Admit: 2021-05-18 | Discharge: 2021-05-18 | Disposition: A | Discharge status: Home / Self Care | Payer: Medicare Other | Source: Ambulatory Visit | Attending: Radiation Oncology | Admitting: Radiation Oncology

## 2021-05-18 ENCOUNTER — Other Ambulatory Visit: Payer: Self-pay

## 2021-05-18 DIAGNOSIS — Z510 Encounter for antineoplastic radiation therapy: Secondary | ICD-10-CM | POA: Diagnosis not present

## 2021-05-18 DIAGNOSIS — R300 Dysuria: Secondary | ICD-10-CM | POA: Diagnosis not present

## 2021-05-18 DIAGNOSIS — C61 Malignant neoplasm of prostate: Secondary | ICD-10-CM | POA: Diagnosis not present

## 2021-05-19 ENCOUNTER — Ambulatory Visit: Payer: Medicare Other

## 2021-05-19 ENCOUNTER — Ambulatory Visit
Admission: RE | Admit: 2021-05-19 | Discharge: 2021-05-19 | Disposition: A | Discharge status: Home / Self Care | Payer: Medicare Other | Source: Ambulatory Visit | Attending: Radiation Oncology | Admitting: Radiation Oncology

## 2021-05-19 DIAGNOSIS — R300 Dysuria: Secondary | ICD-10-CM | POA: Diagnosis not present

## 2021-05-19 DIAGNOSIS — C61 Malignant neoplasm of prostate: Secondary | ICD-10-CM | POA: Diagnosis not present

## 2021-05-19 DIAGNOSIS — Z510 Encounter for antineoplastic radiation therapy: Secondary | ICD-10-CM | POA: Diagnosis not present

## 2021-05-20 ENCOUNTER — Other Ambulatory Visit: Payer: Self-pay

## 2021-05-20 ENCOUNTER — Ambulatory Visit: Payer: Medicare Other

## 2021-05-20 DIAGNOSIS — R300 Dysuria: Secondary | ICD-10-CM | POA: Diagnosis not present

## 2021-05-20 DIAGNOSIS — Z510 Encounter for antineoplastic radiation therapy: Secondary | ICD-10-CM | POA: Diagnosis not present

## 2021-05-20 DIAGNOSIS — C61 Malignant neoplasm of prostate: Secondary | ICD-10-CM | POA: Diagnosis not present

## 2021-05-23 ENCOUNTER — Encounter: Payer: Self-pay | Admitting: Urology

## 2021-05-23 ENCOUNTER — Other Ambulatory Visit: Payer: Self-pay

## 2021-05-23 ENCOUNTER — Ambulatory Visit: Payer: Medicare Other

## 2021-05-23 DIAGNOSIS — C61 Malignant neoplasm of prostate: Secondary | ICD-10-CM

## 2021-05-23 DIAGNOSIS — R300 Dysuria: Secondary | ICD-10-CM | POA: Diagnosis not present

## 2021-05-23 DIAGNOSIS — Z510 Encounter for antineoplastic radiation therapy: Secondary | ICD-10-CM | POA: Diagnosis not present

## 2021-05-26 ENCOUNTER — Other Ambulatory Visit: Payer: Self-pay | Admitting: Radiation Oncology

## 2021-05-26 DIAGNOSIS — N3000 Acute cystitis without hematuria: Secondary | ICD-10-CM | POA: Diagnosis not present

## 2021-05-26 DIAGNOSIS — N200 Calculus of kidney: Secondary | ICD-10-CM | POA: Diagnosis not present

## 2021-05-26 DIAGNOSIS — N210 Calculus in bladder: Secondary | ICD-10-CM | POA: Diagnosis not present

## 2021-05-26 DIAGNOSIS — C61 Malignant neoplasm of prostate: Secondary | ICD-10-CM | POA: Diagnosis not present

## 2021-05-26 DIAGNOSIS — K5730 Diverticulosis of large intestine without perforation or abscess without bleeding: Secondary | ICD-10-CM | POA: Diagnosis not present

## 2021-05-26 DIAGNOSIS — R3915 Urgency of urination: Secondary | ICD-10-CM | POA: Diagnosis not present

## 2021-05-26 DIAGNOSIS — N201 Calculus of ureter: Secondary | ICD-10-CM | POA: Diagnosis not present

## 2021-05-26 DIAGNOSIS — I700 Atherosclerosis of aorta: Secondary | ICD-10-CM | POA: Diagnosis not present

## 2021-05-26 MED ORDER — TAMSULOSIN HCL 0.4 MG PO CAPS: 0.4000 mg | ORAL_CAPSULE | Freq: Two times a day (BID) | ORAL | 0 refills | Status: DC

## 2021-06-02 ENCOUNTER — Other Ambulatory Visit (HOSPITAL_COMMUNITY): Payer: Self-pay | Admitting: Urology

## 2021-06-02 DIAGNOSIS — C61 Malignant neoplasm of prostate: Secondary | ICD-10-CM

## 2021-06-08 DIAGNOSIS — N3000 Acute cystitis without hematuria: Secondary | ICD-10-CM | POA: Diagnosis not present

## 2021-06-08 DIAGNOSIS — R3914 Feeling of incomplete bladder emptying: Secondary | ICD-10-CM | POA: Diagnosis not present

## 2021-06-08 DIAGNOSIS — N201 Calculus of ureter: Secondary | ICD-10-CM | POA: Diagnosis not present

## 2021-06-16 ENCOUNTER — Other Ambulatory Visit: Payer: Self-pay

## 2021-06-16 ENCOUNTER — Encounter (HOSPITAL_COMMUNITY)
Admission: RE | Admit: 2021-06-16 | Discharge: 2021-06-16 | Disposition: A | Discharge status: Home / Self Care | Payer: Medicare Other | Source: Ambulatory Visit | Attending: Urology | Admitting: Urology

## 2021-06-16 DIAGNOSIS — C61 Malignant neoplasm of prostate: Secondary | ICD-10-CM | POA: Diagnosis not present

## 2021-06-16 DIAGNOSIS — N210 Calculus in bladder: Secondary | ICD-10-CM | POA: Diagnosis not present

## 2021-06-16 DIAGNOSIS — N200 Calculus of kidney: Secondary | ICD-10-CM | POA: Diagnosis not present

## 2021-06-16 DIAGNOSIS — I2510 Atherosclerotic heart disease of native coronary artery without angina pectoris: Secondary | ICD-10-CM | POA: Diagnosis not present

## 2021-06-16 MED ORDER — PIFLIFOLASTAT F 18 (PYLARIFY) INJECTION
9.0000 | Freq: Once | INTRAVENOUS | Status: AC
  Administered 2021-06-16: 9 via INTRAVENOUS

## 2021-06-20 ENCOUNTER — Other Ambulatory Visit: Payer: Self-pay | Admitting: Cardiology

## 2021-06-23 DIAGNOSIS — C7951 Secondary malignant neoplasm of bone: Secondary | ICD-10-CM | POA: Diagnosis not present

## 2021-06-23 DIAGNOSIS — N3000 Acute cystitis without hematuria: Secondary | ICD-10-CM | POA: Diagnosis not present

## 2021-06-23 DIAGNOSIS — N210 Calculus in bladder: Secondary | ICD-10-CM | POA: Diagnosis not present

## 2021-06-23 DIAGNOSIS — R3914 Feeling of incomplete bladder emptying: Secondary | ICD-10-CM | POA: Diagnosis not present

## 2021-06-23 DIAGNOSIS — C61 Malignant neoplasm of prostate: Secondary | ICD-10-CM | POA: Diagnosis not present

## 2021-06-23 DIAGNOSIS — C775 Secondary and unspecified malignant neoplasm of intrapelvic lymph nodes: Secondary | ICD-10-CM | POA: Diagnosis not present

## 2021-06-27 ENCOUNTER — Encounter: Payer: Self-pay | Admitting: Urology

## 2021-06-27 NOTE — Progress Notes (Signed)
Spouse Madison Albea cleared to speak w/ me in reference to patient's status. She reports pain 10/10 when passing bladder stones, insomnia, and fatigue.  I-PSS Score of 5 (mild).  Meaningful use complete.  Flomax 0.4mg  as directed and urology follow-up scheduled for December 29th, 2023 -per [facility].  [place] notified of patient's 10:00am-06/29/21 telephone appointment and verbalized understanding.

## 2021-06-29 ENCOUNTER — Telehealth: Payer: Self-pay | Admitting: Oncology

## 2021-06-29 ENCOUNTER — Ambulatory Visit
Admission: RE | Admit: 2021-06-29 | Discharge: 2021-06-29 | Disposition: A | Discharge status: Home / Self Care | Payer: Medicare Other | Source: Ambulatory Visit | Attending: Urology | Admitting: Urology

## 2021-06-29 DIAGNOSIS — C61 Malignant neoplasm of prostate: Secondary | ICD-10-CM

## 2021-06-29 NOTE — Progress Notes (Signed)
  Radiation Oncology         [phone] ________________________________  Name: [name] MRN: [id number]  Date: 05/23/2021  DOB: 1935-03-31  End of Treatment Note  Diagnosis:   86 y/o male with Stage T2c adenocarcinoma of the prostate with Gleason score of 5+4, and PSA of 16.44 at diagnosis.     Indication for treatment:  Curative, Definitive Radiotherapy       Radiation treatment dates:   03/24/21 - 05/23/21  Site/dose:  1. The prostate, seminal vesicles, and pelvic lymph nodes were initially treated to 45 Gy in 25 fractions of 1.8 Gy  2. The prostate only was boosted to 75 Gy with 15 additional fractions of 2.0 Gy   Beams/energy:  1. The prostate, seminal vesicles, and pelvic lymph nodes were initially treated using VMAT intensity modulated radiotherapy delivering 6 megavolt photons. Image guidance was performed with CB-CT studies prior to each fraction. He was immobilized with a body fix lower extremity mold.  2. the prostate only was boosted using VMAT intensity modulated radiotherapy delivering 6 megavolt photons. Image guidance was performed with CB-CT studies prior to each fraction. He was immobilized with a body fix lower extremity mold.  Narrative: The patient tolerated radiation treatment relatively well with some minor urinary irritation and modest fatigue.  He did have 2 UTIs that were treated during the course of his radiation and he ended up passing a large stone near the end of his treatment with some associated gross hematuria that has since resolved.  He reported increased frequency, urgency, nocturia 7-8 times per night, dysuria and a weak flow of stream despite taking Flomax twice daily.  He also reported some loose stool/diarrhea.  Plan: The patient has completed radiation treatment. He will return to radiation oncology clinic for routine followup in one month. I advised him to call or return sooner if he has any questions or concerns related to his recovery or  treatment. ________________________________  Sheral Apley. Tammi Klippel, M.D.

## 2021-06-29 NOTE — Progress Notes (Signed)
Radiation Oncology         [phone]) [phone] ________________________________  Name: [name] MRN: [id number]  Date: 06/29/2021  DOB: November 01, 1934  Post Treatment Note  CC: Alroy Dust, L.Marlou Sa, MD  Lucas Mallow, MD  Diagnosis:   86 y.o. gentleman with newly discovered diffuse osseous metastases secondary to Stage T2c adenocarcinoma of the prostate with Gleason score of 5+4, and PSA of 16.44 at diagnosis.  Interval Since Last Radiation:  5 weeks  03/24/21 - 05/23/21: 1. The prostate, seminal vesicles, and pelvic lymph nodes were initially treated to 45 Gy in 25 fractions of 1.8 Gy  2. The prostate only was boosted to 75 Gy with 15 additional fractions of 2.0 Gy    Narrative:  I spoke with the patient to conduct his routine scheduled 1 month follow up visit via telephone to spare the patient unnecessary potential exposure in the healthcare setting during the current COVID-19 pandemic.  The patient was notified in advance and gave permission to proceed with this visit format.  He tolerated radiation treatment relatively well with some minor urinary irritation and modest fatigue.  He did have 2 UTIs that were treated during the course of his radiation and he ended up passing a large stone near the end of his treatment with some associated gross hematuria that has since resolved.  He reported increased frequency, urgency, nocturia 7-8 times per night, dysuria and a weak flow of stream despite taking Flomax twice daily.  He also reported some loose stool/diarrhea.                              On review of systems, the patient states that he is doing well in general.  Unfortunately, he has continued to pass numerous bladder stones since he completed treatment and has had several associated UTIs, likely secondary to bladder stones. He had a CT Urogram at [facility] in 05/2021 for further evaluation of stone burden due to the persistent LUTS and passing of stones and it was at that time that he was noted to  have osseous metastatic disease, prompting a PSMA PET scan. The PSMA PET scan was performed 06/16/21 and shows diffuse, osseous metastatic disease.  Fortunately, he denies any site-specific pain associated with the bony metastases.  He has however, continued to struggle with passage of numerous bladder stones resulting in discomfort and increased LUTS.  When we initially met with him in consultation in May 2022, given his high volume, high risk disease, he had had disease staging with CT A/P and bone scan on 01/04/2021.  The CT A/P was without evidence of metastatic disease but on bone bone scan, there were scattered foci of uptake in bilateral ribs, not typical for trauma so the recommendation was for further evaluation with a chest CT.  There was also some increased uptake in the cervicothoracic junction felt most likely to be degenerative but could not rule out metastatic disease.   He was started on Firmagon on 01/07/21, with plans for a 70-monthLupron injection in June 2022.  A CT chest was performed on 01/14/2021 for further evaluation of the abnormal findings on bone scan and this was without any evidence of osseous or soft tissue metastatic disease in the chest.  Given the new findings of diffuse osseous metastatic disease on his recent PSMA PET scan, [facility] with Dr. MTresa Moorerecently.  He is scheduled to start Xgeva in the near future and is considering seeking a second opinion with urology at [facility].  ALLERGIES:  is allergic to tape, erythromycin, sulfa antibiotics, and tetracyclines & related.  Meds: Current Outpatient Medications  Medication Sig Dispense Refill   albuterol (VENTOLIN HFA) 108 (90 Base) MCG/ACT inhaler Inhale 2 puffs into the lungs every 6 (six) hours as needed for wheezing or shortness of breath. Future refills from PCP. 1 g 0   Ascorbic Acid (VITAMIN C) 500 MG tablet Take 1 tablet (500 mg total) by mouth  daily. 30 tablet    degarelix (FIRMAGON) 80 MG injection Inject 80 mg into the skin every 28 (twenty-eight) days.     diclofenac sodium (VOLTAREN) 1 % GEL Apply 4 g topically 3 (three) times daily as needed (Apply to large joint up to 3 times daily as needed). 3 Tube 3   flecainide (TAMBOCOR) 100 MG tablet TAKE 1 TABLET BY MOUTH TWICE DAILY. 180 tablet 3   losartan (COZAAR) 25 MG tablet Take 1 tablet (25 mg total) by mouth daily. 90 tablet 3   losartan (COZAAR) 25 MG tablet TAKE ONE TABLET BY MOUTH ONCE DAILY. 90 tablet 0   metoprolol succinate (TOPROL-XL) 25 MG 24 hr tablet TAKE 1 TABLET ONCE DAILY. 90 tablet 3   nitrofurantoin (MACRODANTIN) 100 MG capsule Take 1 capsule (100 mg total) by mouth 4 (four) times daily. 40 capsule 0   nystatin cream (MYCOSTATIN) Apply 1 application topically 2 (two) times daily.     phenazopyridine (PYRIDIUM) 200 MG tablet Take 1 tablet (200 mg total) by mouth 3 (three) times daily as needed for pain. 30 tablet 1   potassium chloride SA (KLOR-CON) 20 MEQ tablet TAKE 1 TABLET ONCE DAILY. 90 tablet 3   tamsulosin (FLOMAX) 0.4 MG CAPS capsule Take 1 capsule (0.4 mg total) by mouth 2 (two) times daily. 60 capsule 0   No current facility-administered medications for this encounter.    Physical Findings:  vitals were not taken for this visit.  Pain Assessment Pain Score: 0-No pain/10 Unable to assess due to telephone follow-up visit format.  Lab Findings: Lab Results  Component Value Date   WBC 6.1 11/04/2018   HGB 16.7 11/04/2018   HCT 49.4 11/04/2018   MCV 92 11/04/2018   PLT 159 11/04/2018     Radiographic Findings: NM PET (PSMA) SKULL TO MID THIGH  Result Date: 06/19/2021 CLINICAL DATA:  Prostate cancer. Radiation therapy completed 05/22/2021. PSA of 5.2 EXAM: NUCLEAR MEDICINE PET SKULL BASE TO THIGH TECHNIQUE: 9.0 mCi F18 Piflufolastat (Pylarify) was injected intravenously. Full-ring PET imaging was performed from the skull base to thigh after the  radiotracer. CT data was obtained and used for attenuation correction and anatomic localization. COMPARISON:  CT chest from [facility] 01/14/2021. CT urogram from [facility] 05/26/2021. Bone scan of 01/04/2021 FINDINGS: NECK No radiotracer activity in neck lymph nodes. Incidental CT finding: No cervical adenopathy. CHEST No radiotracer accumulation within mediastinal or hilar lymph nodes. Incidental CT finding: Aortic atherosclerosis. Mild cardiomegaly. Left main and 3 vessel coronary artery calcification. ABDOMEN/PELVIS Prostate: Multifocal prostatic tracer affinity. At the base, this is positioned eccentric right at a S.U.V. max of 10.7. In the mid to apical gland, this is bilateral and paramidline at a S.U.V. max of 15.6. Lymph nodes: No tracer avid abdominal nodes. A left external iliac 4 mm node demonstrates mild tracer uptake at a S.U.V. max of 1.8 on 201/4. Liver: No evidence of liver  metastasis Incidental CT finding: Deferred to recent diagnostic CT. Pneumobilia. Normal adrenal glands. Punctate renal collecting system calculi. Bladder calculi. Fat containing left inguinal hernia. Bilateral renal low-density lesions which are likely cysts. An interpolar right renal complex 8 mm lesion on 163/4. SKELETON Widespread tracer avid osseous metastasis. Example within the posterior right iliac at a S.U.V. max of 13.3 on 187/4. Within the anterior L1 vertebral body at a S.U.V. max of 11.4. IMPRESSION: 1. Widespread, relatively diffuse osseous metastasis. 2. Multifocal prostatic tracer uptake, favoring residual viable primary. 3. Isolated left external iliac tiny node with low-level tracer uptake, suspicious for nodal metastasis. 4. Incidental findings, including: Bladder and renal calculi. Complex 8 mm interpolar right renal lesion, indeterminate. Coronary artery atherosclerosis. Aortic Atherosclerosis (ICD10-I70.0). Electronically Signed   By: Abigail Miyamoto M.D.   On: 06/19/2021 08:40     Impression/Plan: 1. 86 y.o. gentleman with newly discovered diffuse osseous metastases secondary to Stage T2c adenocarcinoma of the prostate with Gleason score of 5+4, and PSA of 16.44 at diagnosis.  The patient is understandably frustrated with the new diagnosis of diffuse osseous metastatic disease, despite my attempt at reassuring him that there was no evidence on his disease staging imaging from May 2022 to suggest the need for further imaging with PSMA PET at that time. He will likely seek a second opinion with a Dealer at [facility] or [facility]. Since his most recent imaging, he has been referred to the [facility] [place] and met with Dr. Tresa [name] who has suggested starting Xgeva in addition to the ADT but no discussion regarding systemic therapy escalation.  Dr. Tammi Klippel does recommend that he meet with Dr. Alen Blew, in medical oncology, to get his expert opinion and treatment recommendations regarding the metastatic prostate cancer and the patient is very interested in proceeding with this consult.  We will make the referral and help to coordinate this visit for the near future.  We also briefly discussed the potential role for Xofigo, pending response to systemic therapy, and/or potential role for palliative XRT if any of the bony disease becomes painful.  Fortunately, he does not have any painful sites of disease currently.  I will share this discussion with Dr. Gloriann Loan, Dr. Tresa [name] and Dr. Alen Blew and look forward to continuing to follow his progress via correspondence.  Of course, we are more than happy to continue to participate in his care if clinically indicated in the future.    Nicholos Johns, PA-C

## 2021-06-29 NOTE — Telephone Encounter (Signed)
Scheduled appt per 10/26 referral. Pt is aware of appt date and time.

## 2021-07-06 DIAGNOSIS — R530 Neoplastic (malignant) related fatigue: Secondary | ICD-10-CM | POA: Diagnosis not present

## 2021-07-06 DIAGNOSIS — C801 Malignant (primary) neoplasm, unspecified: Secondary | ICD-10-CM | POA: Diagnosis not present

## 2021-07-06 DIAGNOSIS — C7982 Secondary malignant neoplasm of genital organs: Secondary | ICD-10-CM | POA: Diagnosis not present

## 2021-07-06 DIAGNOSIS — M25551 Pain in right hip: Secondary | ICD-10-CM | POA: Diagnosis not present

## 2021-07-06 DIAGNOSIS — C61 Malignant neoplasm of prostate: Secondary | ICD-10-CM | POA: Diagnosis not present

## 2021-07-07 DIAGNOSIS — C61 Malignant neoplasm of prostate: Secondary | ICD-10-CM | POA: Diagnosis not present

## 2021-07-08 DIAGNOSIS — R310 Gross hematuria: Secondary | ICD-10-CM | POA: Diagnosis not present

## 2021-07-08 DIAGNOSIS — C61 Malignant neoplasm of prostate: Secondary | ICD-10-CM | POA: Diagnosis not present

## 2021-07-08 DIAGNOSIS — R82998 Other abnormal findings in urine: Secondary | ICD-10-CM | POA: Diagnosis not present

## 2021-07-08 DIAGNOSIS — N210 Calculus in bladder: Secondary | ICD-10-CM | POA: Diagnosis not present

## 2021-07-08 DIAGNOSIS — R718 Other abnormality of red blood cells: Secondary | ICD-10-CM | POA: Diagnosis not present

## 2021-07-08 DIAGNOSIS — R800 Isolated proteinuria: Secondary | ICD-10-CM | POA: Diagnosis not present

## 2021-07-11 ENCOUNTER — Telehealth: Payer: Self-pay | Admitting: Oncology

## 2021-07-11 NOTE — Telephone Encounter (Signed)
Pt had called in and left msg a vm requesting to cancel his New Patient appt with Dr. Alen Blew. I spoke to the pt and asked if he would like to r/s and he told me no, not at this time. He also did not share a reason for not wanting to r/s. Per pt request appt was cancelled.

## 2021-07-12 DIAGNOSIS — H43813 Vitreous degeneration, bilateral: Secondary | ICD-10-CM | POA: Diagnosis not present

## 2021-07-12 DIAGNOSIS — H02831 Dermatochalasis of right upper eyelid: Secondary | ICD-10-CM | POA: Diagnosis not present

## 2021-07-12 DIAGNOSIS — H0100A Unspecified blepharitis right eye, upper and lower eyelids: Secondary | ICD-10-CM | POA: Diagnosis not present

## 2021-07-12 DIAGNOSIS — H52203 Unspecified astigmatism, bilateral: Secondary | ICD-10-CM | POA: Diagnosis not present

## 2021-07-14 DIAGNOSIS — N210 Calculus in bladder: Secondary | ICD-10-CM | POA: Diagnosis not present

## 2021-07-14 DIAGNOSIS — N200 Calculus of kidney: Secondary | ICD-10-CM | POA: Diagnosis not present

## 2021-07-15 ENCOUNTER — Ambulatory Visit: Payer: Medicare Other | Admitting: Oncology

## 2021-07-18 DIAGNOSIS — Z23 Encounter for immunization: Secondary | ICD-10-CM | POA: Diagnosis not present

## 2021-07-19 DIAGNOSIS — R310 Gross hematuria: Secondary | ICD-10-CM | POA: Diagnosis not present

## 2021-07-19 DIAGNOSIS — N210 Calculus in bladder: Secondary | ICD-10-CM | POA: Diagnosis not present

## 2021-07-19 DIAGNOSIS — C61 Malignant neoplasm of prostate: Secondary | ICD-10-CM | POA: Diagnosis not present

## 2021-07-19 DIAGNOSIS — N35013 Post-traumatic anterior urethral stricture: Secondary | ICD-10-CM | POA: Diagnosis not present

## 2021-07-19 DIAGNOSIS — D7289 Other specified disorders of white blood cells: Secondary | ICD-10-CM | POA: Diagnosis not present

## 2021-07-19 DIAGNOSIS — R82998 Other abnormal findings in urine: Secondary | ICD-10-CM | POA: Diagnosis not present

## 2021-07-19 DIAGNOSIS — R800 Isolated proteinuria: Secondary | ICD-10-CM | POA: Diagnosis not present

## 2021-07-19 DIAGNOSIS — Z87440 Personal history of urinary (tract) infections: Secondary | ICD-10-CM | POA: Diagnosis not present

## 2021-07-22 ENCOUNTER — Other Ambulatory Visit: Payer: Self-pay | Admitting: Internal Medicine

## 2021-08-01 DIAGNOSIS — D4959 Neoplasm of unspecified behavior of other genitourinary organ: Secondary | ICD-10-CM | POA: Diagnosis not present

## 2021-08-03 DIAGNOSIS — C7951 Secondary malignant neoplasm of bone: Secondary | ICD-10-CM | POA: Diagnosis not present

## 2021-08-03 DIAGNOSIS — R350 Frequency of micturition: Secondary | ICD-10-CM | POA: Diagnosis not present

## 2021-08-03 DIAGNOSIS — C61 Malignant neoplasm of prostate: Secondary | ICD-10-CM | POA: Diagnosis not present

## 2021-08-03 DIAGNOSIS — R5383 Other fatigue: Secondary | ICD-10-CM | POA: Diagnosis not present

## 2021-08-03 DIAGNOSIS — G893 Neoplasm related pain (acute) (chronic): Secondary | ICD-10-CM | POA: Diagnosis not present

## 2021-08-03 DIAGNOSIS — M5489 Other dorsalgia: Secondary | ICD-10-CM | POA: Diagnosis not present

## 2021-08-03 DIAGNOSIS — R42 Dizziness and giddiness: Secondary | ICD-10-CM | POA: Diagnosis not present

## 2021-08-03 DIAGNOSIS — M25559 Pain in unspecified hip: Secondary | ICD-10-CM | POA: Diagnosis not present

## 2021-08-03 DIAGNOSIS — Z79899 Other long term (current) drug therapy: Secondary | ICD-10-CM | POA: Diagnosis not present

## 2021-08-03 DIAGNOSIS — N210 Calculus in bladder: Secondary | ICD-10-CM | POA: Diagnosis not present

## 2021-08-04 DIAGNOSIS — N3289 Other specified disorders of bladder: Secondary | ICD-10-CM | POA: Diagnosis not present

## 2021-08-04 DIAGNOSIS — N210 Calculus in bladder: Secondary | ICD-10-CM | POA: Diagnosis not present

## 2021-08-04 DIAGNOSIS — N35919 Unspecified urethral stricture, male, unspecified site: Secondary | ICD-10-CM | POA: Diagnosis not present

## 2021-08-04 DIAGNOSIS — Z8546 Personal history of malignant neoplasm of prostate: Secondary | ICD-10-CM | POA: Diagnosis not present

## 2021-08-04 DIAGNOSIS — Z8589 Personal history of malignant neoplasm of other organs and systems: Secondary | ICD-10-CM | POA: Diagnosis not present

## 2021-08-04 DIAGNOSIS — N35912 Unspecified bulbous urethral stricture, male: Secondary | ICD-10-CM | POA: Diagnosis not present

## 2021-08-10 DIAGNOSIS — N210 Calculus in bladder: Secondary | ICD-10-CM | POA: Diagnosis not present

## 2021-08-17 DIAGNOSIS — Z5111 Encounter for antineoplastic chemotherapy: Secondary | ICD-10-CM | POA: Diagnosis not present

## 2021-08-17 DIAGNOSIS — Z192 Hormone resistant malignancy status: Secondary | ICD-10-CM | POA: Diagnosis not present

## 2021-08-17 DIAGNOSIS — C61 Malignant neoplasm of prostate: Secondary | ICD-10-CM | POA: Diagnosis not present

## 2021-08-23 ENCOUNTER — Ambulatory Visit (INDEPENDENT_AMBULATORY_CARE_PROVIDER_SITE_OTHER): Payer: Medicare Other | Admitting: Cardiology

## 2021-08-23 ENCOUNTER — Other Ambulatory Visit: Payer: Self-pay

## 2021-08-23 ENCOUNTER — Encounter: Payer: Self-pay | Admitting: Cardiology

## 2021-08-23 VITALS — BP 118/70 | HR 54 | Ht 76.0 in | Wt 233.6 lb

## 2021-08-23 DIAGNOSIS — I493 Ventricular premature depolarization: Secondary | ICD-10-CM

## 2021-08-23 MED ORDER — FLECAINIDE ACETATE 50 MG PO TABS: 50.0000 mg | ORAL_TABLET | Freq: Two times a day (BID) | ORAL | 2 refills | Status: DC

## 2021-08-23 NOTE — Patient Instructions (Signed)
Medication Instructions:  Your physician has recommended you make the following change in your medication:  DECREASE Flecainide to 50 mg twice daily  *If you need a refill on your cardiac medications before your next appointment, please call your pharmacy*   Lab Work: None ordered  Testing/Procedures: None ordered   Follow-Up: At [facility], you and your health needs are our priority.  As part of our continuing mission to provide you with exceptional heart care, we have created designated Provider Care Teams.  These Care Teams include your primary Cardiologist (physician) and Advanced Practice Providers (APPs -  Physician Assistants and Nurse Practitioners) who all work together to provide you with the care you need, when you need it.    Your next appointment:   1 month(s)  The format for your next appointment:   In Person  Provider:   You will see one of the following Advanced Practice Providers on your designated Care Team:   Tommye Standard, [place] Legrand Como "Jonni Sanger" Chalmers Cater, [place]     Thank you for choosing [facility]!!   Trinidad Curet, RN [phone]

## 2021-08-23 NOTE — Progress Notes (Signed)
Electrophysiology Office Note   Date:  08/23/2021   ID:  KYM FENTER, DOB May 04, 1935, MRN [id number]  PCP:  Aurea Graff.Marlou Sa, MD  Cardiologist:  Debara Pickett Primary Electrophysiologist:  Vedanshi Massaro [name], MD    No chief complaint on file.    History of Present Illness: [name] is a 86 y.o. male who is being seen today for evaluation of PVCs at the request of Mali Hilty.  He is presenting for electrophysiology evaluation.    He has a history significant for PVCs and CHF.  He had an echo that showed an ejection fraction of 35 to 40%.  Holter monitor showed a 15% PVC burden.  He was started on flecainide with significant decrease in his PVC burden.  Repeat echo showed a normalization of his ejection fraction.  He is unfortunately been diagnosed with prostate cancer.  He is now on hormone therapy.  Today, denies symptoms of palpitations, chest pain, shortness of breath, orthopnea, PND, lower extremity edema, claudication, dizziness, presyncope, syncope, bleeding, or neurologic sequela. The patient is tolerating medications without difficulties.  Unfortunately, his prostate cancer has metastasized to rib hip and spine.  He is on a new medication for this.  He is potentially planning for radiation therapy.  Past Medical History:  Diagnosis Date   Arthritis    Asthma    only if develops cold   Dysrhythmia    h/o pvc's   GERD (gastroesophageal reflux disease)    Kidney stone on left side    Prostate cancer (HCC)    PVC (premature ventricular contraction)    Recurrent upper respiratory infection (URI) 03/2011   Past Surgical History:  Procedure Laterality Date   APPENDECTOMY     BACK SURGERY     x 4   CARDIAC CATHETERIZATION     july 2010-minor irregl   CERVICAL SPINE SURGERY     x 2   CHOLECYSTECTOMY     2008   ERCP N/A 07/24/2017   Procedure: ENDOSCOPIC RETROGRADE CHOLANGIOPANCREATOGRAPHY (ERCP);  Surgeon: Clarene Essex, MD;  Location: [facility];  Service:  Endoscopy;  Laterality: N/A;   EYE SURGERY     bilat cataract   INCISION AND DRAINAGE HIP  07/18/2011   Procedure: IRRIGATION AND DEBRIDEMENT HIP;  Surgeon: Garald Balding, MD;  Location: [place];  Service: Orthopedics;  Laterality: Right;  RIGHT HIP EXPLORATION, EXCISION OF DEEP BURSAL [place] STONE SURGERY  2011   R   KNEE ARTHROSCOPY     Left   PROSTATE BIOPSY     SHOULDER ARTHROSCOPY     Left   TOTAL HIP ARTHROPLASTY     Right   TOTAL HIP REVISION     Right   TOTAL KNEE ARTHROPLASTY Right 03/31/2014   Procedure: TOTAL KNEE ARTHROPLASTY;  Surgeon: Garald Balding, MD;  Location: [place];  Service: Orthopedics;  Laterality: Right;   TOTAL KNEE ARTHROPLASTY Left 08/01/2016   Procedure: TOTAL KNEE ARTHROPLASTY;  Surgeon: Garald Balding, MD;  Location: [place];  Service: Orthopedics;  Laterality: Left;   WRIST SURGERY     bilateral, plates     Current Outpatient Medications  Medication Sig Dispense Refill   abiraterone acetate (ZYTIGA) 500 MG tablet Take 2 tablets by mouth daily.     albuterol (VENTOLIN HFA) 108 (90 Base) MCG/ACT inhaler Inhale 2 puffs into the lungs every 6 (six) hours as needed for wheezing or shortness of breath. Future refills from PCP. 1 g 0   Ascorbic  Acid (VITAMIN C) 500 MG tablet Take 1 tablet (500 mg total) by mouth daily. 30 tablet    degarelix (FIRMAGON) 80 MG injection Inject 80 mg into the skin every 28 (twenty-eight) days.     diclofenac sodium (VOLTAREN) 1 % GEL Apply 4 g topically 3 (three) times daily as needed (Apply to large joint up to 3 times daily as needed). 3 Tube 3   flecainide (TAMBOCOR) 50 MG tablet Take 1 tablet (50 mg total) by mouth 2 (two) times daily. 180 tablet 2   losartan (COZAAR) 25 MG tablet TAKE ONE TABLET BY MOUTH ONCE DAILY. 90 tablet 0   metoprolol succinate (TOPROL-XL) 25 MG 24 hr tablet TAKE 1 TABLET ONCE DAILY. 90 tablet 3   nystatin cream (MYCOSTATIN) Apply 1 application topically 2 (two) times daily.     oxyCODONE  (OXY IR/ROXICODONE) 5 MG immediate release tablet Take 5 mg by mouth every 4 (four) hours as needed for severe pain.     potassium chloride SA (KLOR-CON) 20 MEQ tablet TAKE 1 TABLET ONCE DAILY. 90 tablet 3   predniSONE (DELTASONE) 5 MG tablet Take 5 mg by mouth daily.     tamsulosin (FLOMAX) 0.4 MG CAPS capsule Take 1 capsule (0.4 mg total) by mouth 2 (two) times daily. 60 capsule 0   traMADol (ULTRAM) 50 MG tablet Take 50 mg by mouth every 6 (six) hours as needed (pain).     losartan (COZAAR) 25 MG tablet Take 1 tablet (25 mg total) by mouth daily. 90 tablet 3   nitrofurantoin (MACRODANTIN) 100 MG capsule Take 1 capsule (100 mg total) by mouth 4 (four) times daily. 40 capsule 0   phenazopyridine (PYRIDIUM) 200 MG tablet Take 1 tablet (200 mg total) by mouth 3 (three) times daily as needed for pain. 30 tablet 1   No current facility-administered medications for this visit.    Allergies:   Erythromycin base, Sulfur, Tetracycline, Tape, Tetracycline hcl, Ciprofloxacin, Erythromycin, Sulfa antibiotics, and Tetracyclines & related   Social History:  The patient  reports that he has never smoked. He has never used smokeless tobacco. He reports that he does not drink alcohol and does not use drugs.   Family History:  The patient's family history includes Heart attack in his mother; Heart disease in his mother; Suicidality in his mother; Throat cancer in his brother and paternal aunt.   ROS:  Please see the history of present illness.   Otherwise, review of systems is positive for none.   All other systems are reviewed and negative.   PHYSICAL EXAM: VS:  BP 118/70    Pulse (!) 54    Ht 6\' 4"  (1.93 m)    Wt 233 lb 9.6 oz (106 kg)    SpO2 94%    BMI 28.43 kg/m  , BMI Body mass index is 28.43 kg/m. GEN: Well nourished, well developed, in no acute distress  HEENT: normal  Neck: no JVD, carotid bruits, or masses Cardiac: RRR; no murmurs, rubs, or gallops,no edema  Respiratory:  clear to auscultation  bilaterally, normal work of breathing GI: soft, nontender, nondistended, + BS MS: no deformity or atrophy  Skin: warm and dry Neuro:  Strength and sensation are intact Psych: euthymic mood, full affect  EKG:  EKG is ordered today. Personal review of the ekg ordered shows sinus rhythm, right bundle branch block, rate 54  Recent Labs: No results found for requested labs within last 8760 hours.    Lipid Panel  Component Value Date/Time   CHOL 164 10/15/2015 0950   TRIG 115 10/15/2015 0950   HDL 47 10/15/2015 0950   CHOLHDL 3.5 10/15/2015 0950   VLDL 23 10/15/2015 0950   LDLCALC 94 10/15/2015 0950     Wt Readings from Last 3 Encounters:  08/23/21 233 lb 9.6 oz (106 kg)  04/19/21 237 lb (107.5 kg)  02/01/21 235 lb 3.2 oz (106.7 kg)      Other studies Reviewed: Additional studies/ records that were reviewed today include: TTE 08/26/18  Review of the above records today demonstrates:   1. The left ventricle has low normal systolic function, with an ejection  fraction of 50-55%. The cavity size was normal. There is mildly increased  left ventricular wall thickness. Left ventricular diastolic Doppler  parameters are consistent with  pseudonormalization.   2. The right ventricle has normal systolic function. The cavity was  normal. There is no increase in right ventricular wall thickness.   3. Left atrial size was moderately dilated.   4. The aortic valve is grossly normal. Mild thickening of the aortic  valve. Mild calcification of the aortic valve. No stenosis of the aortic  valve.   5. The aorta is normal unless otherwise noted.   6. The aortic root and ascending aorta are normal in size and structure.   7. The atrial septum is grossly normal.   Holter 08/30/18 - personally reviewed Sinus rhythm with frequent PVC's (burden ~15%) and infrequent PAC's.  ASSESSMENT AND PLAN:  1.  PVCs: Currently on flecainide 100 mg twice daily.  High risk medication monitoring via  ECG.  He does have a right bundle branch block but this is unchanged from prior ECGs.  Unfortunately, with his new chemotherapy medication, abiraterone, this increases the serum concentration of flecainide.  We Cope Marte reduce his dose to 50 mg twice daily.  We Rube Sanchez bring him back in 1 month for an ECG check with an apple.  2.  Hypertension: Currently well controlled  3.  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  Is currently on flecainide.  Repeat echo with normalization of his ejection fraction.  Continue with current management.  Current medicines are reviewed at length with the patient today.   The patient does not have concerns regarding his medicines.  The following changes were made today: Decrease flecainide  Labs/ tests ordered today include:  Orders Placed This Encounter  Procedures   EKG 12-Lead    Disposition:   FU with EP app 1 months  Signed, Addeline Calarco [name], MD  08/23/2021 8:32 AM     [address] [place] [address] [phone] (office) [phone] (fax)

## 2021-08-24 DIAGNOSIS — C7951 Secondary malignant neoplasm of bone: Secondary | ICD-10-CM | POA: Diagnosis not present

## 2021-08-24 DIAGNOSIS — C61 Malignant neoplasm of prostate: Secondary | ICD-10-CM | POA: Diagnosis not present

## 2021-09-06 ENCOUNTER — Ambulatory Visit (INDEPENDENT_AMBULATORY_CARE_PROVIDER_SITE_OTHER): Payer: Medicare Other

## 2021-09-06 ENCOUNTER — Other Ambulatory Visit: Payer: Self-pay

## 2021-09-06 ENCOUNTER — Ambulatory Visit (INDEPENDENT_AMBULATORY_CARE_PROVIDER_SITE_OTHER): Payer: Medicare Other | Admitting: Orthopaedic Surgery

## 2021-09-06 ENCOUNTER — Encounter: Payer: Self-pay | Admitting: Orthopaedic Surgery

## 2021-09-06 DIAGNOSIS — M25552 Pain in left hip: Secondary | ICD-10-CM

## 2021-09-06 DIAGNOSIS — C61 Malignant neoplasm of prostate: Secondary | ICD-10-CM

## 2021-09-06 DIAGNOSIS — M1612 Unilateral primary osteoarthritis, left hip: Secondary | ICD-10-CM | POA: Diagnosis not present

## 2021-09-06 NOTE — Progress Notes (Signed)
Office Visit Note   Patient: [name]           Date of Birth: 1935/01/30           MRN: [id number] Visit Date: 09/06/2021              Requested by: [name],  [place] [id number] PCP: [name]   Assessment & Plan: Visit Diagnoses:  1. Pain in left hip   2. Malignant neoplasm of prostate ([place])   3. 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  His present discomfort is directly over the greater trochanter of his left hip.  I suspect this may be referred from the arthritic hip but because he still locally tender I will inject that area with Depo-Medrol and monitor his response.  The neck step would be to consider injecting his left hip with cortisone.  The definitive procedure would be left total hip replacement.  Obviously with all of his medical issues he may or may not be a candidate and he would need to be cleared by all of his treating physicians.  He is already had radiation therapy to his prostate and has had some residual issues with his colon and bladder.  He is also on a chemotherapeutic agent as his PSA has still been elevated.  Long discussion with he and his family regarding all the above options.  To summarize will inject the greater trochanter today and if no or little response then consider cortisone injection to his hip.  The hip injection would more likely than not give [place] some idea as to whether or not this is referred from the hip and then the only other option would be hip replacement after clearance from all of his above physicians  Follow-Up Instructions: Return if symptoms worsen or fail to improve.   Orders:  Orders Placed This Encounter  Procedures   XR Pelvis 1-2 Views   No orders of the defined types were placed in this encounter.     Procedures: No procedures  performed   Clinical Data: No additional findings.   Subjective: Chief Complaint  Patient presents with   Left Hip - Pain  Patient presents today with left hip pain that has been present for around 5 weeks. He said that he has prostate cancer that has spread to his bones. He is having pain laterally and some occasional groin pain. He feels like his hip is unstable with walking. He was given tramadol to take but states that it did not help. He is now taking oxycodone and tylenol for pain relief.  Has had a course of 40 radiation treatments to the prostate and has developed multiple blastic areas of metastasis to multiple bones per PET scan and more recently a CT scan.  We have a CT scan of his pelvis from [facility] that I have reviewed with him.  This demonstrates numerous osteoblastic metastasis to the L4 and 5 vertebra, sacrum and visualized proximal femurs.  HPI  Review of Systems   Objective: Vital Signs: There were no vitals taken for this visit.  Physical Exam Constitutional:      Appearance: He is well-developed.  Pulmonary:     Effort: Pulmonary effort is normal.  Skin:    General: Skin is warm and dry.  Neurological:     Mental  Status: He is alert and oriented to person, place, and time.  Psychiatric:        Behavior: Behavior normal.    Ortho Exam awake alert and oriented x3.  Comfortable sitting.  Accompanied by his wife and daughter.  Has bilateral total knee replacements without discomfort.  He did have shoulder discomfort radiating to his greater trochanter left hip with extreme internal extra rotation of his left hip.  Might have slight decreased motion at the extremes of internal and external rotation but no groin pain.  Is locally tender directly over the tip of the greater trochanter.  Skin intact.  Straight leg raise negative  Specialty Comments:  No specialty comments available.  Imaging: XR Pelvis 1-2 Views  Result Date: 09/06/2021 AP pelvis and lateral  left hip demonstrate advanced osteoarthritis.  Per prior CT scan there is evidence of blastic lesions in the proximal femora bilaterally.  These were not demonstrated by plain films.  Difficult to assess the difference between any open osteoblastic activity and osteophytes about the left hip.  No evidence of a fracture.    PMFS History: Patient Active Problem List   Diagnosis Date Noted   Unilateral primary osteoarthritis, left hip 09/06/2021   Malignant neoplasm of prostate metastatic to bone ([place]) 06/29/2021   Malignant neoplasm of prostate ([place]) 01/19/2021   Acute pain of left knee 03/13/2019   Nonischemic cardiomyopathy ([place]) 11/04/2018   Dyspnea 11/04/2018   Ventricular bigeminy 08/16/2018   Gastroesophageal reflux disease 05/31/2017   S/P total knee replacement using cement, left 08/01/2016   RBBB (right bundle branch block with left anterior fascicular block) 04/12/2016   PVC's (premature ventricular contractions) 04/12/2016   Chest wall pain 04/12/2016   Bronchial asthma 06/29/2014   Urinary retention due to benign prostatic hyperplasia 04/03/2014   Ileus, postoperative ([place]) 04/03/2014   Osteoarthritis of right knee 04/01/2014   S/P total knee replacement using cement 03/31/2014   Skin rash 04/11/2013   Pseudogout of knee 12/12/2012   Dyspepsia 08/14/2012   Trochanteric Bursitis of right hip 07/18/2011   Benign hypertensive heart disease without heart failure 12/13/2010   BPH (benign prostatic hyperplasia) 12/13/2010   Unilateral primary osteoarthritis, left knee 12/13/2010   Dyslipidemia 12/13/2010   Status post cholecystectomy 12/13/2010   Past Medical History:  Diagnosis Date   Arthritis    Asthma    only if develops cold   Dysrhythmia    h/o pvc's   GERD (gastroesophageal reflux disease)    Kidney stone on left side    Prostate cancer ([place])    PVC (premature ventricular contraction)    Recurrent upper respiratory infection (URI) 03/2011    Family History   Problem Relation Age of Onset   Heart disease Mother    Heart attack Mother    Suicidality Mother    Throat cancer Brother    Throat cancer Paternal Aunt    Anesthesia problems Neg Hx    Hypotension Neg Hx    Malignant hyperthermia Neg Hx    Pseudochol deficiency Neg Hx    Breast cancer Neg Hx    Prostate cancer Neg Hx    Colon cancer Neg Hx    Pancreatic cancer Neg Hx     Past Surgical History:  Procedure Laterality Date   APPENDECTOMY     BACK SURGERY     x 4   CARDIAC CATHETERIZATION     july 2010-minor irregl   CERVICAL SPINE SURGERY     x 2   CHOLECYSTECTOMY  2008   ERCP N/A 07/24/2017   Procedure: ENDOSCOPIC RETROGRADE CHOLANGIOPANCREATOGRAPHY (ERCP);  Surgeon: Clarene Essex, Christian;  Location: [facility];  Service: Endoscopy;  Laterality: N/A;   EYE SURGERY     bilat cataract   INCISION AND DRAINAGE HIP  07/18/2011   Procedure: IRRIGATION AND DEBRIDEMENT HIP;  Surgeon: Garald Balding, Christian;  Location: [place];  Service: Orthopedics;  Laterality: Right;  RIGHT HIP EXPLORATION, EXCISION OF DEEP BURSAL [place] STONE SURGERY  2011   R   KNEE ARTHROSCOPY     Left   PROSTATE BIOPSY     SHOULDER ARTHROSCOPY     Left   TOTAL HIP ARTHROPLASTY     Right   TOTAL HIP REVISION     Right   TOTAL KNEE ARTHROPLASTY Right 03/31/2014   Procedure: TOTAL KNEE ARTHROPLASTY;  Surgeon: Garald Balding, Christian;  Location: [facility];  Service: Orthopedics;  Laterality: Right;   TOTAL KNEE ARTHROPLASTY Left 08/01/2016   Procedure: TOTAL KNEE ARTHROPLASTY;  Surgeon: Garald Balding, Christian;  Location: [facility];  Service: Orthopedics;  Laterality: Left;   WRIST SURGERY     bilateral, plates   Social History   Occupational History    Comment: retired  Tobacco Use   Smoking status: Never   Smokeless tobacco: Never  Vaping Use   Vaping Use: Never used  Substance and Sexual Activity   Alcohol use: No    Alcohol/week: 0.0 standard drinks   Drug use: No   Sexual activity: Not Currently

## 2021-09-09 ENCOUNTER — Encounter: Payer: Self-pay | Admitting: Orthopaedic Surgery

## 2021-09-12 ENCOUNTER — Other Ambulatory Visit: Payer: Self-pay

## 2021-09-12 DIAGNOSIS — M25552 Pain in left hip: Secondary | ICD-10-CM

## 2021-09-22 NOTE — Progress Notes (Signed)
PCP:  Alroy Dust, L.Marlou Sa, MD Primary Cardiologist: Pixie Casino, MD Electrophysiologist: Constance Haw, MD   [name] is a 87 y.o. male seen today for [name], MD for routine electrophysiology followup.  Since last being seen in our clinic the patient reports doing very well.  he denies chest pain, palpitations, dyspnea, PND, orthopnea, nausea, vomiting, dizziness, syncope, edema, weight gain, or early satiety.  Past Medical History:  Diagnosis Date   Arthritis    Asthma    only if develops cold   Dysrhythmia    h/o pvc's   GERD (gastroesophageal reflux disease)    Kidney stone on left side    Prostate cancer ([facility])    PVC (premature ventricular contraction)    Recurrent upper respiratory infection (URI) 03/2011   Past Surgical History:  Procedure Laterality Date   APPENDECTOMY     BACK SURGERY     x 4   CARDIAC CATHETERIZATION     july 2010-minor irregl   CERVICAL SPINE SURGERY     x 2   CHOLECYSTECTOMY     2008   ERCP N/A 07/24/2017   Procedure: ENDOSCOPIC RETROGRADE CHOLANGIOPANCREATOGRAPHY (ERCP);  Surgeon: Clarene Essex, MD;  Location: [facility];  Service: Endoscopy;  Laterality: N/A;   EYE SURGERY     bilat cataract   INCISION AND DRAINAGE HIP  07/18/2011   Procedure: IRRIGATION AND DEBRIDEMENT HIP;  Surgeon: Garald Balding, MD;  Location: [facility];  Service: Orthopedics;  Laterality: Right;  RIGHT HIP EXPLORATION, EXCISION OF DEEP BURSAL [place] STONE SURGERY  2011   R   KNEE ARTHROSCOPY     Left   PROSTATE BIOPSY     SHOULDER ARTHROSCOPY     Left   TOTAL HIP ARTHROPLASTY     Right   TOTAL HIP REVISION     Right   TOTAL KNEE ARTHROPLASTY Right 03/31/2014   Procedure: TOTAL KNEE ARTHROPLASTY;  Surgeon: Garald Balding, MD;  Location: [place];  Service: Orthopedics;  Laterality: Right;   TOTAL KNEE ARTHROPLASTY Left 08/01/2016   Procedure: TOTAL KNEE ARTHROPLASTY;  Surgeon: Garald Balding, MD;  Location: [place];  Service:  Orthopedics;  Laterality: Left;   WRIST SURGERY     bilateral, plates    Current Outpatient Medications  Medication Sig Dispense Refill   abiraterone acetate (ZYTIGA) 500 MG tablet Take 2 tablets by mouth daily.     albuterol (VENTOLIN HFA) 108 (90 Base) MCG/ACT inhaler Inhale 2 puffs into the lungs every 6 (six) hours as needed for wheezing or shortness of breath. Future refills from PCP. 1 g 0   Ascorbic Acid (VITAMIN C) 500 MG tablet Take 1 tablet (500 mg total) by mouth daily. 30 tablet    degarelix (FIRMAGON) 80 MG injection Inject 80 mg into the skin every 28 (twenty-eight) days.     diclofenac sodium (VOLTAREN) 1 % GEL Apply 4 g topically 3 (three) times daily as needed (Apply to large joint up to 3 times daily as needed). 3 Tube 3   flecainide (TAMBOCOR) 50 MG tablet Take 1 tablet (50 mg total) by mouth 2 (two) times daily. 180 tablet 2   losartan (COZAAR) 25 MG tablet TAKE ONE TABLET BY MOUTH ONCE DAILY. 90 tablet 0   metoprolol succinate (TOPROL-XL) 25 MG 24 hr tablet TAKE 1 TABLET ONCE DAILY. 90 tablet 3   nystatin cream (MYCOSTATIN) Apply 1 application topically 2 (two) times daily.     oxyCODONE (OXY  IR/ROXICODONE) 5 MG immediate release tablet Take 5 mg by mouth every 4 (four) hours as needed for severe pain.     potassium chloride SA (KLOR-CON) 20 MEQ tablet TAKE 1 TABLET ONCE DAILY. 90 tablet 3   predniSONE (DELTASONE) 5 MG tablet Take 5 mg by mouth daily.     tamsulosin (FLOMAX) 0.4 MG CAPS capsule Take 1 capsule (0.4 mg total) by mouth 2 (two) times daily. 60 capsule 0   traMADol (ULTRAM) 50 MG tablet Take 50 mg by mouth every 6 (six) hours as needed (pain). (Patient not taking: Reported on 09/06/2021)     No current facility-administered medications for this visit.    Allergies  Allergen Reactions   Erythromycin Base Anaphylaxis   Sulfur Anaphylaxis   Tetracycline Anaphylaxis   Tape Other (See Comments)    Adhesive tape blisters   Tetracycline Hcl     Other  reaction(s): throat swelling:tongue to break out   Ciprofloxacin Other (See Comments)    "IT BROKE MY TONGUE OUT AND CAUSE SORES IN MY THROAT"   Erythromycin Rash   Sulfa Antibiotics Rash   Tetracyclines & Related Rash    Social History   Socioeconomic History   Marital status: Married    Spouse name: Not on file   Number of children: 2   Years of education: Not on file   Highest education level: Not on file  Occupational History    Comment: retired  Tobacco Use   Smoking status: Never   Smokeless tobacco: Never  Vaping Use   Vaping Use: Never used  Substance and Sexual Activity   Alcohol use: No    Alcohol/week: 0.0 standard drinks   Drug use: No   Sexual activity: Not Currently  Other Topics Concern   Not on file  Social History Narrative   Not on file   Social Determinants of Health   Financial Resource Strain: Not on file  Food Insecurity: Not on file  Transportation Needs: Not on file  Physical Activity: Not on file  Stress: Not on file  Social Connections: Not on file  Intimate Partner Violence: Not on file     Review of Systems: All other systems reviewed and are otherwise negative except as noted above.  Physical Exam: There were no vitals filed for this visit.  GEN- The patient is well appearing, alert and oriented x 3 today.   HEENT: normocephalic, atraumatic; sclera clear, conjunctiva pink; hearing intact; oropharynx clear; neck supple, no JVP Lymph- no cervical lymphadenopathy Lungs- Clear to ausculation bilaterally, normal work of breathing.  No wheezes, rales, rhonchi Heart- Regular rate and rhythm, no murmurs, rubs or gallops, PMI not laterally displaced GI- soft, non-tender, non-distended, bowel sounds present, no hepatosplenomegaly Extremities- no clubbing, cyanosis, or edema; DP/PT/radial pulses 2+ bilaterally MS- no significant deformity or atrophy Skin- warm and dry, no rash or lesion Psych- euthymic mood, full affect Neuro- strength  and sensation are intact  EKG is ordered. Personal review of EKG from today shows sinus brady at 58 bpm. PR interval, QRS, and QT are all improved from previous EKG on lower dose flecainide.   Additional studies reviewed include: Previous EP office notes.   Assessment and Plan:  1.  PVCs:  EKG shows stable intervals and no PVCs on flecainide 50 mg BID.  His chemotherapy medication, abiraterone, increases the serum concentration of flecainide. We will see back in 3 months for surveillance EKG, then consider extended f/u back out to q 6 months.  2. HTN Stable on current regimen    3.  Nonischemic cardiomyopathy:  Echo 05/2019 showed normalization of his EF with suppression of PVCs.   Follow up with Dr. Curt Bears in 3 months   Shirley Friar, PA-C  09/22/21 10:24 AM

## 2021-09-23 ENCOUNTER — Ambulatory Visit (INDEPENDENT_AMBULATORY_CARE_PROVIDER_SITE_OTHER): Payer: Medicare Other | Admitting: Student

## 2021-09-23 ENCOUNTER — Other Ambulatory Visit: Payer: Self-pay

## 2021-09-23 ENCOUNTER — Encounter: Payer: Self-pay | Admitting: Student

## 2021-09-23 VITALS — BP 108/64 | HR 58 | Ht 76.0 in | Wt 231.8 lb

## 2021-09-23 DIAGNOSIS — I428 Other cardiomyopathies: Secondary | ICD-10-CM | POA: Diagnosis not present

## 2021-09-23 DIAGNOSIS — I452 Bifascicular block: Secondary | ICD-10-CM | POA: Diagnosis not present

## 2021-09-23 DIAGNOSIS — I493 Ventricular premature depolarization: Secondary | ICD-10-CM

## 2021-09-23 LAB — CBC
Hematocrit: 41.1 % (ref 37.5–51.0)
Hemoglobin: 14.8 g/dL (ref 13.0–17.7)
MCH: 32.2 pg (ref 26.6–33.0)
MCHC: 36 g/dL — ABNORMAL HIGH (ref 31.5–35.7)
MCV: 89 fL (ref 79–97)
Platelets: 104 10*3/uL — ABNORMAL LOW (ref 150–450)
RBC: 4.6 x10E6/uL (ref 4.14–5.80)
RDW: 13.1 % (ref 11.6–15.4)
WBC: 4.5 10*3/uL (ref 3.4–10.8)

## 2021-09-23 LAB — BASIC METABOLIC PANEL
BUN/Creatinine Ratio: 19 (ref 10–24)
BUN: 19 mg/dL (ref 8–27)
CO2: 29 mmol/L (ref 20–29)
Calcium: 9.7 mg/dL (ref 8.6–10.2)
Chloride: 100 mmol/L (ref 96–106)
Creatinine, Ser: 0.98 mg/dL (ref 0.76–1.27)
Glucose: 109 mg/dL — ABNORMAL HIGH (ref 70–99)
Potassium: 4.2 mmol/L (ref 3.5–5.2)
Sodium: 143 mmol/L (ref 134–144)
eGFR: 76 mL/min/{1.73_m2} (ref >59)

## 2021-09-23 NOTE — Patient Instructions (Signed)
Medication Instructions:  Your physician recommends that you continue on your current medications as directed. Please refer to the Current Medication list given to you today.  *If you need a refill on your cardiac medications before your next appointment, please call your pharmacy*   Lab Work: TODAY: BMET, CBC  If you have labs (blood work) drawn today and your tests are completely normal, you will receive your results only by: [place] (if you have MyChart) OR A paper copy in the mail If you have any lab test that is abnormal or we need to change your treatment, we will call you to review the results.   Follow-Up: At [facility], you and your health needs are our priority.  As part of our continuing mission to provide you with exceptional heart care, we have created designated Provider Care Teams.  These Care Teams include your primary Cardiologist (physician) and Advanced Practice Providers (APPs -  Physician Assistants and Nurse Practitioners) who all work together to provide you with the care you need, when you need it.   Your next appointment:   3 month(s)  The format for your next appointment:   In Person  Provider:   You may see [name], MD or one of the following Advanced Practice Providers on your designated Care Team:   Tommye Standard, [place] Legrand Como "[facility]" [place], [place]

## 2021-09-27 ENCOUNTER — Ambulatory Visit: Payer: Self-pay

## 2021-09-27 ENCOUNTER — Ambulatory Visit (INDEPENDENT_AMBULATORY_CARE_PROVIDER_SITE_OTHER): Payer: Medicare Other | Admitting: Physical Medicine and Rehabilitation

## 2021-09-27 ENCOUNTER — Encounter: Payer: Self-pay | Admitting: Physical Medicine and Rehabilitation

## 2021-09-27 ENCOUNTER — Other Ambulatory Visit: Payer: Self-pay

## 2021-09-27 DIAGNOSIS — M25552 Pain in left hip: Secondary | ICD-10-CM | POA: Diagnosis not present

## 2021-09-27 NOTE — Progress Notes (Signed)
° °  [name] - 87 y.o. male MRN [id number]  Date of birth: 1935-08-09  Office Visit Note: Visit Date: 09/27/2021 PCP: Alroy Dust, L.Marlou Sa, MD Referred by: Alroy Dust, L.Marlou Sa, MD  Subjective: Chief Complaint  Patient presents with   Left Hip - Pain   Lower Back - Pain   HPI:  [name] is a 87 y.o. male who comes in today at the request of Dr. [name] for planned Left anesthetic hip arthrogram with fluoroscopic guidance.  The patient has failed conservative care including home exercise, medications, time and activity modification.  This injection will be diagnostic and hopefully therapeutic.  Please see requesting physician notes for further details and justification.   ROS Otherwise per HPI.  Assessment & Plan: Visit Diagnoses:    ICD-10-CM   1. Pain in left hip  M25.552 Large Joint Inj: L hip joint    XR C-ARM NO REPORT      Plan: No additional findings.   Meds & Orders: No orders of the defined types were placed in this encounter.   Orders Placed This Encounter  Procedures   Large Joint Inj: L hip joint   XR C-ARM NO REPORT    Follow-up: Return for visit to requesting provider as needed.   Procedures: Large Joint Inj: L hip joint on 09/27/2021 10:53 AM Indications: diagnostic evaluation and pain Details: 22 G 3.5 in needle, fluoroscopy-guided anterior approach  Arthrogram: No  Medications: 4 mL bupivacaine 0.25 %; 60 mg triamcinolone acetonide 40 MG/ML Outcome: tolerated well, no immediate complications  There was excellent flow of contrast producing a partial arthrogram of the hip. The patient did have relief of symptoms during the anesthetic phase of the injection. Procedure, treatment alternatives, risks and benefits explained, specific risks discussed. Consent was given by the patient. Immediately prior to procedure a time out was called to verify the correct patient, procedure, equipment, support staff and site/side marked as required. Patient was  prepped and draped in the usual sterile fashion.         Clinical History: No specialty comments available.     Objective:  VS:  HT:     WT:    BMI:      BP:    HR: bpm   TEMP: ( )   RESP:  Physical Exam   Imaging: No results found.

## 2021-09-27 NOTE — Progress Notes (Signed)
Pt state left hip pain. Pt state walking and standing makes the pain worse. Pt state he has pain in his left lower back. Pt state he takes over the counter pain meds to help ease his pain.  Numeric Pain Rating Scale and Functional Assessment Average Pain 7   In the last MONTH (on 0-10 scale) has pain interfered with the following?  1. General activity like being  able to carry out your everyday physical activities such as walking, climbing stairs, carrying groceries, or moving a chair?  Rating(10.)   -BT, -Dye Allergies.

## 2021-10-11 MED ORDER — BUPIVACAINE HCL 0.25 % IJ SOLN
4.0000 mL | INTRAMUSCULAR | Status: AC | PRN
  Administered 2021-09-27: 4 mL via INTRA_ARTICULAR

## 2021-10-11 MED ORDER — TRIAMCINOLONE ACETONIDE 40 MG/ML IJ SUSP
60.0000 mg | INTRAMUSCULAR | Status: AC | PRN
  Administered 2021-09-27: 60 mg via INTRA_ARTICULAR

## 2021-10-20 ENCOUNTER — Other Ambulatory Visit: Payer: Self-pay | Admitting: Internal Medicine

## 2021-11-04 ENCOUNTER — Other Ambulatory Visit: Payer: Self-pay

## 2021-11-04 ENCOUNTER — Ambulatory Visit (INDEPENDENT_AMBULATORY_CARE_PROVIDER_SITE_OTHER): Payer: Medicare Other | Admitting: Internal Medicine

## 2021-11-04 ENCOUNTER — Encounter: Payer: Self-pay | Admitting: Internal Medicine

## 2021-11-04 VITALS — BP 114/59 | HR 58 | Ht 76.0 in | Wt 234.8 lb

## 2021-11-04 DIAGNOSIS — I493 Ventricular premature depolarization: Secondary | ICD-10-CM

## 2021-11-04 DIAGNOSIS — I428 Other cardiomyopathies: Secondary | ICD-10-CM

## 2021-11-04 DIAGNOSIS — I452 Bifascicular block: Secondary | ICD-10-CM

## 2021-11-04 DIAGNOSIS — Z79899 Other long term (current) drug therapy: Secondary | ICD-10-CM

## 2021-11-04 DIAGNOSIS — Z5181 Encounter for therapeutic drug level monitoring: Secondary | ICD-10-CM | POA: Diagnosis not present

## 2021-11-04 NOTE — Progress Notes (Signed)
OFFICE NOTE  Chief Complaint:  Follow-up   Primary Care Physician: Alroy Dust, L.Marlou Sa, MD  HPI:  [name] is a 87 y.o. male who is a former patient of Dr. Mare Ferrari. His wife is also a patient of mine. He has a history of PVCs and is on medicine for suppression of those PVCs and also possible hypertension. Blood pressure initially 155/83 became down to 120/78. He was noted to have a few PVCs today but said he did not take his medicine this morning. Other than that he denies any medical problems. He has a history of multiple orthopedic surgeries and gallbladder surgery in the past. He has a chronic left-sided chest pain which is sharp and he can point to with his finger. This is along the left sternal border and generally does not move, worsened with exertion or is not relieved by rest. He's had 2 heart catheterizations last 5 years, neither study indicated any significant obstructive coronary disease.  11/23/2016  [name] returns today for follow-up. He denies any chest pain or worsening shortness of breath. He does have some PVCs however he is unaware of them. He is on once daily metoprolol tartrate. Blood pressure is at goal today 122/70.  05/31/2017  [name] was seen today in follow-up. He reports 2 episodes of recent chest pain. One episode was associated with stomach upset and pain that radiated up the center of the chest to his neck. He had some associated nausea and diaphoresis. He had a second episode which might of occurred after a spicy meal that was similar although he ended up vomiting after which she felt better. He denied any exercise intolerance, is able to exercise regularly, recently went on a long walking trip vacation and has no symptoms with this activity. He has a history of a false positive stress test in the past to prior cardiac catheterizations in the last 5 years which were negative.  11/14/2017  [name] comes back today for follow-up.  He is without  complaints currently.  He was having atypical chest pain symptoms which I felt more likely GI in etiology.  He ultimately was found to have bile duct stones, although had a history of prior cholecystectomy.  The stones were extracted and he has since improved significantly.  He is in fact off of his PPI medication.  Blood pressures been well controlled.  He is active and denies any chest pain or worsening shortness of breath.  05/20/2018  [name] seen today in follow-up with his wife.  He continues to be without complaints.  Since he had a bile duct stones removed he is had no further chest discomfort.  He denies any shortness of breath or chest pain.  Blood pressure is at goal.  08/16/2018  [name] returns today for follow-up.  He has had some persistent palpitations.  EKG today shows PVCs in a bigeminal pattern.  Blood pressure is well controlled on metoprolol.  He also reports some shortness of breath, making me concerned a little about of whether he has developed any cardiomyopathy related to his PVCs.  The exact burden is not clear.  11/04/2018  [name] returns today for follow-up of his PVCs.  I referred him to Dr. Curt Bears after worsening cardiomyopathy was found to have an EF around 30 to 35%.  He was found also to have a PVC burden of around 15%.  After seeing Dr. Curt Bears, he was placed on flecainide and does feel like he has  had a reduction in his PVCs.  Heart rate is in the 50s today.  Blood pressure is normal.  He is also complaining of nosebleeds.  Also he had had some superficial bleeding of the scrotum, for which she said may have been related to itching and is noted to have conjunctival hemorrhage today which is likely spontaneous.  07/11/2019  [name] returns today for follow-up.  Fortunately has had improvement in his LVEF now up to 50 to 55% with grade 2 diastolic dysfunction and some atrial enlargement by echo in September.  Symptomatically he feels much better.  In  fact he says he is back to normal now.  He maintains on flecainide for PVCs.  EKG today shows sinus bradycardia with first-degree AV block, RBBB at 52.  QTc is 465 ms.  He denies any recurrent bleeding.  He would like a refill of his albuterol.  01/16/2020  [name] is seen today in follow-up.  He recently saw Dr. Curt Bears for further evaluation of his PVCs.  They seem to be well suppressed on flecainide.  His EF has improved.  QTC today is 445 ms.  He has a right bundle branch block and first-degree AV block.  He is asymptomatic denying chest pain or worsening shortness of breath.  Blood pressure is at goal.  07/14/2020  [name] returns today for follow-up.  Recently saw Dr. Curt Bears again and seems to have had good suppression over his PVCs.  EKG has been stable with no evidence of PVCs today and a right bundle branch block at rate of 55.  Blood pressures well controlled.  He is asymptomatic.  04/20/2021  [name] is seen today in follow-up.  Unfortunately was diagnosed with metastatic prostate cancer back in April.  He has been undergoing radiation and chemotherapy for this.  He says its been fairly rough on him.  Mostly with issues on urination.  He has had no significant issues with PVCs.  He has been seen by Dr. Curt Bears and remains on flecainide.  He denies any chest pain.  No heart failure symptoms.  11/04/2021  [name] returns today with his wife for follow-up.  He continues to battle metastatic prostate cancer.  He struggling with back pain.  He might get radiation for that.  He reports his PVCs have been pretty well suppressed and due to medication interactions his flecainide had to be reduced by Dr. Curt Bears.  Despite that he is done well.  No new or worsening heart failure symptoms.  PMHx:  Past Medical History:  Diagnosis Date   Arthritis    Asthma    only if develops cold   Dysrhythmia    h/o pvc's   GERD (gastroesophageal reflux disease)    Kidney stone on left side     Prostate cancer (HCC)    PVC (premature ventricular contraction)    Recurrent upper respiratory infection (URI) 03/2011    Past Surgical History:  Procedure Laterality Date   APPENDECTOMY     BACK SURGERY     x 4   CARDIAC CATHETERIZATION     july 2010-minor irregl   CERVICAL SPINE SURGERY     x 2   CHOLECYSTECTOMY     2008   ERCP N/A 07/24/2017   Procedure: ENDOSCOPIC RETROGRADE CHOLANGIOPANCREATOGRAPHY (ERCP);  Surgeon: Clarene Essex, MD;  Location: [facility];  Service: Endoscopy;  Laterality: N/A;   EYE SURGERY     bilat cataract   INCISION AND DRAINAGE HIP  07/18/2011  Procedure: IRRIGATION AND DEBRIDEMENT HIP;  Surgeon: Garald Balding, MD;  Location: [place];  Service: Orthopedics;  Laterality: Right;  RIGHT HIP EXPLORATION, EXCISION OF DEEP BURSAL [place] STONE SURGERY  2011   R   KNEE ARTHROSCOPY     Left   PROSTATE BIOPSY     SHOULDER ARTHROSCOPY     Left   TOTAL HIP ARTHROPLASTY     Right   TOTAL HIP REVISION     Right   TOTAL KNEE ARTHROPLASTY Right 03/31/2014   Procedure: TOTAL KNEE ARTHROPLASTY;  Surgeon: Garald Balding, MD;  Location: [place];  Service: Orthopedics;  Laterality: Right;   TOTAL KNEE ARTHROPLASTY Left 08/01/2016   Procedure: TOTAL KNEE ARTHROPLASTY;  Surgeon: Garald Balding, MD;  Location: [place];  Service: Orthopedics;  Laterality: Left;   WRIST SURGERY     bilateral, plates    FAMHx:  Family History  Problem Relation Age of Onset   Heart disease Mother    Heart attack Mother    Suicidality Mother    Throat cancer Brother    Throat cancer Paternal Aunt    Anesthesia problems Neg Hx    Hypotension Neg Hx    Malignant hyperthermia Neg Hx    Pseudochol deficiency Neg Hx    Breast cancer Neg Hx    Prostate cancer Neg Hx    Colon cancer Neg Hx    Pancreatic cancer Neg Hx     SOCHx:   reports that he has never smoked. He has never used smokeless tobacco. He reports that he does not drink alcohol and does not use  drugs.  ALLERGIES:  Allergies  Allergen Reactions   Erythromycin Base Anaphylaxis   Sulfur Anaphylaxis   Tetracycline Anaphylaxis   Tape Other (See Comments)    Adhesive tape blisters   Tetracycline Hcl     Other reaction(s): throat swelling:tongue to break out   Ciprofloxacin Other (See Comments)    "IT BROKE MY TONGUE OUT AND CAUSE SORES IN MY THROAT"   Erythromycin Rash   Sulfa Antibiotics Rash   Tetracyclines & Related Rash    ROS: Pertinent items noted in HPI and remainder of comprehensive ROS otherwise negative.  HOME MEDS: Current Outpatient Medications  Medication Sig Dispense Refill   albuterol (VENTOLIN HFA) 108 (90 Base) MCG/ACT inhaler Inhale 2 puffs into the lungs every 6 (six) hours as needed for wheezing or shortness of breath. Future refills from PCP. 1 g 0   Ascorbic Acid (VITAMIN C) 500 MG tablet Take 1 tablet (500 mg total) by mouth daily. 30 tablet    degarelix (FIRMAGON) 80 MG injection Inject 80 mg into the skin every 28 (twenty-eight) days.     diclofenac sodium (VOLTAREN) 1 % GEL Apply 4 g topically 3 (three) times daily as needed (Apply to large joint up to 3 times daily as needed). 3 Tube 3   flecainide (TAMBOCOR) 50 MG tablet Take 1 tablet (50 mg total) by mouth 2 (two) times daily. 180 tablet 2   losartan (COZAAR) 25 MG tablet TAKE ONE TABLET BY MOUTH ONCE DAILY 90 tablet 0   metoprolol succinate (TOPROL-XL) 25 MG 24 hr tablet TAKE 1 TABLET ONCE DAILY. 90 tablet 3   nystatin cream (MYCOSTATIN) Apply 1 application topically 2 (two) times daily.     oxyCODONE (OXY IR/ROXICODONE) 5 MG immediate release tablet Take 5 mg by mouth every 4 (four) hours as needed for severe pain.     potassium chloride  SA (KLOR-CON) 20 MEQ tablet TAKE 1 TABLET ONCE DAILY. 90 tablet 3   predniSONE (DELTASONE) 5 MG tablet Take 5 mg by mouth daily.     tamsulosin (FLOMAX) 0.4 MG CAPS capsule Take 1 capsule (0.4 mg total) by mouth 2 (two) times daily. 60 capsule 0   traMADol  (ULTRAM) 50 MG tablet Take 50 mg by mouth every 6 (six) hours as needed (pain).     No current facility-administered medications for this visit.    LABS/IMAGING: No results found for this or any previous visit (from the past 48 hour(s)).  No results found.  WEIGHTS: Wt Readings from Last 3 Encounters:  11/04/21 234 lb 12.8 oz (106.5 kg)  09/23/21 231 lb 12.8 oz (105.1 kg)  08/23/21 233 lb 9.6 oz (106 kg)    VITALS: BP (!) 114/59    Pulse (!) 58    Ht 6\' 4"  (1.93 m)    Wt 234 lb 12.8 oz (106.5 kg)    SpO2 96%    BMI 28.58 kg/m   EXAM: General appearance: alert and no distress Neck: no carotid bruit and no JVD Lungs: clear to auscultation bilaterally Heart: regular rate and rhythm, S1, S2 normal, no murmur, click, rub or gallop Abdomen: soft, non-tender; bowel sounds normal; no masses,  no organomegaly Extremities: extremities normal, atraumatic, no cyanosis or edema Pulses: 2+ and symmetric Skin: Skin color, texture, turgor normal. No rashes or lesions Neurologic: Grossly normal Psych: Pleasant  EKG: Deferred  ASSESSMENT: Symptomatic bigeminal PVCs - suppressed on flecainide Acute systolic heart failure-suspect PVC mediated, LVEF improved to 50-55% (05/2019) Shortness of breath RBBB Hypertension Mets prostate cancer  PLAN: 1.   Mr. Sikorski continues to do well without any symptomatic PVCs.  He follows up with Dr. Curt Bears in May.  Unfortunately struggling with metastatic prostate cancer and back pain associated with that.  He has gotten antiandrogen therapy.  Hopefully he will respond.  Follow-up 6 months.  Pixie Casino, MD, [facility], [place] Director of the [facility] Lipid [facility] &  Cardiovascular [facility] Reduction Clinic Diplomate of the American Board of Clinical Lipidology Attending Cardiologist  Direct Dial: [phone]   Fax: [phone]  Website:  [email or web address]  Nadean Corwin Elleah Hemsley 11/04/2021, 10:30 AM

## 2021-11-04 NOTE — Patient Instructions (Signed)
Medication Instructions:  [name] Please refer to the Current Medication list given to you today. ? [name], please call your pharmacy* ? [name] [name], you and your health needs are our priority.  As part of our continuing mission to provide you with exceptional heart care, we have created designated Provider Care Teams.  These Care Teams include your primary Cardiologist (physician) and Advanced Practice Providers (APPs -  Physician Assistants and Nurse Practitioners) who all work together to provide you with the care you need, when you need it. ? [name]  Sign up information is provided on this After Visit Summary.  MyChart is used to connect with patients for Virtual Visits (Telemedicine).  Patients are able to view lab/test results, encounter notes, upcoming appointments, etc.  Non-urgent messages can be sent to your provider as well.   ?To learn more about what you can do with MyChart, go to [email or web address].   ? ?Your next appointment:   ? ?[name] ? ?

## 2021-11-09 DIAGNOSIS — Z79891 Long term (current) use of opiate analgesic: Secondary | ICD-10-CM | POA: Diagnosis not present

## 2021-11-09 DIAGNOSIS — M549 Dorsalgia, unspecified: Secondary | ICD-10-CM | POA: Diagnosis not present

## 2021-11-09 DIAGNOSIS — C7951 Secondary malignant neoplasm of bone: Secondary | ICD-10-CM | POA: Diagnosis not present

## 2021-11-09 DIAGNOSIS — Z192 Hormone resistant malignancy status: Secondary | ICD-10-CM | POA: Diagnosis not present

## 2021-11-09 DIAGNOSIS — C61 Malignant neoplasm of prostate: Secondary | ICD-10-CM | POA: Diagnosis not present

## 2021-11-09 DIAGNOSIS — M79604 Pain in right leg: Secondary | ICD-10-CM | POA: Diagnosis not present

## 2021-11-09 DIAGNOSIS — Z79899 Other long term (current) drug therapy: Secondary | ICD-10-CM | POA: Diagnosis not present

## 2021-12-12 DIAGNOSIS — I4510 Unspecified right bundle-branch block: Secondary | ICD-10-CM | POA: Diagnosis not present

## 2021-12-12 DIAGNOSIS — Z9842 Cataract extraction status, left eye: Secondary | ICD-10-CM | POA: Diagnosis not present

## 2021-12-12 DIAGNOSIS — C61 Malignant neoplasm of prostate: Secondary | ICD-10-CM | POA: Diagnosis not present

## 2021-12-12 DIAGNOSIS — Z96653 Presence of artificial knee joint, bilateral: Secondary | ICD-10-CM | POA: Diagnosis not present

## 2021-12-12 DIAGNOSIS — G893 Neoplasm related pain (acute) (chronic): Secondary | ICD-10-CM | POA: Diagnosis not present

## 2021-12-12 DIAGNOSIS — I110 Hypertensive heart disease with heart failure: Secondary | ICD-10-CM | POA: Diagnosis not present

## 2021-12-12 DIAGNOSIS — R3911 Hesitancy of micturition: Secondary | ICD-10-CM | POA: Diagnosis not present

## 2021-12-12 DIAGNOSIS — R32 Unspecified urinary incontinence: Secondary | ICD-10-CM | POA: Diagnosis not present

## 2021-12-12 DIAGNOSIS — I493 Ventricular premature depolarization: Secondary | ICD-10-CM | POA: Diagnosis not present

## 2021-12-12 DIAGNOSIS — B356 Tinea cruris: Secondary | ICD-10-CM | POA: Diagnosis not present

## 2021-12-12 DIAGNOSIS — N200 Calculus of kidney: Secondary | ICD-10-CM | POA: Diagnosis not present

## 2021-12-12 DIAGNOSIS — Z9841 Cataract extraction status, right eye: Secondary | ICD-10-CM | POA: Diagnosis not present

## 2021-12-12 DIAGNOSIS — Z87442 Personal history of urinary calculi: Secondary | ICD-10-CM | POA: Diagnosis not present

## 2021-12-12 DIAGNOSIS — J90 Pleural effusion, not elsewhere classified: Secondary | ICD-10-CM | POA: Diagnosis not present

## 2021-12-12 DIAGNOSIS — I429 Cardiomyopathy, unspecified: Secondary | ICD-10-CM | POA: Diagnosis not present

## 2021-12-12 DIAGNOSIS — Z8546 Personal history of malignant neoplasm of prostate: Secondary | ICD-10-CM | POA: Diagnosis not present

## 2021-12-12 DIAGNOSIS — I5033 Acute on chronic diastolic (congestive) heart failure: Secondary | ICD-10-CM | POA: Diagnosis not present

## 2021-12-12 DIAGNOSIS — M545 Low back pain, unspecified: Secondary | ICD-10-CM | POA: Diagnosis not present

## 2021-12-12 DIAGNOSIS — J9601 Acute respiratory failure with hypoxia: Secondary | ICD-10-CM | POA: Diagnosis not present

## 2021-12-12 DIAGNOSIS — M79652 Pain in left thigh: Secondary | ICD-10-CM | POA: Diagnosis not present

## 2021-12-12 DIAGNOSIS — I440 Atrioventricular block, first degree: Secondary | ICD-10-CM | POA: Diagnosis not present

## 2021-12-12 DIAGNOSIS — Z961 Presence of intraocular lens: Secondary | ICD-10-CM | POA: Diagnosis not present

## 2021-12-12 DIAGNOSIS — R918 Other nonspecific abnormal finding of lung field: Secondary | ICD-10-CM | POA: Diagnosis not present

## 2021-12-12 DIAGNOSIS — Z9981 Dependence on supplemental oxygen: Secondary | ICD-10-CM | POA: Diagnosis not present

## 2021-12-12 DIAGNOSIS — R0600 Dyspnea, unspecified: Secondary | ICD-10-CM | POA: Diagnosis not present

## 2021-12-12 DIAGNOSIS — C7951 Secondary malignant neoplasm of bone: Secondary | ICD-10-CM | POA: Diagnosis not present

## 2021-12-12 DIAGNOSIS — M9963 Osseous and subluxation stenosis of intervertebral foramina of lumbar region: Secondary | ICD-10-CM | POA: Diagnosis not present

## 2021-12-12 DIAGNOSIS — M549 Dorsalgia, unspecified: Secondary | ICD-10-CM | POA: Diagnosis not present

## 2021-12-12 DIAGNOSIS — I428 Other cardiomyopathies: Secondary | ICD-10-CM | POA: Diagnosis not present

## 2021-12-12 DIAGNOSIS — I10 Essential (primary) hypertension: Secondary | ICD-10-CM | POA: Diagnosis not present

## 2021-12-12 DIAGNOSIS — Z96641 Presence of right artificial hip joint: Secondary | ICD-10-CM | POA: Diagnosis not present

## 2021-12-12 DIAGNOSIS — Z923 Personal history of irradiation: Secondary | ICD-10-CM | POA: Diagnosis not present

## 2021-12-13 DIAGNOSIS — C61 Malignant neoplasm of prostate: Secondary | ICD-10-CM | POA: Diagnosis not present

## 2021-12-13 DIAGNOSIS — B356 Tinea cruris: Secondary | ICD-10-CM | POA: Diagnosis not present

## 2021-12-13 DIAGNOSIS — I10 Essential (primary) hypertension: Secondary | ICD-10-CM | POA: Diagnosis not present

## 2021-12-13 DIAGNOSIS — M545 Low back pain, unspecified: Secondary | ICD-10-CM | POA: Diagnosis not present

## 2021-12-13 DIAGNOSIS — I493 Ventricular premature depolarization: Secondary | ICD-10-CM | POA: Diagnosis not present

## 2021-12-13 DIAGNOSIS — I429 Cardiomyopathy, unspecified: Secondary | ICD-10-CM | POA: Diagnosis not present

## 2021-12-14 DIAGNOSIS — I10 Essential (primary) hypertension: Secondary | ICD-10-CM | POA: Diagnosis not present

## 2021-12-14 DIAGNOSIS — C61 Malignant neoplasm of prostate: Secondary | ICD-10-CM | POA: Diagnosis not present

## 2021-12-14 DIAGNOSIS — B356 Tinea cruris: Secondary | ICD-10-CM | POA: Diagnosis not present

## 2021-12-14 DIAGNOSIS — C7951 Secondary malignant neoplasm of bone: Secondary | ICD-10-CM | POA: Diagnosis not present

## 2021-12-14 DIAGNOSIS — M545 Low back pain, unspecified: Secondary | ICD-10-CM | POA: Diagnosis not present

## 2021-12-14 DIAGNOSIS — I4510 Unspecified right bundle-branch block: Secondary | ICD-10-CM | POA: Diagnosis not present

## 2021-12-14 DIAGNOSIS — I493 Ventricular premature depolarization: Secondary | ICD-10-CM | POA: Diagnosis not present

## 2021-12-14 DIAGNOSIS — M9963 Osseous and subluxation stenosis of intervertebral foramina of lumbar region: Secondary | ICD-10-CM | POA: Diagnosis not present

## 2021-12-14 DIAGNOSIS — I440 Atrioventricular block, first degree: Secondary | ICD-10-CM | POA: Diagnosis not present

## 2021-12-14 DIAGNOSIS — I429 Cardiomyopathy, unspecified: Secondary | ICD-10-CM | POA: Diagnosis not present

## 2021-12-15 DIAGNOSIS — I5033 Acute on chronic diastolic (congestive) heart failure: Secondary | ICD-10-CM | POA: Diagnosis not present

## 2021-12-15 DIAGNOSIS — C61 Malignant neoplasm of prostate: Secondary | ICD-10-CM | POA: Diagnosis not present

## 2021-12-15 DIAGNOSIS — I429 Cardiomyopathy, unspecified: Secondary | ICD-10-CM | POA: Diagnosis not present

## 2021-12-15 DIAGNOSIS — B356 Tinea cruris: Secondary | ICD-10-CM | POA: Diagnosis not present

## 2021-12-15 DIAGNOSIS — I493 Ventricular premature depolarization: Secondary | ICD-10-CM | POA: Diagnosis not present

## 2021-12-15 DIAGNOSIS — J9601 Acute respiratory failure with hypoxia: Secondary | ICD-10-CM | POA: Diagnosis not present

## 2021-12-15 DIAGNOSIS — I110 Hypertensive heart disease with heart failure: Secondary | ICD-10-CM | POA: Diagnosis not present

## 2021-12-15 DIAGNOSIS — Z9981 Dependence on supplemental oxygen: Secondary | ICD-10-CM | POA: Diagnosis not present

## 2021-12-15 DIAGNOSIS — M545 Low back pain, unspecified: Secondary | ICD-10-CM | POA: Diagnosis not present

## 2021-12-23 ENCOUNTER — Encounter: Payer: Self-pay | Admitting: Internal Medicine

## 2021-12-23 DIAGNOSIS — I509 Heart failure, unspecified: Secondary | ICD-10-CM

## 2021-12-23 NOTE — Telephone Encounter (Signed)
ECHO ordered-  ?Will notify scheduling to get ECHO scheduled as well as follow up.  ? ?Thanks! ?

## 2021-12-23 NOTE — Telephone Encounter (Signed)
[name]- ? ?Please order a repeat echo for CHF - try to arrange follow-up with me or APP after that - in a few weeks. ? ?Thanks. ? ?Dr Lemmie Evens ?

## 2021-12-27 DIAGNOSIS — C7951 Secondary malignant neoplasm of bone: Secondary | ICD-10-CM | POA: Diagnosis not present

## 2021-12-27 DIAGNOSIS — M545 Low back pain, unspecified: Secondary | ICD-10-CM | POA: Diagnosis not present

## 2021-12-27 DIAGNOSIS — G893 Neoplasm related pain (acute) (chronic): Secondary | ICD-10-CM | POA: Diagnosis not present

## 2021-12-27 DIAGNOSIS — C61 Malignant neoplasm of prostate: Secondary | ICD-10-CM | POA: Diagnosis not present

## 2022-01-02 DIAGNOSIS — C61 Malignant neoplasm of prostate: Secondary | ICD-10-CM | POA: Diagnosis not present

## 2022-01-02 DIAGNOSIS — C7951 Secondary malignant neoplasm of bone: Secondary | ICD-10-CM | POA: Diagnosis not present

## 2022-01-02 DIAGNOSIS — Z510 Encounter for antineoplastic radiation therapy: Secondary | ICD-10-CM | POA: Diagnosis not present

## 2022-01-04 DIAGNOSIS — C7951 Secondary malignant neoplasm of bone: Secondary | ICD-10-CM | POA: Diagnosis not present

## 2022-01-04 DIAGNOSIS — C61 Malignant neoplasm of prostate: Secondary | ICD-10-CM | POA: Diagnosis not present

## 2022-01-04 DIAGNOSIS — Z510 Encounter for antineoplastic radiation therapy: Secondary | ICD-10-CM | POA: Diagnosis not present

## 2022-01-05 ENCOUNTER — Ambulatory Visit (HOSPITAL_COMMUNITY): Payer: Medicare Other | Attending: Cardiology

## 2022-01-05 DIAGNOSIS — I509 Heart failure, unspecified: Secondary | ICD-10-CM | POA: Diagnosis not present

## 2022-01-05 LAB — ECHOCARDIOGRAM COMPLETE
Area-P 1/2: 2.29 cm2
S' Lateral: 3.6 cm

## 2022-01-05 MED ORDER — PERFLUTREN LIPID MICROSPHERE
1.0000 mL | INTRAVENOUS | Status: AC | PRN
  Administered 2022-01-05: 1 mL via INTRAVENOUS

## 2022-01-09 ENCOUNTER — Ambulatory Visit (INDEPENDENT_AMBULATORY_CARE_PROVIDER_SITE_OTHER): Payer: Medicare Other | Admitting: Internal Medicine

## 2022-01-09 ENCOUNTER — Encounter: Payer: Self-pay | Admitting: Internal Medicine

## 2022-01-09 VITALS — BP 135/75 | HR 62 | Ht 76.0 in | Wt 234.8 lb

## 2022-01-09 DIAGNOSIS — I452 Bifascicular block: Secondary | ICD-10-CM

## 2022-01-09 DIAGNOSIS — I428 Other cardiomyopathies: Secondary | ICD-10-CM

## 2022-01-09 DIAGNOSIS — I493 Ventricular premature depolarization: Secondary | ICD-10-CM | POA: Diagnosis not present

## 2022-01-09 DIAGNOSIS — R0609 Other forms of dyspnea: Secondary | ICD-10-CM

## 2022-01-09 NOTE — Patient Instructions (Signed)
Medication Instructions:  [name] Please refer to the Current Medication list given to you today. ? [name], please call your pharmacy* ? [name] ?At [facility], you and your health needs are our priority.  As part of our continuing mission to provide you with exceptional heart care, we have created designated Provider Care Teams.  These Care Teams include your primary Cardiologist (physician) and Advanced Practice Providers (APPs -  Physician Assistants and Nurse Practitioners) who all work together to provide you with the care you need, when you need it. ? [name]  Sign up information is provided on this After Visit Summary.  MyChart is used to connect with patients for Virtual Visits (Telemedicine).  Patients are able to view lab/test results, encounter notes, upcoming appointments, etc.  Non-urgent messages can be sent to your provider as well.   ?To learn more about what you can do with MyChart, go to [email or web address].   ? ?Your next appointment:   ?September as scheduled ? ?

## 2022-01-09 NOTE — Progress Notes (Signed)
? ? ?OFFICE NOTE ? ?Chief Complaint:  ?Follow-up echo/dyspnea ?  ?Primary Care Physician: ?Mitchell, L.Marlou Sa, MD ? ?HPI:  ?[name] is a 87 y.o. male who is a former patient of Dr. Mare Ferrari. His wife is also a patient of mine. He has a history of PVCs and is on medicine for suppression of those PVCs and also possible hypertension. Blood pressure initially 155/83 became down to 120/78. He was noted to have a few PVCs today but said he did not take his medicine this morning. Other than that he denies any medical problems. He has a history of multiple orthopedic surgeries and gallbladder surgery in the past. He has a chronic left-sided chest pain which is sharp and he can point to with his finger. This is along the left sternal border and generally does not move, worsened with exertion or is not relieved by rest. He's had 2 heart catheterizations last 5 years, neither study indicated any significant obstructive coronary disease. ? ?11/23/2016 ? ?[name] returns today for follow-up. He denies any chest pain or worsening shortness of breath. He does have some PVCs however he is unaware of them. He is on once daily metoprolol tartrate. Blood pressure is at goal today 122/70. ? ?05/31/2017 ? ?[name] was seen today in follow-up. He reports 2 episodes of recent chest pain. One episode was associated with stomach upset and pain that radiated up the center of the chest to his neck. He had some associated nausea and diaphoresis. He had a second episode which might of occurred after a spicy meal that was similar although he ended up vomiting after which she felt better. He denied any exercise intolerance, is able to exercise regularly, recently went on a long walking trip vacation and has no symptoms with this activity. He has a history of a false positive stress test in the past to prior cardiac catheterizations in the last 5 years which were negative. ? ?11/14/2017 ? ?[name] comes back today for follow-up.   He is without complaints currently.  He was having atypical chest pain symptoms which I felt more likely GI in etiology.  He ultimately was found to have bile duct stones, although had a history of prior cholecystectomy.  The stones were extracted and he has since improved significantly.  He is in fact off of his PPI medication.  Blood pressures been well controlled.  He is active and denies any chest pain or worsening shortness of breath. ? ?05/20/2018 ? ?[name] seen today in follow-up with his wife.  He continues to be without complaints.  Since he had a bile duct stones removed he is had no further chest discomfort.  He denies any shortness of breath or chest pain.  Blood pressure is at goal. ? ?08/16/2018 ? ?[name] returns today for follow-up.  He has had some persistent palpitations.  EKG today shows PVCs in a bigeminal pattern.  Blood pressure is well controlled on metoprolol.  He also reports some shortness of breath, making me concerned a little about of whether he has developed any cardiomyopathy related to his PVCs.  The exact burden is not clear. ? ?11/04/2018 ? ?[name] returns today for follow-up of his PVCs.  I referred him to Dr. Curt Bears after worsening cardiomyopathy was found to have an EF around 30 to 35%.  He was found also to have a PVC burden of around 15%.  After seeing Dr. Curt Bears, he was placed on flecainide and does feel like he  has had a reduction in his PVCs.  Heart rate is in the 50s today.  Blood pressure is normal.  He is also complaining of nosebleeds.  Also he had had some superficial bleeding of the scrotum, for which she said may have been related to itching and is noted to have conjunctival hemorrhage today which is likely spontaneous. ? ?07/11/2019 ? ?[name] returns today for follow-up.  Fortunately has had improvement in his LVEF now up to 50 to 55% with grade 2 diastolic dysfunction and some atrial enlargement by echo in September.  Symptomatically he feels much  better.  In fact he says he is back to normal now.  He maintains on flecainide for PVCs.  EKG today shows sinus bradycardia with first-degree AV block, RBBB at 52.  QTc is 465 ms.  He denies any recurrent bleeding.  He would like a refill of his albuterol. ? ?01/16/2020 ? ?[name] is seen today in follow-up.  He recently saw Dr. Curt Bears for further evaluation of his PVCs.  They seem to be well suppressed on flecainide.  His EF has improved.  QTC today is 445 ms.  He has a right bundle branch block and first-degree AV block.  He is asymptomatic denying chest pain or worsening shortness of breath.  Blood pressure is at goal. ? ?07/14/2020 ? ?[name] returns today for follow-up.  Recently saw Dr. Curt Bears again and seems to have had good suppression over his PVCs.  EKG has been stable with no evidence of PVCs today and a right bundle branch block at rate of 55.  Blood pressures well controlled.  He is asymptomatic. ? ?04/20/2021 ? ?[name] is seen today in follow-up.  Unfortunately was diagnosed with metastatic prostate cancer back in April.  He has been undergoing radiation and chemotherapy for this.  He says its been fairly rough on him.  Mostly with issues on urination.  He has had no significant issues with PVCs.  He has been seen by Dr. Curt Bears and remains on flecainide.  He denies any chest pain.  No heart failure symptoms. ? ?11/04/2021 ? ?[name] returns today with his wife for follow-up.  He continues to battle metastatic prostate cancer.  He struggling with back pain.  He might get radiation for that.  He reports his PVCs have been pretty well suppressed and due to medication interactions his flecainide had to be reduced by Dr. Curt Bears.  Despite that he is done well.  No new or worsening heart failure symptoms. ? ?01/09/2022 ? ?[name] returns today for follow-up of his shortness of breath.  He noticed that more recently which I felt was probably due to cancer and/or treatments for that.  I did  go ahead and get another echo which shows stable LV function at 50 to 55%.  No evidence for any heart failure.  Rhythm is sinus without PVC's on flecainide.  He has a follow-up with Dr. Curt Bears tomorrow.  QTc today was 4 and 75 ms. ? ?PMHx:  ?Past Medical History:  ?Diagnosis Date  ? Arthritis   ? Asthma   ? only if develops cold  ? Dysrhythmia   ? h/o pvc's  ? GERD (gastroesophageal reflux disease)   ? Kidney stone on left side   ? Prostate cancer ([place])   ? PVC (premature ventricular contraction)   ? Recurrent upper respiratory infection (URI) 03/2011  ? ? ?Past Surgical History:  ?Procedure Laterality Date  ? APPENDECTOMY    ? BACK SURGERY    ?  x 4  ? CARDIAC CATHETERIZATION    ? july 2010-minor irregl  ? CERVICAL SPINE SURGERY    ? x 2  ? CHOLECYSTECTOMY    ? 2008  ? ERCP N/A 07/24/2017  ? Procedure: ENDOSCOPIC RETROGRADE CHOLANGIOPANCREATOGRAPHY (ERCP);  Surgeon: Clarene Essex, MD;  Location: [facility];  Service: Endoscopy;  Laterality: N/A;  ? EYE SURGERY    ? bilat cataract  ? INCISION AND DRAINAGE HIP  07/18/2011  ? Procedure: IRRIGATION AND DEBRIDEMENT HIP;  Surgeon: Garald Balding, MD;  Location: [place];  Service: Orthopedics;  Laterality: Right;  RIGHT HIP EXPLORATION, EXCISION OF DEEP BURSAL SAC  ? KIDNEY STONE SURGERY  2011  ? R  ? KNEE ARTHROSCOPY    ? Left  ? PROSTATE BIOPSY    ? SHOULDER ARTHROSCOPY    ? Left  ? TOTAL HIP ARTHROPLASTY    ? Right  ? TOTAL HIP REVISION    ? Right  ? TOTAL KNEE ARTHROPLASTY Right 03/31/2014  ? Procedure: TOTAL KNEE ARTHROPLASTY;  Surgeon: Garald Balding, MD;  Location: [place];  Service: Orthopedics;  Laterality: Right;  ? TOTAL KNEE ARTHROPLASTY Left 08/01/2016  ? Procedure: TOTAL KNEE ARTHROPLASTY;  Surgeon: Garald Balding, MD;  Location: [place];  Service: Orthopedics;  Laterality: Left;  ? WRIST SURGERY    ? bilateral, plates  ? ? ?FAMHx:  ?Family History  ?Problem Relation Age of Onset  ? Heart disease Mother   ? Heart attack Mother   ? Suicidality Mother   ?  Throat cancer Brother   ? Throat cancer Paternal Aunt   ? Anesthesia problems Neg Hx   ? Hypotension Neg Hx   ? Malignant hyperthermia Neg Hx   ? Pseudochol deficiency Neg Hx   ? Breast cancer Neg Hx   ? Pr

## 2022-01-10 ENCOUNTER — Encounter: Payer: Self-pay | Admitting: Cardiology

## 2022-01-10 ENCOUNTER — Ambulatory Visit (INDEPENDENT_AMBULATORY_CARE_PROVIDER_SITE_OTHER): Payer: Medicare Other | Admitting: Cardiology

## 2022-01-10 VITALS — BP 128/72 | HR 60 | Ht 76.0 in | Wt 236.0 lb

## 2022-01-10 DIAGNOSIS — I493 Ventricular premature depolarization: Secondary | ICD-10-CM

## 2022-01-10 NOTE — Progress Notes (Signed)
? ?Electrophysiology Office Note ? ? ?Date:  01/10/2022  ? ?ID:  [name], DOB 11-May-1935, MRN [id number] ? ?PCP:  Alroy Dust, L.Marlou Sa, MD  ?Cardiologist:  Debara Pickett ?Primary Electrophysiologist:  Julies Carmickle [name], MD   ? ?No chief complaint on file. ? ?  ?History of Present Illness: ?[name] is a 87 y.o. male who is being seen today for evaluation of PVCs at the request of Mali Hilty.  He is presenting for electrophysiology evaluation.   ? ?He has a history significant for PVCs and chronic systolic heart failure.  He he had an echo that showed an ejection fraction of 35 to 40% with the Holter monitoring showed a 15% PVC burden.  He was started on flecainide with significant decrease in his PVC burden and normalization of his ejection fraction.  He was diagnosed with prostate cancer and was started on hormone therapy.  Due to his hormone therapy, his flecainide dose was decreased. ? ?Today, denies symptoms of palpitations, chest pain, shortness of breath, orthopnea, PND, lower extremity edema, claudication, dizziness, presyncope, syncope, bleeding, or neurologic sequela. The patient is tolerating medications without difficulties.  He is feeling well.  He has no chest pain or shortness of breath.  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  He is planning on undergoing radiation to his spine. ? ?Past Medical History:  ?Diagnosis Date  ? Arthritis   ? Asthma   ? only if develops cold  ? Dysrhythmia   ? h/o pvc's  ? GERD (gastroesophageal reflux disease)   ? Kidney stone on left side   ? Prostate cancer ([place])   ? PVC (premature ventricular contraction)   ? Recurrent upper respiratory infection (URI) 03/2011  ? ?Past Surgical History:  ?Procedure Laterality Date  ? APPENDECTOMY    ? BACK SURGERY    ? x 4  ? CARDIAC CATHETERIZATION    ? july 2010-minor irregl  ? CERVICAL SPINE SURGERY    ? x 2  ? CHOLECYSTECTOMY    ? 2008  ? ERCP N/A 07/24/2017  ? Procedure: ENDOSCOPIC  RETROGRADE CHOLANGIOPANCREATOGRAPHY (ERCP);  Surgeon: Clarene Essex, MD;  Location: [facility];  Service: Endoscopy;  Laterality: N/A;  ? EYE SURGERY    ? bilat cataract  ? INCISION AND DRAINAGE HIP  07/18/2011  ? Procedure: IRRIGATION AND DEBRIDEMENT HIP;  Surgeon: Garald Balding, MD;  Location: [facility];  Service: Orthopedics;  Laterality: Right;  RIGHT HIP EXPLORATION, EXCISION OF DEEP BURSAL SAC  ? KIDNEY STONE SURGERY  2011  ? R  ? KNEE ARTHROSCOPY    ? Left  ? PROSTATE BIOPSY    ? SHOULDER ARTHROSCOPY    ? Left  ? TOTAL HIP ARTHROPLASTY    ? Right  ? TOTAL HIP REVISION    ? Right  ? TOTAL KNEE ARTHROPLASTY Right 03/31/2014  ? Procedure: TOTAL KNEE ARTHROPLASTY;  Surgeon: Garald Balding, MD;  Location: [facility];  Service: Orthopedics;  Laterality: Right;  ? TOTAL KNEE ARTHROPLASTY Left 08/01/2016  ? Procedure: TOTAL KNEE ARTHROPLASTY;  Surgeon: Garald Balding, MD;  Location: [facility];  Service: Orthopedics;  Laterality: Left;  ? WRIST SURGERY    ? bilateral, plates  ? ? ? ?Current Outpatient Medications  ?Medication Sig Dispense Refill  ? abiraterone acetate (ZYTIGA) 500 MG tablet Take 500 mg by mouth daily.    ? albuterol (VENTOLIN HFA) 108 (90 Base) MCG/ACT inhaler Inhale 2 puffs into the lungs every 6 (six)  hours as needed for wheezing or shortness of breath. Future refills from PCP. 1 g 0  ? Ascorbic Acid (VITAMIN C) 500 MG tablet Take 1 tablet (500 mg total) by mouth daily. 30 tablet   ? CALCIUM PO Take 2 capsules by mouth daily.    ? clotrimazole (LOTRIMIN) 1 % cream Apply topically.    ? degarelix (FIRMAGON) 80 MG injection Inject 80 mg into the skin every 28 (twenty-eight) days.    ? diclofenac sodium (VOLTAREN) 1 % GEL Apply 4 g topically 3 (three) times daily as needed (Apply to large joint up to 3 times daily as needed). 3 Tube 3  ? flecainide (TAMBOCOR) 50 MG tablet Take 1 tablet (50 mg total) by mouth 2 (two) times daily. 180 tablet 2  ? gabapentin (NEURONTIN) 100 MG capsule Take 100 mg by mouth 3  (three) times daily.    ? ibuprofen (ADVIL) 200 MG tablet Take 200 mg by mouth every 8 (eight) hours as needed (pain).    ? losartan (COZAAR) 25 MG tablet TAKE ONE TABLET BY MOUTH ONCE DAILY 90 tablet 0  ? metoprolol succinate (TOPROL-XL) 25 MG 24 hr tablet TAKE 1 TABLET ONCE DAILY. 90 tablet 3  ? nystatin cream (MYCOSTATIN) Apply 1 application topically 2 (two) times daily.    ? oxyCODONE (OXY IR/ROXICODONE) 5 MG immediate release tablet Take 5 mg by mouth every 4 (four) hours as needed for severe pain.    ? potassium chloride SA (KLOR-CON) 20 MEQ tablet TAKE 1 TABLET ONCE DAILY. 90 tablet 3  ? predniSONE (DELTASONE) 5 MG tablet Take 5 mg by mouth daily.    ? tamsulosin (FLOMAX) 0.4 MG CAPS capsule Take 1 capsule (0.4 mg total) by mouth 2 (two) times daily. 60 capsule 0  ? traMADol (ULTRAM) 50 MG tablet Take 50 mg by mouth every 6 (six) hours as needed (pain).    ? ?No current facility-administered medications for this visit.  ? ? ?Allergies:   Erythromycin base, Sulfur, Tetracycline, Tape, Tetracycline hcl, Ciprofloxacin, Erythromycin, Sulfa antibiotics, and Tetracyclines & related  ? ?Social History:  The patient  reports that he has never smoked. He has never used smokeless tobacco. He reports that he does not drink alcohol and does not use drugs.  ? ?Family History:  The patient's family history includes Heart attack in his mother; Heart disease in his mother; Suicidality in his mother; Throat cancer in his brother and paternal aunt.  ? ?ROS:  Please see the history of present illness.   Otherwise, review of systems is positive for none.   All other systems are reviewed and negative.  ? ?PHYSICAL EXAM: ?VS:  BP 128/72   Pulse 60   Ht '6\' 4"'$  (1.93 m)   Wt 236 lb (107 kg)   SpO2 92%   BMI 28.73 kg/m?  , BMI Body mass index is 28.73 kg/m?. ?GEN: Well nourished, well developed, in no acute distress  ?HEENT: normal  ?Neck: no JVD, carotid bruits, or masses ?Cardiac: RRR; no murmurs, rubs, or gallops,no edema   ?Respiratory:  clear to auscultation bilaterally, normal work of breathing ?GI: soft, nontender, nondistended, + BS ?MS: no deformity or atrophy  ?Skin: warm and dry ?Neuro:  Strength and sensation are intact ?Psych: euthymic mood, full affect ? ?EKG:  EKG is not ordered today. ?Personal review of the ekg ordered 09/23/21 shows sinus rhythm, right bundle branch block, rate 58 ? ?Recent Labs: ?09/23/2021: BUN 19; Creatinine, Ser 0.98; Hemoglobin 14.8; Platelets 104; Potassium  4.2; Sodium 143  ? ? ?Lipid Panel  ?   ?Component Value Date/Time  ? CHOL 164 10/15/2015 0950  ? TRIG 115 10/15/2015 0950  ? HDL 47 10/15/2015 0950  ? CHOLHDL 3.5 10/15/2015 0950  ? VLDL 23 10/15/2015 0950  ? [place] 94 10/15/2015 0950  ? ? ? ?Wt Readings from Last 3 Encounters:  ?01/10/22 236 lb (107 kg)  ?01/09/22 234 lb 12.8 oz (106.5 kg)  ?11/04/21 234 lb 12.8 oz (106.5 kg)  ?  ? ? ?Other studies Reviewed: ?Additional studies/ records that were reviewed today include: TTE 08/26/18  ?Review of the above records today demonstrates:  ? 1. The left ventricle has low normal systolic function, with an ejection  ?fraction of 50-55%. The cavity size was normal. There is mildly increased  ?left ventricular wall thickness. Left ventricular diastolic Doppler  ?parameters are consistent with  ?pseudonormalization.  ? 2. The right ventricle has normal systolic function. The cavity was  ?normal. There is no increase in right ventricular wall thickness.  ? 3. Left atrial size was moderately dilated.  ? 4. The aortic valve is grossly normal. Mild thickening of the aortic  ?valve. Mild calcification of the aortic valve. No stenosis of the aortic  ?valve.  ? 5. The aorta is normal unless otherwise noted.  ? 6. The aortic root and ascending aorta are normal in size and structure.  ? 7. The atrial septum is grossly normal.  ? ?Holter 08/30/18 - personally reviewed ?Sinus rhythm with frequent PVC's (burden ~15%) and infrequent PAC's. ? ?ASSESSMENT AND PLAN: ? ?1.   PVCs: Currently on flecainide 50 mg twice daily.  High risk medication monitoring today via ECG.  His QRS is wide and his right bundle branch block.  He had an ECG done yesterday.  We Dick Hark evaluate his QRS once

## 2022-01-12 DIAGNOSIS — Z510 Encounter for antineoplastic radiation therapy: Secondary | ICD-10-CM | POA: Diagnosis not present

## 2022-01-12 DIAGNOSIS — C7951 Secondary malignant neoplasm of bone: Secondary | ICD-10-CM | POA: Diagnosis not present

## 2022-01-12 DIAGNOSIS — C61 Malignant neoplasm of prostate: Secondary | ICD-10-CM | POA: Diagnosis not present

## 2022-01-13 DIAGNOSIS — C61 Malignant neoplasm of prostate: Secondary | ICD-10-CM | POA: Diagnosis not present

## 2022-01-13 DIAGNOSIS — Z510 Encounter for antineoplastic radiation therapy: Secondary | ICD-10-CM | POA: Diagnosis not present

## 2022-01-13 DIAGNOSIS — C7951 Secondary malignant neoplasm of bone: Secondary | ICD-10-CM | POA: Diagnosis not present

## 2022-01-16 DIAGNOSIS — C7951 Secondary malignant neoplasm of bone: Secondary | ICD-10-CM | POA: Diagnosis not present

## 2022-01-16 DIAGNOSIS — C61 Malignant neoplasm of prostate: Secondary | ICD-10-CM | POA: Diagnosis not present

## 2022-01-16 DIAGNOSIS — Z510 Encounter for antineoplastic radiation therapy: Secondary | ICD-10-CM | POA: Diagnosis not present

## 2022-01-17 DIAGNOSIS — C7951 Secondary malignant neoplasm of bone: Secondary | ICD-10-CM | POA: Diagnosis not present

## 2022-01-17 DIAGNOSIS — C61 Malignant neoplasm of prostate: Secondary | ICD-10-CM | POA: Diagnosis not present

## 2022-01-17 DIAGNOSIS — Z510 Encounter for antineoplastic radiation therapy: Secondary | ICD-10-CM | POA: Diagnosis not present

## 2022-01-18 DIAGNOSIS — C7951 Secondary malignant neoplasm of bone: Secondary | ICD-10-CM | POA: Diagnosis not present

## 2022-01-18 DIAGNOSIS — C61 Malignant neoplasm of prostate: Secondary | ICD-10-CM | POA: Diagnosis not present

## 2022-01-18 DIAGNOSIS — Z510 Encounter for antineoplastic radiation therapy: Secondary | ICD-10-CM | POA: Diagnosis not present

## 2022-01-19 ENCOUNTER — Other Ambulatory Visit: Payer: Self-pay | Admitting: Internal Medicine

## 2022-01-19 DIAGNOSIS — Z510 Encounter for antineoplastic radiation therapy: Secondary | ICD-10-CM | POA: Diagnosis not present

## 2022-01-19 DIAGNOSIS — C7951 Secondary malignant neoplasm of bone: Secondary | ICD-10-CM | POA: Diagnosis not present

## 2022-01-19 DIAGNOSIS — I119 Hypertensive heart disease without heart failure: Secondary | ICD-10-CM

## 2022-01-19 DIAGNOSIS — C61 Malignant neoplasm of prostate: Secondary | ICD-10-CM | POA: Diagnosis not present

## 2022-01-20 DIAGNOSIS — Z510 Encounter for antineoplastic radiation therapy: Secondary | ICD-10-CM | POA: Diagnosis not present

## 2022-01-20 DIAGNOSIS — C7951 Secondary malignant neoplasm of bone: Secondary | ICD-10-CM | POA: Diagnosis not present

## 2022-01-20 DIAGNOSIS — C61 Malignant neoplasm of prostate: Secondary | ICD-10-CM | POA: Diagnosis not present

## 2022-01-23 DIAGNOSIS — C61 Malignant neoplasm of prostate: Secondary | ICD-10-CM | POA: Diagnosis not present

## 2022-01-23 DIAGNOSIS — Z510 Encounter for antineoplastic radiation therapy: Secondary | ICD-10-CM | POA: Diagnosis not present

## 2022-01-23 DIAGNOSIS — C7951 Secondary malignant neoplasm of bone: Secondary | ICD-10-CM | POA: Diagnosis not present

## 2022-01-24 DIAGNOSIS — C61 Malignant neoplasm of prostate: Secondary | ICD-10-CM | POA: Diagnosis not present

## 2022-01-24 DIAGNOSIS — Z510 Encounter for antineoplastic radiation therapy: Secondary | ICD-10-CM | POA: Diagnosis not present

## 2022-01-24 DIAGNOSIS — C7951 Secondary malignant neoplasm of bone: Secondary | ICD-10-CM | POA: Diagnosis not present

## 2022-01-25 DIAGNOSIS — Z510 Encounter for antineoplastic radiation therapy: Secondary | ICD-10-CM | POA: Diagnosis not present

## 2022-01-25 DIAGNOSIS — C61 Malignant neoplasm of prostate: Secondary | ICD-10-CM | POA: Diagnosis not present

## 2022-01-25 DIAGNOSIS — C7951 Secondary malignant neoplasm of bone: Secondary | ICD-10-CM | POA: Diagnosis not present

## 2022-01-27 DIAGNOSIS — C61 Malignant neoplasm of prostate: Secondary | ICD-10-CM | POA: Diagnosis not present

## 2022-01-27 DIAGNOSIS — C7951 Secondary malignant neoplasm of bone: Secondary | ICD-10-CM | POA: Diagnosis not present

## 2022-01-27 DIAGNOSIS — N281 Cyst of kidney, acquired: Secondary | ICD-10-CM | POA: Diagnosis not present

## 2022-01-27 DIAGNOSIS — I2510 Atherosclerotic heart disease of native coronary artery without angina pectoris: Secondary | ICD-10-CM | POA: Diagnosis not present

## 2022-02-03 ENCOUNTER — Telehealth: Payer: Self-pay | Admitting: Physical Medicine and Rehabilitation

## 2022-02-03 DIAGNOSIS — C61 Malignant neoplasm of prostate: Secondary | ICD-10-CM | POA: Diagnosis not present

## 2022-02-03 NOTE — Telephone Encounter (Signed)
Patient called advised he would like to get an appointment for another injection in his left hip. Patient said it  the injection last for 4 months. The number to contact patient is [phone]

## 2022-02-08 DIAGNOSIS — N281 Cyst of kidney, acquired: Secondary | ICD-10-CM | POA: Diagnosis not present

## 2022-02-08 DIAGNOSIS — R61 Generalized hyperhidrosis: Secondary | ICD-10-CM | POA: Diagnosis not present

## 2022-02-08 DIAGNOSIS — R350 Frequency of micturition: Secondary | ICD-10-CM | POA: Diagnosis not present

## 2022-02-08 DIAGNOSIS — R319 Hematuria, unspecified: Secondary | ICD-10-CM | POA: Diagnosis not present

## 2022-02-08 DIAGNOSIS — Z79899 Other long term (current) drug therapy: Secondary | ICD-10-CM | POA: Diagnosis not present

## 2022-02-08 DIAGNOSIS — C7951 Secondary malignant neoplasm of bone: Secondary | ICD-10-CM | POA: Diagnosis not present

## 2022-02-08 DIAGNOSIS — Z7952 Long term (current) use of systemic steroids: Secondary | ICD-10-CM | POA: Diagnosis not present

## 2022-02-08 DIAGNOSIS — Z923 Personal history of irradiation: Secondary | ICD-10-CM | POA: Diagnosis not present

## 2022-02-08 DIAGNOSIS — C61 Malignant neoplasm of prostate: Secondary | ICD-10-CM | POA: Diagnosis not present

## 2022-02-08 DIAGNOSIS — Z192 Hormone resistant malignancy status: Secondary | ICD-10-CM | POA: Diagnosis not present

## 2022-02-13 ENCOUNTER — Ambulatory Visit: Payer: Medicare Other

## 2022-02-13 ENCOUNTER — Ambulatory Visit (INDEPENDENT_AMBULATORY_CARE_PROVIDER_SITE_OTHER): Payer: Medicare Other | Admitting: Physical Medicine and Rehabilitation

## 2022-02-13 ENCOUNTER — Encounter: Payer: Self-pay | Admitting: Physical Medicine and Rehabilitation

## 2022-02-13 DIAGNOSIS — M25552 Pain in left hip: Secondary | ICD-10-CM

## 2022-02-13 NOTE — Progress Notes (Unsigned)
Pt state left hip pain. Pt state walking, standing and sitting makes the pain worse. Pt stat he take [place] meds to help ease his pain. Pt has hx of cancer in both hips.  Numeric Pain Rating Scale and Functional Assessment Average Pain 6   In the last MONTH (on 0-10 scale) has pain interfered with the following?  1. General activity like being  able to carry out your everyday physical activities such as walking, climbing stairs, carrying groceries, or moving a chair?  Rating(10)   +Driver, -BT, -Dye Allergies.

## 2022-02-13 NOTE — Progress Notes (Signed)
   [name] - 87 y.o. male MRN [id number]  Date of birth: November 11, 1934  Office Visit Note: Visit Date: 02/13/2022 PCP: Alroy Dust, L.Marlou Sa, MD Referred by: Alroy Dust, L.Marlou Sa, MD  Subjective: Chief Complaint  Patient presents with  . Left Hip - Pain   HPI:  [name] is a 87 y.o. male who comes in today for planned repeat Left anesthetic hip arthrogram with fluoroscopic guidance.  The patient has failed conservative care including home exercise, medications, time and activity modification. Prior injection gave more than 50% relief for several months. This injection will be diagnostic and hopefully therapeutic.  Please see requesting physician notes for further details and justification.  Referring: Dr. [name]  ROS Otherwise per HPI.  Assessment & Plan: Visit Diagnoses:    ICD-10-CM   1. Pain in left hip  M25.552 XR C-ARM NO REPORT      Plan: No additional findings.   Meds & Orders: No orders of the defined types were placed in this encounter.   Orders Placed This Encounter  Procedures  . Large Joint Inj  . XR C-ARM NO REPORT    Follow-up: Return if symptoms worsen or fail to improve.   Procedures: Large Joint Inj: L hip joint on 02/13/2022 2:03 PM Indications: diagnostic evaluation and pain Details: 22 G 3.5 in needle, fluoroscopy-guided anterior approach  Arthrogram: No  Medications: 4 mL bupivacaine 0.25 %; 60 mg triamcinolone acetonide 40 MG/ML Outcome: tolerated well, no immediate complications  There was excellent flow of contrast producing a partial arthrogram of the hip. The patient did have relief of symptoms during the anesthetic phase of the injection. Procedure, treatment alternatives, risks and benefits explained, specific risks discussed. Consent was given by the patient. Immediately prior to procedure a time out was called to verify the correct patient, procedure, equipment, support staff and site/side marked as required. Patient was prepped  and draped in the usual sterile fashion.         Clinical History: No specialty comments available.     Objective:  VS:  HT:    WT:   BMI:     BP:   HR: bpm  TEMP: ( )  RESP:  Physical Exam   Imaging: No results found.

## 2022-02-15 DIAGNOSIS — C61 Malignant neoplasm of prostate: Secondary | ICD-10-CM | POA: Diagnosis not present

## 2022-02-15 DIAGNOSIS — D4959 Neoplasm of unspecified behavior of other genitourinary organ: Secondary | ICD-10-CM | POA: Diagnosis not present

## 2022-02-15 MED ORDER — BUPIVACAINE HCL 0.25 % IJ SOLN
4.0000 mL | INTRAMUSCULAR | Status: AC | PRN
  Administered 2022-02-13: 4 mL via INTRA_ARTICULAR

## 2022-02-15 MED ORDER — TRIAMCINOLONE ACETONIDE 40 MG/ML IJ SUSP
60.0000 mg | INTRAMUSCULAR | Status: AC | PRN
  Administered 2022-02-13: 60 mg via INTRA_ARTICULAR

## 2022-02-16 DIAGNOSIS — C61 Malignant neoplasm of prostate: Secondary | ICD-10-CM | POA: Diagnosis not present

## 2022-02-28 DIAGNOSIS — C61 Malignant neoplasm of prostate: Secondary | ICD-10-CM | POA: Diagnosis not present

## 2022-03-03 DIAGNOSIS — B029 Zoster without complications: Secondary | ICD-10-CM | POA: Diagnosis not present

## 2022-03-03 DIAGNOSIS — C61 Malignant neoplasm of prostate: Secondary | ICD-10-CM | POA: Diagnosis not present

## 2022-03-15 DIAGNOSIS — C61 Malignant neoplasm of prostate: Secondary | ICD-10-CM | POA: Diagnosis not present

## 2022-03-22 DIAGNOSIS — Z79899 Other long term (current) drug therapy: Secondary | ICD-10-CM | POA: Diagnosis not present

## 2022-03-22 DIAGNOSIS — C7951 Secondary malignant neoplasm of bone: Secondary | ICD-10-CM | POA: Diagnosis not present

## 2022-03-22 DIAGNOSIS — Z192 Hormone resistant malignancy status: Secondary | ICD-10-CM | POA: Diagnosis not present

## 2022-03-22 DIAGNOSIS — C61 Malignant neoplasm of prostate: Secondary | ICD-10-CM | POA: Diagnosis not present

## 2022-03-22 DIAGNOSIS — Z79818 Long term (current) use of other agents affecting estrogen receptors and estrogen levels: Secondary | ICD-10-CM | POA: Diagnosis not present

## 2022-03-22 DIAGNOSIS — G893 Neoplasm related pain (acute) (chronic): Secondary | ICD-10-CM | POA: Diagnosis not present

## 2022-03-22 DIAGNOSIS — Z923 Personal history of irradiation: Secondary | ICD-10-CM | POA: Diagnosis not present

## 2022-03-22 DIAGNOSIS — Z79891 Long term (current) use of opiate analgesic: Secondary | ICD-10-CM | POA: Diagnosis not present

## 2022-04-03 ENCOUNTER — Encounter: Payer: Self-pay | Admitting: Physical Medicine and Rehabilitation

## 2022-04-10 ENCOUNTER — Ambulatory Visit (INDEPENDENT_AMBULATORY_CARE_PROVIDER_SITE_OTHER): Payer: Medicare Other | Admitting: Physical Medicine and Rehabilitation

## 2022-04-10 ENCOUNTER — Encounter: Payer: Self-pay | Admitting: Physical Medicine and Rehabilitation

## 2022-04-10 ENCOUNTER — Ambulatory Visit: Payer: Medicare Other

## 2022-04-10 DIAGNOSIS — M25552 Pain in left hip: Secondary | ICD-10-CM

## 2022-04-10 DIAGNOSIS — C61 Malignant neoplasm of prostate: Secondary | ICD-10-CM | POA: Diagnosis not present

## 2022-04-10 NOTE — Progress Notes (Signed)
Pt state left hip pain. Pt state walking, standing and sitting makes the pain worse. Pt stat he take [place] meds to help ease his pain. Pt has hx of cancer in both hips.  Numeric Pain Rating Scale and Functional Assessment Average Pain 7   In the last MONTH (on 0-10 scale) has pain interfered with the following?  1. General activity like being  able to carry out your everyday physical activities such as walking, climbing stairs, carrying groceries, or moving a chair?  Rating(9)   -BT, -Dye Allergies.

## 2022-04-12 DIAGNOSIS — C61 Malignant neoplasm of prostate: Secondary | ICD-10-CM | POA: Diagnosis not present

## 2022-04-17 DIAGNOSIS — D4959 Neoplasm of unspecified behavior of other genitourinary organ: Secondary | ICD-10-CM | POA: Diagnosis not present

## 2022-04-19 MED ORDER — BUPIVACAINE HCL 0.25 % IJ SOLN
4.0000 mL | INTRAMUSCULAR | Status: AC | PRN
  Administered 2022-04-10: 4 mL via INTRA_ARTICULAR

## 2022-04-19 MED ORDER — TRIAMCINOLONE ACETONIDE 40 MG/ML IJ SUSP
60.0000 mg | INTRAMUSCULAR | Status: AC | PRN
  Administered 2022-04-10: 60 mg via INTRA_ARTICULAR

## 2022-04-19 NOTE — Progress Notes (Signed)
   ALEXSANDER CAVINS - 87 y.o. male MRN [id number]  Date of birth: 06-May-1935  Office Visit Note: Visit Date: 04/10/2022 PCP: Alroy Dust, L.Marlou Sa, MD Referred by: Alroy Dust, L.Marlou Sa, MD  Subjective: Chief Complaint  Patient presents with   Left Hip - Pain   HPI:  [place] SHELLHAMMER is a 87 y.o. male who comes in today for planned repeat Left anesthetic hip arthrogram with fluoroscopic guidance.  The patient has failed conservative care including home exercise, medications, time and activity modification. Prior injection gave more than 50% relief for several months. This injection will be diagnostic and hopefully therapeutic.  Please see requesting physician notes for further details and justification.  Patient with end-stage hip osteoarthritis but also with terminal prostate cancer.  He did well with prior injection and obviously this is something we can do from a palliative standpoint without worrying too much about interim time between injections.  Referring: Dr. [name]   ROS Otherwise per HPI.  Assessment & Plan: Visit Diagnoses:    ICD-10-CM   1. Pain in left hip  M25.552 XR C-ARM NO REPORT      Plan: No additional findings.   Meds & Orders: No orders of the defined types were placed in this encounter.   Orders Placed This Encounter  Procedures   Large Joint Inj   XR C-ARM NO REPORT    Follow-up: Return if symptoms worsen or fail to improve.   Procedures: Large Joint Inj: L hip joint on 04/10/2022 4:00 PM Indications: diagnostic evaluation and pain Details: 22 G 3.5 in needle, fluoroscopy-guided anterior approach  Arthrogram: No  Medications: 4 mL bupivacaine 0.25 %; 60 mg triamcinolone acetonide 40 MG/ML Outcome: tolerated well, no immediate complications  There was excellent flow of contrast producing a partial arthrogram of the hip. The patient did have relief of symptoms during the anesthetic phase of the injection. Procedure, treatment alternatives, risks and  benefits explained, specific risks discussed. Consent was given by the patient. Immediately prior to procedure a time out was called to verify the correct patient, procedure, equipment, support staff and site/side marked as required. Patient was prepped and draped in the usual sterile fashion.          Clinical History: No specialty comments available.     Objective:  VS:  HT:    WT:   BMI:     BP:   HR: bpm  TEMP: ( )  RESP:  Physical Exam   Imaging: No results found.

## 2022-04-25 ENCOUNTER — Other Ambulatory Visit: Payer: Self-pay | Admitting: Internal Medicine

## 2022-04-26 DIAGNOSIS — C61 Malignant neoplasm of prostate: Secondary | ICD-10-CM | POA: Diagnosis not present

## 2022-05-02 DIAGNOSIS — C61 Malignant neoplasm of prostate: Secondary | ICD-10-CM | POA: Diagnosis not present

## 2022-05-03 DIAGNOSIS — R972 Elevated prostate specific antigen [PSA]: Secondary | ICD-10-CM | POA: Diagnosis not present

## 2022-05-03 DIAGNOSIS — Z192 Hormone resistant malignancy status: Secondary | ICD-10-CM | POA: Diagnosis not present

## 2022-05-03 DIAGNOSIS — C61 Malignant neoplasm of prostate: Secondary | ICD-10-CM | POA: Diagnosis not present

## 2022-05-03 DIAGNOSIS — Z923 Personal history of irradiation: Secondary | ICD-10-CM | POA: Diagnosis not present

## 2022-05-03 DIAGNOSIS — Z79899 Other long term (current) drug therapy: Secondary | ICD-10-CM | POA: Diagnosis not present

## 2022-05-03 DIAGNOSIS — G893 Neoplasm related pain (acute) (chronic): Secondary | ICD-10-CM | POA: Diagnosis not present

## 2022-05-03 DIAGNOSIS — C7951 Secondary malignant neoplasm of bone: Secondary | ICD-10-CM | POA: Diagnosis not present

## 2022-05-03 DIAGNOSIS — R110 Nausea: Secondary | ICD-10-CM | POA: Diagnosis not present

## 2022-05-11 ENCOUNTER — Encounter: Payer: Self-pay | Admitting: Internal Medicine

## 2022-05-11 ENCOUNTER — Ambulatory Visit: Payer: Medicare Other | Attending: Internal Medicine | Admitting: Internal Medicine

## 2022-05-11 VITALS — BP 114/60 | HR 78 | Ht 76.0 in | Wt 227.0 lb

## 2022-05-11 DIAGNOSIS — Z79899 Other long term (current) drug therapy: Secondary | ICD-10-CM | POA: Diagnosis not present

## 2022-05-11 DIAGNOSIS — I428 Other cardiomyopathies: Secondary | ICD-10-CM

## 2022-05-11 DIAGNOSIS — I493 Ventricular premature depolarization: Secondary | ICD-10-CM | POA: Diagnosis not present

## 2022-05-11 DIAGNOSIS — Z5181 Encounter for therapeutic drug level monitoring: Secondary | ICD-10-CM | POA: Diagnosis not present

## 2022-05-11 DIAGNOSIS — I452 Bifascicular block: Secondary | ICD-10-CM | POA: Diagnosis not present

## 2022-05-11 NOTE — Patient Instructions (Signed)
Medication Instructions:  NO CHANGES  *If you need a refill on your cardiac medications before your next appointment, please call your pharmacy*   Follow-Up: At [facility], you and your health needs are our priority.  As part of our continuing mission to provide you with exceptional heart care, we have created designated Provider Care Teams.  These Care Teams include your primary Cardiologist (physician) and Advanced Practice Providers (APPs -  Physician Assistants and Nurse Practitioners) who all work together to provide you with the care you need, when you need it.  We recommend signing up for the patient portal called "MyChart".  Sign up information is provided on this After Visit Summary.  MyChart is used to connect with patients for Virtual Visits (Telemedicine).  Patients are able to view lab/test results, encounter notes, upcoming appointments, etc.  Non-urgent messages can be sent to your provider as well.   To learn more about what you can do with MyChart, go to [email or web address].    Your next appointment:   6 month(s)  The format for your next appointment:   In Person  Provider:   Kenneth C Hilty, MD   

## 2022-05-11 NOTE — Progress Notes (Signed)
OFFICE NOTE  Chief Complaint:  Follow-up   Primary Care Physician: Alroy Dust, L.Marlou Sa, MD  HPI:  [name] is a 87 y.o. male who is a former patient of Dr. Mare Ferrari. His wife is also a patient of mine. He has a history of PVCs and is on medicine for suppression of those PVCs and also possible hypertension. Blood pressure initially 155/83 became down to 120/78. He was noted to have a few PVCs today but said he did not take his medicine this morning. Other than that he denies any medical problems. He has a history of multiple orthopedic surgeries and gallbladder surgery in the past. He has a chronic left-sided chest pain which is sharp and he can point to with his finger. This is along the left sternal border and generally does not move, worsened with exertion or is not relieved by rest. He's had 2 heart catheterizations last 5 years, neither study indicated any significant obstructive coronary disease.  11/23/2016  [name] returns today for follow-up. He denies any chest pain or worsening shortness of breath. He does have some PVCs however he is unaware of them. He is on once daily metoprolol tartrate. Blood pressure is at goal today 122/70.  05/31/2017  [name] was seen today in follow-up. He reports 2 episodes of recent chest pain. One episode was associated with stomach upset and pain that radiated up the center of the chest to his neck. He had some associated nausea and diaphoresis. He had a second episode which might of occurred after a spicy meal that was similar although he ended up vomiting after which she felt better. He denied any exercise intolerance, is able to exercise regularly, recently went on a long walking trip vacation and has no symptoms with this activity. He has a history of a false positive stress test in the past to prior cardiac catheterizations in the last 5 years which were negative.  11/14/2017  [name] comes back today for follow-up.  He is without  complaints currently.  He was having atypical chest pain symptoms which I felt more likely GI in etiology.  He ultimately was found to have bile duct stones, although had a history of prior cholecystectomy.  The stones were extracted and he has since improved significantly.  He is in fact off of his PPI medication.  Blood pressures been well controlled.  He is active and denies any chest pain or worsening shortness of breath.  05/20/2018  [name] seen today in follow-up with his wife.  He continues to be without complaints.  Since he had a bile duct stones removed he is had no further chest discomfort.  He denies any shortness of breath or chest pain.  Blood pressure is at goal.  08/16/2018  [name] returns today for follow-up.  He has had some persistent palpitations.  EKG today shows PVCs in a bigeminal pattern.  Blood pressure is well controlled on metoprolol.  He also reports some shortness of breath, making me concerned a little about of whether he has developed any cardiomyopathy related to his PVCs.  The exact burden is not clear.  11/04/2018  [name] returns today for follow-up of his PVCs.  I referred him to Dr. Curt Bears after worsening cardiomyopathy was found to have an EF around 30 to 35%.  He was found also to have a PVC burden of around 15%.  After seeing Dr. Curt Bears, he was placed on flecainide and does feel like he has  had a reduction in his PVCs.  Heart rate is in the 50s today.  Blood pressure is normal.  He is also complaining of nosebleeds.  Also he had had some superficial bleeding of the scrotum, for which she said may have been related to itching and is noted to have conjunctival hemorrhage today which is likely spontaneous.  07/11/2019  [name] returns today for follow-up.  Fortunately has had improvement in his LVEF now up to 50 to 55% with grade 2 diastolic dysfunction and some atrial enlargement by echo in September.  Symptomatically he feels much better.  In  fact he says he is back to normal now.  He maintains on flecainide for PVCs.  EKG today shows sinus bradycardia with first-degree AV block, RBBB at 52.  QTc is 465 ms.  He denies any recurrent bleeding.  He would like a refill of his albuterol.  01/16/2020  [name] is seen today in follow-up.  He recently saw Dr. Curt Bears for further evaluation of his PVCs.  They seem to be well suppressed on flecainide.  His EF has improved.  QTC today is 445 ms.  He has a right bundle branch block and first-degree AV block.  He is asymptomatic denying chest pain or worsening shortness of breath.  Blood pressure is at goal.  07/14/2020  [name] returns today for follow-up.  Recently saw Dr. Curt Bears again and seems to have had good suppression over his PVCs.  EKG has been stable with no evidence of PVCs today and a right bundle branch block at rate of 55.  Blood pressures well controlled.  He is asymptomatic.  04/20/2021  [name] is seen today in follow-up.  Unfortunately was diagnosed with metastatic prostate cancer back in April.  He has been undergoing radiation and chemotherapy for this.  He says its been fairly rough on him.  Mostly with issues on urination.  He has had no significant issues with PVCs.  He has been seen by Dr. Curt Bears and remains on flecainide.  He denies any chest pain.  No heart failure symptoms.  11/04/2021  [name] returns today with his wife for follow-up.  He continues to battle metastatic prostate cancer.  He struggling with back pain.  He might get radiation for that.  He reports his PVCs have been pretty well suppressed and due to medication interactions his flecainide had to be reduced by Dr. Curt Bears.  Despite that he is done well.  No new or worsening heart failure symptoms.  01/09/2022  [name] returns today for follow-up of his shortness of breath.  He noticed that more recently which I felt was probably due to cancer and/or treatments for that.  I did go ahead and  get another echo which shows stable LV function at 50 to 55%.  No evidence for any heart failure.  Rhythm is sinus without PVC's on flecainide.  He has a follow-up with Dr. Curt Bears tomorrow.  QTc today was 4 and 75 ms.  05/11/2022  [name] is seen today in follow-up.  He is doing well with regards to PVCs.  The flecainide seems to be working well for him.  EKG today showed normal sinus rhythm with a first-degree AV block.  He had a repeat echo in May which showed stable LVEF at 50 to 55%.  He has no signs or symptoms of heart failure.  Unfortunately he has had worsening metastatic prostate cancer.  He is on a specific targeted therapy at this point  however he notes that his PSA is rising and he has quite a bit of pain from bony metastases.  This is an unfortunate situation.  It does sound like his options are somewhat limited.  PMHx:  Past Medical History:  Diagnosis Date   Arthritis    Asthma    only if develops cold   Dysrhythmia    h/o pvc's   GERD (gastroesophageal reflux disease)    Kidney stone on left side    Prostate cancer (HCC)    PVC (premature ventricular contraction)    Recurrent upper respiratory infection (URI) 03/2011    Past Surgical History:  Procedure Laterality Date   APPENDECTOMY     BACK SURGERY     x 4   CARDIAC CATHETERIZATION     july 2010-minor irregl   CERVICAL SPINE SURGERY     x 2   CHOLECYSTECTOMY     2008   ERCP N/A 07/24/2017   Procedure: ENDOSCOPIC RETROGRADE CHOLANGIOPANCREATOGRAPHY (ERCP);  Surgeon: Clarene Essex, MD;  Location: [facility];  Service: Endoscopy;  Laterality: N/A;   EYE SURGERY     bilat cataract   INCISION AND DRAINAGE HIP  07/18/2011   Procedure: IRRIGATION AND DEBRIDEMENT HIP;  Surgeon: Garald Balding, MD;  Location: [facility];  Service: Orthopedics;  Laterality: Right;  RIGHT HIP EXPLORATION, EXCISION OF DEEP BURSAL [place] STONE SURGERY  2011   R   KNEE ARTHROSCOPY     Left   PROSTATE BIOPSY     SHOULDER ARTHROSCOPY      Left   TOTAL HIP ARTHROPLASTY     Right   TOTAL HIP REVISION     Right   TOTAL KNEE ARTHROPLASTY Right 03/31/2014   Procedure: TOTAL KNEE ARTHROPLASTY;  Surgeon: Garald Balding, MD;  Location: [facility];  Service: Orthopedics;  Laterality: Right;   TOTAL KNEE ARTHROPLASTY Left 08/01/2016   Procedure: TOTAL KNEE ARTHROPLASTY;  Surgeon: Garald Balding, MD;  Location: [facility];  Service: Orthopedics;  Laterality: Left;   WRIST SURGERY     bilateral, plates    FAMHx:  Family History  Problem Relation Age of Onset   Heart disease Mother    Heart attack Mother    Suicidality Mother    Throat cancer Brother    Throat cancer Paternal Aunt    Anesthesia problems Neg Hx    Hypotension Neg Hx    Malignant hyperthermia Neg Hx    Pseudochol deficiency Neg Hx    Breast cancer Neg Hx    Prostate cancer Neg Hx    Colon cancer Neg Hx    Pancreatic cancer Neg Hx     SOCHx:   reports that he has never smoked. He has never used smokeless tobacco. He reports that he does not drink alcohol and does not use drugs.  ALLERGIES:  Allergies  Allergen Reactions   Erythromycin Base Anaphylaxis   Sulfur Anaphylaxis   Tetracycline Anaphylaxis   Tape Other (See Comments)    Adhesive tape blisters   Tetracycline Hcl     Other reaction(s): throat swelling:tongue to break out   Ciprofloxacin Other (See Comments)    "IT BROKE MY TONGUE OUT AND CAUSE SORES IN MY THROAT"   Erythromycin Rash   Sulfa Antibiotics Rash   Tetracyclines & Related Rash    ROS: Pertinent items noted in HPI and remainder of comprehensive ROS otherwise negative.  HOME MEDS: Current Outpatient Medications  Medication Sig Dispense Refill   albuterol (VENTOLIN HFA)  108 (90 Base) MCG/ACT inhaler Inhale 2 puffs into the lungs every 6 (six) hours as needed for wheezing or shortness of breath. Future refills from PCP. 1 g 0   Ascorbic Acid (VITAMIN C) 500 MG tablet Take 1 tablet (500 mg total) by mouth daily. 30 tablet     CALCIUM PO Take 2 capsules by mouth daily.     clotrimazole (LOTRIMIN) 1 % cream Apply topically.     degarelix (FIRMAGON) 80 MG injection Inject 80 mg into the skin every 28 (twenty-eight) days.     diclofenac sodium (VOLTAREN) 1 % GEL Apply 4 g topically 3 (three) times daily as needed (Apply to large joint up to 3 times daily as needed). 3 Tube 3   flecainide (TAMBOCOR) 50 MG tablet Take 1 tablet (50 mg total) by mouth 2 (two) times daily. 180 tablet 2   gabapentin (NEURONTIN) 100 MG capsule Take 100 mg by mouth 3 (three) times daily.     ibuprofen (ADVIL) 200 MG tablet Take 200 mg by mouth every 8 (eight) hours as needed (pain).     losartan (COZAAR) 25 MG tablet TAKE ONE TABLET BY MOUTH ONCE DAILY 90 tablet 3   metoprolol succinate (TOPROL-XL) 25 MG 24 hr tablet TAKE 1 TABLET ONCE DAILY. 90 tablet 3   nystatin cream (MYCOSTATIN) Apply 1 application topically 2 (two) times daily.     oxyCODONE (OXY IR/ROXICODONE) 5 MG immediate release tablet Take 5 mg by mouth every 4 (four) hours as needed for severe pain.     potassium chloride SA (KLOR-CON M) 20 MEQ tablet TAKE 1 TABLET ONCE DAILY. 90 tablet 3   tamsulosin (FLOMAX) 0.4 MG CAPS capsule Take 1 capsule (0.4 mg total) by mouth 2 (two) times daily. 60 capsule 0   traMADol (ULTRAM) 50 MG tablet Take 50 mg by mouth every 6 (six) hours as needed (pain).     No current facility-administered medications for this visit.    LABS/IMAGING: No results found for this or any previous visit (from the past 48 hour(s)).  No results found.  WEIGHTS: Wt Readings from Last 3 Encounters:  05/11/22 227 lb (103 kg)  01/10/22 236 lb (107 kg)  01/09/22 234 lb 12.8 oz (106.5 kg)    VITALS: BP 114/60 (BP Location: Left Arm, Patient Position: Sitting, Cuff Size: Normal)   Pulse 78   Ht '6\' 4"'$  (1.93 m)   Wt 227 lb (103 kg)   SpO2 94%   BMI 27.63 kg/m   EXAM: General appearance: alert and no distress Neck: no carotid bruit and no JVD Lungs: clear to  auscultation bilaterally Heart: regular rate and rhythm, S1, S2 normal, no murmur, click, rub or gallop Abdomen: soft, non-tender; bowel sounds normal; no masses,  no organomegaly Extremities: extremities normal, atraumatic, no cyanosis or edema Pulses: 2+ and symmetric Skin: Skin color, texture, turgor normal. No rashes or lesions Neurologic: Grossly normal Psych: Pleasant  EKG: Sinus rhythm first-degree AV block at 78, RBBB, QTc 485 ms-personally reviewed  ASSESSMENT: Symptomatic bigeminal PVCs - suppressed on flecainide Acute systolic heart failure-suspect PVC mediated, LVEF improved to 50-55% (05/2019) -> stable at 50-55% (01/2022) Shortness of breath RBBB Hypertension Mets prostate cancer  PLAN: 1.   Mr. Hoogland seems to be doing well without any significant palpitations or PVCs.  No heart failure symptoms.  EF has been stable at 50 to 55%.  Unfortunately his prostate cancer is worsened with a rising PSA.  He is now on very specialized therapy however  options are very limited.  Follow-up 6 months.  Pixie Casino, MD, [facility], [place] Director of the [facility] Diplomate of the American Board of Clinical Lipidology Attending Cardiologist  Direct Dial: [phone]  Fax: [phone]  Website:  [email or web address] [email or web address] 05/11/2022, 11:39 AM

## 2022-05-18 IMAGING — PT NM PET TUM IMG SKULL BASE T - THIGH
1 of 7 series · 1 of 25 positions shown · non-contrast
Comparison: CT chest from [HOSPITAL] 01/14/2021. CT urogram
from [HOSPITAL] 05/26/2021. Bone scan of 01/04/2021

CLINICAL DATA: Prostate cancer. Radiation therapy completed
05/22/2021. PSA of

EXAM:
NUCLEAR MEDICINE PET SKULL BASE TO THIGH
TECHNIQUE: 9.0 mCi F18 Piflufolastat (Pylarify) was injected intravenously.
Full-ring PET imaging was performed from the skull base to thigh
after the radiotracer. CT data was obtained and used for attenuation
correction and anatomic localization.

[Series 4: ct sk_thigh 5.0 bf37 · axial · 5.0mm · 0.98mm/px · 1 of 264 slices shown]
[im 198/264  brain]
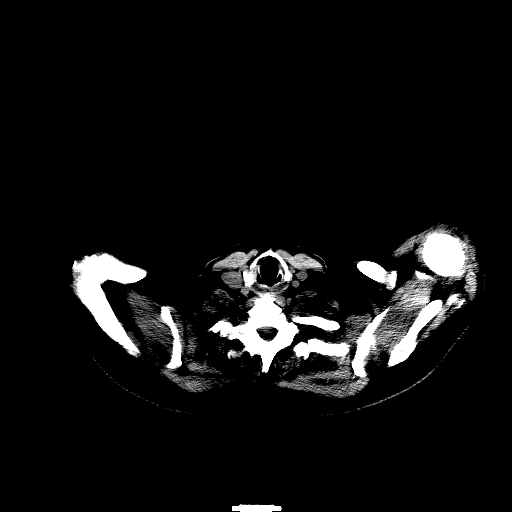

[1 of 25 positions shown; findings below may reference images not displayed]

FINDINGS: NECK

No radiotracer activity in neck lymph nodes.

Incidental CT finding: No cervical adenopathy.

CHEST

No radiotracer accumulation within mediastinal or hilar lymph nodes.

Incidental CT finding: Aortic atherosclerosis. Mild cardiomegaly.
Left main and 3 vessel coronary artery calcification.

ABDOMEN/PELVIS

Prostate: Multifocal prostatic tracer affinity. At the base, this is
positioned eccentric right at a S.U.V. max of 10.7. In the mid to
apical gland, this is bilateral and paramidline at a S.U.V. max of
15.6.

Lymph nodes: No tracer avid abdominal nodes. A left external iliac 4
mm node demonstrates mild tracer uptake at a S.U.V. max of 1.8 on
201/4.

Liver: No evidence of liver metastasis

Incidental CT finding: Deferred to recent diagnostic CT.
Pneumobilia. Normal adrenal glands. Punctate renal collecting system
calculi. Bladder calculi. Fat containing left inguinal hernia.
Bilateral renal low-density lesions which are likely cysts. An
interpolar right renal complex 8 mm lesion on 163/4.

SKELETON

Widespread tracer avid osseous metastasis. Example within the
posterior right iliac at a S.U.V. max of 13.3 on 187/4.

Within the anterior L1 vertebral body at a S.U.V. max of 11.4.
IMPRESSION: 1. Widespread, relatively diffuse osseous metastasis.
2. Multifocal prostatic tracer uptake, favoring residual viable
primary.
3. Isolated left external iliac tiny node with low-level tracer
uptake, suspicious for nodal metastasis.
4. Incidental findings, including: Bladder and renal calculi.
Complex 8 mm interpolar right renal lesion, indeterminate. Coronary
artery atherosclerosis. Aortic Atherosclerosis ([id number]).

## 2022-05-22 DIAGNOSIS — D4959 Neoplasm of unspecified behavior of other genitourinary organ: Secondary | ICD-10-CM | POA: Diagnosis not present

## 2022-05-24 DIAGNOSIS — C61 Malignant neoplasm of prostate: Secondary | ICD-10-CM | POA: Diagnosis not present

## 2022-05-26 DIAGNOSIS — Z23 Encounter for immunization: Secondary | ICD-10-CM | POA: Diagnosis not present

## 2022-05-26 DIAGNOSIS — B028 Zoster with other complications: Secondary | ICD-10-CM | POA: Diagnosis not present

## 2022-06-01 DIAGNOSIS — C7951 Secondary malignant neoplasm of bone: Secondary | ICD-10-CM | POA: Diagnosis not present

## 2022-06-01 DIAGNOSIS — C61 Malignant neoplasm of prostate: Secondary | ICD-10-CM | POA: Diagnosis not present

## 2022-06-05 DIAGNOSIS — C61 Malignant neoplasm of prostate: Secondary | ICD-10-CM | POA: Diagnosis not present

## 2022-06-05 DIAGNOSIS — Z7952 Long term (current) use of systemic steroids: Secondary | ICD-10-CM | POA: Diagnosis not present

## 2022-06-05 DIAGNOSIS — Z79899 Other long term (current) drug therapy: Secondary | ICD-10-CM | POA: Diagnosis not present

## 2022-06-05 DIAGNOSIS — Z79818 Long term (current) use of other agents affecting estrogen receptors and estrogen levels: Secondary | ICD-10-CM | POA: Diagnosis not present

## 2022-06-05 DIAGNOSIS — R110 Nausea: Secondary | ICD-10-CM | POA: Diagnosis not present

## 2022-06-05 DIAGNOSIS — C7951 Secondary malignant neoplasm of bone: Secondary | ICD-10-CM | POA: Diagnosis not present

## 2022-06-05 DIAGNOSIS — Z192 Hormone resistant malignancy status: Secondary | ICD-10-CM | POA: Diagnosis not present

## 2022-06-06 ENCOUNTER — Other Ambulatory Visit: Payer: Self-pay | Admitting: Cardiology

## 2022-06-08 DIAGNOSIS — C7951 Secondary malignant neoplasm of bone: Secondary | ICD-10-CM | POA: Diagnosis not present

## 2022-06-08 DIAGNOSIS — D4959 Neoplasm of unspecified behavior of other genitourinary organ: Secondary | ICD-10-CM | POA: Diagnosis not present

## 2022-06-13 ENCOUNTER — Telehealth: Payer: Self-pay

## 2022-06-13 NOTE — Telephone Encounter (Signed)
Spoke with patient and scheduled a telephonic  Palliative Consult for 06/26/22 @ 2 PM.  Consent obtained; updated Netsmart, Team List and Epic.

## 2022-06-15 DIAGNOSIS — Z192 Hormone resistant malignancy status: Secondary | ICD-10-CM | POA: Diagnosis not present

## 2022-06-15 DIAGNOSIS — C61 Malignant neoplasm of prostate: Secondary | ICD-10-CM | POA: Diagnosis not present

## 2022-06-26 ENCOUNTER — Ambulatory Visit: Payer: Medicare Other | Admitting: Internal Medicine

## 2022-06-26 DIAGNOSIS — T402X5A Adverse effect of other opioids, initial encounter: Secondary | ICD-10-CM | POA: Diagnosis not present

## 2022-06-26 DIAGNOSIS — M898X9 Other specified disorders of bone, unspecified site: Secondary | ICD-10-CM | POA: Diagnosis not present

## 2022-06-26 DIAGNOSIS — R110 Nausea: Secondary | ICD-10-CM

## 2022-06-26 DIAGNOSIS — G893 Neoplasm related pain (acute) (chronic): Secondary | ICD-10-CM | POA: Diagnosis not present

## 2022-06-26 DIAGNOSIS — C7951 Secondary malignant neoplasm of bone: Secondary | ICD-10-CM | POA: Diagnosis not present

## 2022-06-26 DIAGNOSIS — K5903 Drug induced constipation: Secondary | ICD-10-CM

## 2022-06-26 DIAGNOSIS — Z515 Encounter for palliative care: Secondary | ICD-10-CM

## 2022-06-26 DIAGNOSIS — C61 Malignant neoplasm of prostate: Secondary | ICD-10-CM | POA: Diagnosis not present

## 2022-06-26 DIAGNOSIS — R64 Cachexia: Secondary | ICD-10-CM

## 2022-06-26 MED ORDER — DEXAMETHASONE 4 MG PO TABS: 4.0000 mg | ORAL_TABLET | Freq: Every morning | ORAL | 3 refills | Status: DC

## 2022-06-26 MED ORDER — HYDROMORPHONE HCL 2 MG PO TABS: 2.0000 mg | ORAL_TABLET | Freq: Four times a day (QID) | ORAL | 0 refills | Status: DC | PRN

## 2022-06-26 NOTE — Progress Notes (Signed)
[facility] Consult Note Telephone: [phone]  Fax: [phone]   Date of encounter: 06/26/22 9:25 AM PATIENT NAME: [name] [place] [address]   [phone] (home)  DOB: 1935/01/29 MRN: [id number] PRIMARY CARE PROVIDER:    Alroy Christian, L.[name],  [place] [address] [employer] [address] [place] [address] [phone]  REFERRING PROVIDER:   Alroy Christian, L.George Sa, [place] [address] [employer] [address] [place],  [place] [address] [phone]  RESPONSIBLE PARTY:    Contact Information     Name Relation Home Work [name] Daughter [phone]  [phone]   [name] [phone]        Due to the COVID-19 crisis, this visit was done via telemedicine from my office and it was initiated and consent by this patient and or family.  I connected with  [name] OR PROXY on 06/26/22 by a telemedicine application and verified that I am speaking with the correct person using two identifiers.  This patient did not have the ability to connect via a video-enabled capacity.   I discussed the limitations of evaluation and management by telemedicine. The patient expressed understanding and agreed to proceed.  Palliative Care was asked to follow this patient by consultation request of  [name] to address advance care planning and complex medical decision making. This is the initial visit.                                     ASSESSMENT AND PLAN / RECOMMENDATIONS:   Advance Care Planning/Goals of Care: Goals include to maximize quality of life and symptom management. Patient/health care surrogate gave his/her permission to discuss.Our advance care planning conversation included a discussion about:    The value and importance of advance care planning  Experiences with loved ones who have been seriously ill or have died  Exploration of personal, cultural  or spiritual beliefs that might influence medical decisions  Exploration of goals of care in the event of a sudden injury or illness  Identification  of a healthcare agent--wife [name], daughters Review and updating or creation of an  advance directive document . Decision not to resuscitate or to de-escalate disease focused treatments due to poor prognosis. CODE STATUS: last noted as full code  Symptom Management/Plan: 1. Malignant bone pain -has gotten considerably worse off decadron--recommended restarting -would also benefit from adding pepcid or even PPI for gastro protection with this  =oxycodone 85m not helping and pt had hallucinations with morphine so does not want to take it again, is afraid to use fentanyl patch -agrees to try dilaudid which he has take before for something and tolerated (despite sulfa allergy that comes up when ordered) - HYDROmorphone (DILAUDID) 2 MG tablet; Take 1 tablet (2 mg total) by mouth every 6 (six) hours as needed for severe pain.  Dispense: 56 tablet; Refill: 0 - dexamethasone (DECADRON) 4 MG tablet; Take 1 tablet (4 mg total) by mouth every morning.  Dispense: 30 tablet; Refill: 3  2. Malignant neoplasm of prostate metastatic to bone ([facility] -now involving most of his bones of axial and appendicular skeleton -has had numerous txs including radiation more than once -pain regimen adjusted as above -if not effective, would add the fentanyl patch 188m that was already ordered--explained different ways meds work for pain -also discussed importance of bowel regimen which has actually  been a bit too effective so far  3. Nausea -discussed meds that are already ordered that may help this -does not note benefit from olanzapine so far outside of sleepiness -may use reglan, be sure to take meds with food  4. Constipation due to opioid therapy -continue miralax, normally would add senokot-s but pt already having more frequent bms than desired so hold off  5.  Cachexia ([place]) -not eating well, pants loose per wife, continue milkshakes, frequent snacks as tolerates  6. Palliative care encounter -provided education about palliative care, explained benefits of hospice care -currently, per pt's wife, he wants to remain on palliative care and continue injetions through oncology--he did not disagree with this statement -will see him next week to see how changes have benefitted him or if tweaks needed -also will try to get him a bedside commode but harder when this was not F2F visit  Follow up Palliative Care Visit: Palliative care will continue to follow for complex medical decision making, advance care planning, and clarification of goals. Return 07/28/2022    This visit was coded based on medical decision making (MDM).  PPS: 50%  HOSPICE ELIGIBILITY/DIAGNOSIS: yes when off cancer txs/metastatic prostate ca to bones  Chief Complaint: initial palliative care consult televisit  HISTORY OF PRESENT ILLNESS:  [name] is a 87 y.o. 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  He was initially diagnosed back in Spring of 2022 when gleason score 5+4 and PSA 16.44 in May 2022--he is followed at [facility] for this.  He received numerous treatments since then including dagrelix 01/07/21, lupron 9/22, radiotherapy July to Sept 2022 (40), ADT plus abiraterone started 11/22, chemo 12/22, palliative XRT (10) and then pluvicto therapy. A phone note indicates that he's been having pain in his sternum and rib cage.  He says it's spread to almost all of his body.  He's had terrible pain for the past few days.  He's been taking oxycodone but it's not helped much the last day or two.  59m every 4 hours.  It might last 2 hours and then it starts up again.  He's also taking miralax that's working too well.  He's had one xofigo treatment.  He says it's hard to tell how much good the treatment did--hard to know if his discomfort  is from the cancer or the tx.  Dr. MJess Barterssent [place]  He has not tried the fentanyl patch yet.  He's had hallucinations with morphine.  Already on gabapentin 10107mpo tid.  His two daughters are there with him and MaStanton Kidneyhis wife.   He's been doing well with bladder control.  Sleep:  doesn't sleep very well--gets 4-5 hrs per night.  He's on olanzapine 1082mor that.  He has a palpable tumor on his left chest.  His abdomen is getting distended per his wife, MarStanton KidneyHe was doing better when on the steroid--he's done better.  He has dry mouth.   He cannot get comfortable on his back or his side in the bed.  Winds up in the recliner. Pain is atrocious.  Appetite is poor--everything tastes bitter.  He'll chew and chew and food won't go down.  He's been drinking milkshakes and fluids.  He eats better and feels better on the steroid--decadron.  His daughter called to get that renewed.  His pants are falling off b/c he's losing weight.  06/08/22 albumin 3.7, PSA 18.68, cr 1.03,  alk phos 384  He is anxious and nausea is considerable.  Not helped by olanzapine and it makes him very drowsy.  Not using ativan but has it.    History obtained from review of EMR, discussion with primary team, and interview with family, facility staff/caregiver and/or [name].   I reviewed available labs, medications, imaging, studies and related documents from the EMR.  Records reviewed and summarized above.   ROS Review of Systemssee hpi  Weight 223.7 lbs on 06/15/22  Wt Readings from Last 500 Encounters:  05/11/22 227 lb (103 kg)  01/10/22 236 lb (107 kg)  01/09/22 234 lb 12.8 oz (106.5 kg)  11/04/21 234 lb 12.8 oz (106.5 kg)  09/23/21 231 lb 12.8 oz (105.1 kg)  08/23/21 233 lb 9.6 oz (106 kg)  04/19/21 237 lb (107.5 kg)  02/01/21 235 lb 3.2 oz (106.7 kg)  01/19/21 236 lb (107 kg)  01/11/21 235 lb (106.6 kg)  07/14/20 237 lb 9.6 oz (107.8 kg)  06/22/20 238 lb 9.6 oz (108.2 kg)  01/16/20 236  lb 9.6 oz (107.3 kg)  12/18/19 239 lb (108.4 kg)  07/11/19 235 lb 12.8 oz (107 kg)  04/15/19 234 lb 6.4 oz (106.3 kg)  03/13/19 235 lb (106.6 kg)  11/04/18 239 lb 3.2 oz (108.5 kg)  09/24/18 237 lb (107.5 kg)  08/16/18 237 lb (107.5 kg)  05/20/18 235 lb 12.8 oz (107 kg)  12/27/17 234 lb (106.1 kg)  11/14/17 239 lb (108.4 kg)  07/24/17 230 lb (104.3 kg)  06/12/17 234 lb (106.1 kg)  05/31/17 234 lb 12.8 oz (106.5 kg)  12/21/16 255 lb (115.7 kg)  11/23/16 236 lb 12.8 oz (107.4 kg)  08/28/16 230 lb (104.3 kg)  08/21/16 235 lb (106.6 kg)  08/14/16 235 lb (106.6 kg)  08/01/16 235 lb (106.6 kg)  07/20/16 237 lb 12.8 oz (107.9 kg)  07/19/16 239 lb (108.4 kg)  04/12/16 236 lb 9.6 oz (107.3 kg)  10/15/15 234 lb 1.9 oz (106.2 kg)  01/27/15 240 lb (108.9 kg)  06/29/14 228 lb (103.4 kg)  03/31/14 230 lb (104.3 kg)  12/29/13 228 lb (103.4 kg)  08/11/13 229 lb (103.9 kg)  04/11/13 227 lb 12.8 oz (103.3 kg)  12/12/12 228 lb 12.8 oz (103.8 kg)  08/14/12 230 lb 3.2 oz (104.4 kg)  04/16/12 227 lb (103 kg)  12/07/11 225 lb (102.1 kg)  08/09/11 228 lb (103.4 kg)  07/17/11 229 lb 8 oz (104.1 kg)  07/12/11 229 lb 8 oz (104.1 kg)  04/21/11 234 lb (106.1 kg)  12/13/10 (!) 234 lb (106.1 kg)    CURRENT PROBLEM LIST:  Patient Active Problem List   Diagnosis Date Noted   Unilateral primary osteoarthritis, left hip 09/06/2021   Malignant neoplasm of prostate metastatic to bone ([place]) 06/29/2021   Malignant neoplasm of prostate ([place]) 01/19/2021   Acute pain of left knee 03/13/2019   Nonischemic cardiomyopathy ([place]) 11/04/2018   Dyspnea 11/04/2018   Ventricular bigeminy 08/16/2018   Gastroesophageal reflux disease 05/31/2017   S/P total knee replacement using cement, left 08/01/2016   RBBB (right bundle branch block with left anterior fascicular block) 04/12/2016   PVC's (premature ventricular contractions) 04/12/2016   Chest wall pain 04/12/2016   Bronchial asthma 06/29/2014   Urinary  retention due to benign prostatic hyperplasia 04/03/2014   Ileus, postoperative ([place]) 04/03/2014   Osteoarthritis of right knee 04/01/2014   S/P total knee replacement using cement 03/31/2014   Skin rash 04/11/2013   Pseudogout of knee  12/12/2012   Dyspepsia 08/14/2012   Trochanteric Bursitis of right hip 07/18/2011   Benign hypertensive heart disease without heart failure 12/13/2010   BPH (benign prostatic hyperplasia) 12/13/2010   Unilateral primary osteoarthritis, left knee 12/13/2010   Dyslipidemia 12/13/2010   Status post cholecystectomy 12/13/2010   PAST MEDICAL HISTORY:  Active Ambulatory Problems    Diagnosis Date Noted   Benign hypertensive heart disease without heart failure 12/13/2010   BPH (benign prostatic hyperplasia) 12/13/2010   Unilateral primary osteoarthritis, left knee 12/13/2010   Dyslipidemia 12/13/2010   Status post cholecystectomy 12/13/2010   Trochanteric Bursitis of right hip 07/18/2011   Dyspepsia 08/14/2012   Pseudogout of knee 12/12/2012   Skin rash 04/11/2013   S/P total knee replacement using cement 03/31/2014   Osteoarthritis of right knee 04/01/2014   Urinary retention due to benign prostatic hyperplasia 04/03/2014   Ileus, postoperative ([place]) 04/03/2014   Bronchial asthma 06/29/2014   RBBB (right bundle branch block with left anterior fascicular block) 04/12/2016   PVC's (premature ventricular contractions) 04/12/2016   Chest wall pain 04/12/2016   S/P total knee replacement using cement, left 08/01/2016   Gastroesophageal reflux disease 05/31/2017   Ventricular bigeminy 08/16/2018   Nonischemic cardiomyopathy ([place]) 11/04/2018   Dyspnea 11/04/2018   Acute pain of left knee 03/13/2019   Malignant neoplasm of prostate ([place]) 01/19/2021   Malignant neoplasm of prostate metastatic to bone ([place]) 06/29/2021   Unilateral primary osteoarthritis, left hip 09/06/2021   Resolved Ambulatory Problems    Diagnosis Date Noted   No Resolved Ambulatory  Problems   Past Medical History:  Diagnosis Date   Arthritis    Asthma    Dysrhythmia    GERD (gastroesophageal reflux disease)    Christian stone on left side    Prostate cancer (HCC)    PVC (premature ventricular contraction)    Recurrent upper respiratory infection (URI) 03/2011   SOCIAL HX:  Social History   Tobacco Use   Smoking status: Never   Smokeless tobacco: Never  Substance Use Topics   Alcohol use: No    Alcohol/week: 0.0 standard drinks of alcohol   FAMILY HX:  Family History  Problem Relation Age of Onset   Heart disease Mother    Heart attack Mother    Suicidality Mother    Throat cancer Brother    Throat cancer Paternal Aunt    Anesthesia problems Neg Hx    Hypotension Neg Hx    Malignant hyperthermia Neg Hx    Pseudochol deficiency Neg Hx    Breast cancer Neg Hx    Prostate cancer Neg Hx    Colon cancer Neg Hx    Pancreatic cancer Neg Hx       ALLERGIES:  Allergies  Allergen Reactions   Erythromycin Base Anaphylaxis   Sulfur Anaphylaxis   Tetracycline Anaphylaxis   Tape Other (See Comments)    Adhesive tape blisters   Tetracycline Hcl     Other reaction(s): throat swelling:tongue to break out   Ciprofloxacin Other (See Comments)    "IT BROKE MY TONGUE OUT AND CAUSE SORES IN MY THROAT"   Erythromycin Rash   Sulfa Antibiotics Rash   Tetracyclines & Related Rash      PERTINENT MEDICATIONS:  Outpatient Encounter Medications as of 06/26/2022  Medication Sig   albuterol (VENTOLIN HFA) 108 (90 Base) MCG/ACT inhaler Inhale 2 puffs into the lungs every 6 (six) hours as needed for wheezing or shortness of breath. Future refills from PCP.   Ascorbic Acid (  VITAMIN C) 500 MG tablet Take 1 tablet (500 mg total) by mouth daily.   CALCIUM PO Take 2 capsules by mouth daily.   clotrimazole (LOTRIMIN) 1 % cream Apply topically.   degarelix (FIRMAGON) 80 MG injection Inject 80 mg into the skin every 28 (twenty-eight) days.   diclofenac sodium (VOLTAREN) 1 %  GEL Apply 4 g topically 3 (three) times daily as needed (Apply to large joint up to 3 times daily as needed).   flecainide (TAMBOCOR) 50 MG tablet Take 1 tablet (50 mg total) by mouth 2 (two) times daily.   gabapentin (NEURONTIN) 100 MG capsule Take 100 mg by mouth 3 (three) times daily.   ibuprofen (ADVIL) 200 MG tablet Take 200 mg by mouth every 8 (eight) hours as needed (pain).   losartan (COZAAR) 25 MG tablet TAKE ONE TABLET BY MOUTH ONCE DAILY   metoprolol succinate (TOPROL-XL) 25 MG 24 hr tablet TAKE 1 TABLET ONCE DAILY.   nystatin cream (MYCOSTATIN) Apply 1 application topically 2 (two) times daily.   oxyCODONE (OXY IR/ROXICODONE) 5 MG immediate release tablet Take 5 mg by mouth every 4 (four) hours as needed for severe pain.   potassium chloride SA (KLOR-CON George) 20 MEQ tablet TAKE 1 TABLET ONCE DAILY.   tamsulosin (FLOMAX) 0.4 MG CAPS capsule Take 1 capsule (0.4 mg total) by mouth 2 (two) times daily.   traMADol (ULTRAM) 50 MG tablet Take 50 mg by mouth every 6 (six) hours as needed (pain).   No facility-administered encounter medications on file as of 06/26/2022.    Thank you for the opportunity to participate in the care of [name].  The palliative care team will continue to follow. Please call our office at [phone] if we can be of additional assistance.   Hollace Kinnier, DO  COVID-19 PATIENT SCREENING TOOL Asked and negative response unless otherwise noted:  Have you had symptoms of covid, tested positive or been in contact with someone with symptoms/positive test in the past 5-10 days?  NO

## 2022-06-29 DIAGNOSIS — C61 Malignant neoplasm of prostate: Secondary | ICD-10-CM | POA: Diagnosis not present

## 2022-06-29 DIAGNOSIS — M8448XD Pathological fracture, other site, subsequent encounter for fracture with routine healing: Secondary | ICD-10-CM | POA: Diagnosis not present

## 2022-06-29 DIAGNOSIS — M899 Disorder of bone, unspecified: Secondary | ICD-10-CM | POA: Diagnosis not present

## 2022-06-29 DIAGNOSIS — R918 Other nonspecific abnormal finding of lung field: Secondary | ICD-10-CM | POA: Diagnosis not present

## 2022-06-29 DIAGNOSIS — M898X9 Other specified disorders of bone, unspecified site: Secondary | ICD-10-CM | POA: Diagnosis not present

## 2022-06-29 DIAGNOSIS — C7951 Secondary malignant neoplasm of bone: Secondary | ICD-10-CM | POA: Diagnosis not present

## 2022-07-03 ENCOUNTER — Other Ambulatory Visit: Payer: Medicare Other | Admitting: Internal Medicine

## 2022-07-03 VITALS — HR 120 | Temp 97.7°F

## 2022-07-03 DIAGNOSIS — T402X5A Adverse effect of other opioids, initial encounter: Secondary | ICD-10-CM | POA: Diagnosis not present

## 2022-07-03 DIAGNOSIS — R64 Cachexia: Secondary | ICD-10-CM

## 2022-07-03 DIAGNOSIS — Z515 Encounter for palliative care: Secondary | ICD-10-CM | POA: Diagnosis not present

## 2022-07-03 DIAGNOSIS — C61 Malignant neoplasm of prostate: Secondary | ICD-10-CM | POA: Diagnosis not present

## 2022-07-03 DIAGNOSIS — K5903 Drug induced constipation: Secondary | ICD-10-CM

## 2022-07-03 DIAGNOSIS — C7951 Secondary malignant neoplasm of bone: Secondary | ICD-10-CM | POA: Diagnosis not present

## 2022-07-03 DIAGNOSIS — M898X9 Other specified disorders of bone, unspecified site: Secondary | ICD-10-CM | POA: Diagnosis not present

## 2022-07-03 DIAGNOSIS — R197 Diarrhea, unspecified: Secondary | ICD-10-CM

## 2022-07-03 DIAGNOSIS — G893 Neoplasm related pain (acute) (chronic): Secondary | ICD-10-CM

## 2022-07-03 DIAGNOSIS — R410 Disorientation, unspecified: Secondary | ICD-10-CM | POA: Diagnosis not present

## 2022-07-03 NOTE — Progress Notes (Signed)
[facility] Visit Telephone: [phone]  Fax: [phone]   Date of encounter: 07/04/22 4:20 PM PATIENT NAME: [name] [address]   [phone] (home)  DOB: [birth year] MRN: [id number] PRIMARY CARE PROVIDER:    Alroy Dust, L.Marlou Sa, MD,  [place] [employer] [place] [address] [phone]  REFERRING PROVIDER:   Alroy Dust, L.Marlou Sa, [place] [employer] [place],  [place] [address] [phone]  RESPONSIBLE PARTY:    Contact Information     Name Relation Home Work [place] Daughter [phone]  [phone]   [place], [place]bo[place] [phone]        I met face to face with patient and family in his home along with his wife, Stanton Kidney, daughter, Jackelyn Poling, and son in Sports coach, [place].  Palliative Care was asked to follow this patient by consultation request of  Dr. Emeterio Reeve to address advance care planning and complex medical decision making. This is follow-up visit.                                     ASSESSMENT AND PLAN / RECOMMENDATIONS:   Advance Care Planning/Goals of Care: Goals include to maximize quality of life and symptom management. Patient/health care surrogate gave his/her permission to discuss.Our advance care planning conversation included a discussion about:    The value and importance of advance care planning  Experiences with loved ones who have been seriously ill or have died  Exploration of personal, cultural or spiritual beliefs that might influence medical decisions  Exploration of goals of care in the event of a sudden injury or illness  Identification  of a healthcare agent--wife, Stanton Kidney (but she does have multiple medical problems and some memory loss) Chimenti,DEBBIE (Daughter)  [phone] [facility]) and Larkin Ina [phone]), are contacts to schedule hospice evaluation Review and updating or creation of  an  advance directive document . Decision not to resuscitate or to de-escalate disease focused treatments due to poor prognosis. CODE STATUS:  DNR completed formally today and pt agreed with this.    Symptom Management/Plan: 1. Malignant neoplasm of prostate metastatic to bone (HCC) -pt with new CT scan indicating further spread to lungs and liver, as well, and he's developed progressive symptoms including hypoactive delirium (lethargy and saying he's going into another world), dyspnea on exertion, diarrhea (seems due to miralax though), eating only bites, now cannot even ambulate with his walker independently when he was using a cane 2-3 wks ago, less evidence of pain in view of considerable lethargy -discussed hospice due to his dramatic decline and need for improved symptom mgt with family and patient and all present are in agreement -call out to Dr. Janyth Contes office   2. Malignant bone pain -got significant relief at last with use of dilaudid along with fentanyl 88mg patch, but has not had any breakthrough pain meds in 2 days due to lethargy  3. Constipation due to opioid therapy -was having and getting miralax -suggested use of senna-s instead due to his diarrhea  4. Cachexia ([place] -last weight in chart from 10/26 was 220 lbs with BMI 27.78 -eating just bites but drinking several bottles of water daily and occasional soda  5. Delirium -hypoactive, may be pain-related, but suspect prognosis of days to weeks, could also have brain mets not identified -had been on olanzapine (was for his nausea but  family had not been using as it led to lethargy and he was wanting to stay alert)  6. Diarrhea, unspecified type -after a dose of miralax for constipation--up all last night and going hourly today, as well  Palliative care visit:  refer to hospice; 50 mins spent answering questions and providing education about hospice program, expectations Hospital bed and bedside commode  needed  Follow up Palliative Care Visit: Pt and family on board for hospice care awaiting Dr. Janyth Contes office call back or order.  Message left with his office.     This visit was coded based on medical decision making (MDM).  PPS: 30%  HOSPICE ELIGIBILITY/DIAGNOSIS: Metastatic prostate cancer to bone, liver and lung  Chief Complaint: Follow-up palliative visit  HISTORY OF PRESENT ILLNESS:  [name] is a 87 y.o. year old male  with with diffusely metastatic prostate cancer to the bone per PET 06/15/22.  He was initially diagnosed back in Spring of 2022 when gleason score 5+4 and PSA 16.44 in May 2022--he is followed at [facility] for this.  He received numerous treatments since then including dagrelix 01/07/21, lupron 9/22, radiotherapy July to Sept 2022 (40), ADT plus abiraterone started 11/22, chemo 12/22, palliative XRT (10) and then pluvicto therapy.  He has diffuse skeletal mets as well as hepatic and pulmonary mets from prostate cancer seen in f/u for symptom mgt at home. He remains on injections through oncology for prostate cancer treatments.    I had the initial visit with patient for palliative last week.  I recommended resuming regular decadron daily, dilaudid 14m q 6h prn instead of oxycodone for pain and using the fentanyl patches 170m/h that oncology had already ordered but he'd been hesitant to try.  Unfortunately, pain was still not well controlled apparently.  Since then, he felt a pop in his chest and his pain was markedly worse so he'd been to the ED.  New CT chest w/o contrast on 07/02/22 showed 1. No acute findings in the chest. 2. Widespread skeletal metastatic disease. A lesion in the sternum appears to breech the anterior cortical margin without an extraosseous soft tissue extent, but suggesting a subtle pathologic fracture. Skeletal metastatic disease is new when compared to the prior CT. 3. Confluent metastatic lesion along the right posterior aspects of T8  and T9 involve the neural foramina, right epidural space and adjacent ribs. Exiting nerve roots are likely affected. 4. Multiple small lung nodules, most of which are new, consistent with metastatic disease. 5. Numerous liver lesions, new, consistent with metastatic disease. Aortic Atherosclerosis (ICD10-I70.0).  See a/p above for updates.  History obtained from review of EMR, discussion with primary team, and interview with family, facility staff/caregiver and/or Mr. Eaddy.   I reviewed available labs, medications, imaging, studies and related documents from the EMR.  Records reviewed and summarized above.   ROS Review of Systems see hpi and a/p  Physical Exam: Vitals:   07/04/22 1556  Pulse: (!) 120  Temp: 97.7 F (36.5 C)  SpO2: 92%   There is no height or weight on file to calculate BMI. Wt Readings from Last 500 Encounters:  05/11/22 227 lb (103 kg)  01/10/22 236 lb (107 kg)  01/09/22 234 lb 12.8 oz (106.5 kg)  11/04/21 234 lb 12.8 oz (106.5 kg)  09/23/21 231 lb 12.8 oz (105.1 kg)  08/23/21 233 lb 9.6 oz (106 kg)  04/19/21 237 lb (107.5 kg)  02/01/21 235 lb 3.2 oz (106.7 kg)  01/19/21 236 lb (107  kg)  01/11/21 235 lb (106.6 kg)  07/14/20 237 lb 9.6 oz (107.8 kg)  06/22/20 238 lb 9.6 oz (108.2 kg)  01/16/20 236 lb 9.6 oz (107.3 kg)  12/18/19 239 lb (108.4 kg)  07/11/19 235 lb 12.8 oz (107 kg)  04/15/19 234 lb 6.4 oz (106.3 kg)  03/13/19 235 lb (106.6 kg)  11/04/18 239 lb 3.2 oz (108.5 kg)  09/24/18 237 lb (107.5 kg)  08/16/18 237 lb (107.5 kg)  05/20/18 235 lb 12.8 oz (107 kg)  12/27/17 234 lb (106.1 kg)  11/14/17 239 lb (108.4 kg)  07/24/17 230 lb (104.3 kg)  06/12/17 234 lb (106.1 kg)  05/31/17 234 lb 12.8 oz (106.5 kg)  12/21/16 255 lb (115.7 kg)  11/23/16 236 lb 12.8 oz (107.4 kg)  08/28/16 230 lb (104.3 kg)  08/21/16 235 lb (106.6 kg)  08/14/16 235 lb (106.6 kg)  08/01/16 235 lb (106.6 kg)  07/20/16 237 lb 12.8 oz (107.9 kg)  07/19/16 239 lb  (108.4 kg)  04/12/16 236 lb 9.6 oz (107.3 kg)  10/15/15 234 lb 1.9 oz (106.2 kg)  01/27/15 240 lb (108.9 kg)  06/29/14 228 lb (103.4 kg)  03/31/14 230 lb (104.3 kg)  12/29/13 228 lb (103.4 kg)  08/11/13 229 lb (103.9 kg)  04/11/13 227 lb 12.8 oz (103.3 kg)  12/12/12 228 lb 12.8 oz (103.8 kg)  08/14/12 230 lb 3.2 oz (104.4 kg)  04/16/12 227 lb (103 kg)  12/07/11 225 lb (102.1 kg)  08/09/11 228 lb (103.4 kg)  07/17/11 229 lb 8 oz (104.1 kg)  07/12/11 229 lb 8 oz (104.1 kg)  04/21/11 234 lb (106.1 kg)  12/13/10 (!) 234 lb (106.1 kg)   Physical Exam Constitutional:      Appearance: He is ill-appearing.     Comments: Lethargic, resting in recliner, will stay awake for only a few minutes and not make it all the way through a discussion; shows evidence of pain when sits up and tries to get up out of chair; Larkin Ina had to support him from behind and he nearly fell backwards while trying to walk with his walker to the restroom  HENT:     Head: Normocephalic and atraumatic.     Ears:     Comments: HOH Eyes:     Conjunctiva/sclera: Conjunctivae normal.     Pupils: Pupils are equal, round, and reactive to light.  Cardiovascular:     Rate and Rhythm: Regular rhythm. Tachycardia present.  Pulmonary:     Comments: Some splinting of breath (rib fxs); tachypneic when ambulates but sats wnl afterward Abdominal:     General: Bowel sounds are normal.     Palpations: Abdomen is soft.     Comments: Mild RUQ tenderness  Musculoskeletal:        General: Normal range of motion.     Right lower leg: Edema present.     Left lower leg: Edema present.  Skin:    General: Skin is warm and dry.     Coloration: Skin is pale.  Neurological:     Gait: Gait abnormal.     Comments: Difficulty focusing, falls asleep  Psychiatric:        Mood and Affect: Mood normal.     CURRENT PROBLEM LIST:  Patient Active Problem List   Diagnosis Date Noted   Unilateral primary osteoarthritis, left hip 09/06/2021    Malignant neoplasm of prostate metastatic to bone ([place]) 06/29/2021   Malignant neoplasm of prostate ([place]) 01/19/2021   Acute pain  of left knee 03/13/2019   Nonischemic cardiomyopathy ([place]) 11/04/2018   Dyspnea 11/04/2018   Ventricular bigeminy 08/16/2018   Gastroesophageal reflux disease 05/31/2017   S/P total knee replacement using cement, left 08/01/2016   RBBB (right bundle branch block with left anterior fascicular block) 04/12/2016   PVC's (premature ventricular contractions) 04/12/2016   Chest wall pain 04/12/2016   Bronchial asthma 06/29/2014   Urinary retention due to benign prostatic hyperplasia 04/03/2014   Ileus, postoperative ([place]) 04/03/2014   Osteoarthritis of right knee 04/01/2014   S/P total knee replacement using cement 03/31/2014   Skin rash 04/11/2013   Pseudogout of knee 12/12/2012   Dyspepsia 08/14/2012   Trochanteric Bursitis of right hip 07/18/2011   Benign hypertensive heart disease without heart failure 12/13/2010   BPH (benign prostatic hyperplasia) 12/13/2010   Unilateral primary osteoarthritis, left knee 12/13/2010   Dyslipidemia 12/13/2010   Status post cholecystectomy 12/13/2010    PAST MEDICAL HISTORY:  Active Ambulatory Problems    Diagnosis Date Noted   Benign hypertensive heart disease without heart failure 12/13/2010   BPH (benign prostatic hyperplasia) 12/13/2010   Unilateral primary osteoarthritis, left knee 12/13/2010   Dyslipidemia 12/13/2010   Status post cholecystectomy 12/13/2010   Trochanteric Bursitis of right hip 07/18/2011   Dyspepsia 08/14/2012   Pseudogout of knee 12/12/2012   Skin rash 04/11/2013   S/P total knee replacement using cement 03/31/2014   Osteoarthritis of right knee 04/01/2014   Urinary retention due to benign prostatic hyperplasia 04/03/2014   Ileus, postoperative ([place]) 04/03/2014   Bronchial asthma 06/29/2014   RBBB (right bundle branch block with left anterior fascicular block) 04/12/2016   PVC's  (premature ventricular contractions) 04/12/2016   Chest wall pain 04/12/2016   S/P total knee replacement using cement, left 08/01/2016   Gastroesophageal reflux disease 05/31/2017   Ventricular bigeminy 08/16/2018   Nonischemic cardiomyopathy ([place]) 11/04/2018   Dyspnea 11/04/2018   Acute pain of left knee 03/13/2019   Malignant neoplasm of prostate ([place]) 01/19/2021   Malignant neoplasm of prostate metastatic to bone ([place]) 06/29/2021   Unilateral primary osteoarthritis, left hip 09/06/2021   Resolved Ambulatory Problems    Diagnosis Date Noted   No Resolved Ambulatory Problems   Past Medical History:  Diagnosis Date   Arthritis    Asthma    Dysrhythmia    GERD (gastroesophageal reflux disease)    Kidney stone on left side    Prostate cancer ([place])    PVC (premature ventricular contraction)    Recurrent upper respiratory infection (URI) 03/2011    SOCIAL HX:  Social History   Tobacco Use   Smoking status: Never   Smokeless tobacco: Never  Substance Use Topics   Alcohol use: No    Alcohol/week: 0.0 standard drinks of alcohol     ALLERGIES:  Allergies  Allergen Reactions   Erythromycin Base Anaphylaxis   Sulfur Anaphylaxis   Tetracycline Anaphylaxis   Tape Other (See Comments)    Adhesive tape blisters   Tetracycline Hcl     Other reaction(s): throat swelling:tongue to break out   Ciprofloxacin Other (See Comments)    "IT BROKE MY TONGUE OUT AND CAUSE SORES IN MY THROAT"   Erythromycin Rash   Sulfa Antibiotics Rash   Tetracyclines & Related Rash      PERTINENT MEDICATIONS:  Outpatient Encounter Medications as of 07/03/2022  Medication Sig   albuterol (VENTOLIN HFA) 108 (90 Base) MCG/ACT inhaler Inhale 2 puffs into the lungs every 6 (six) hours as needed for wheezing or  shortness of breath. Future refills from PCP.   Ascorbic Acid (VITAMIN C) 500 MG tablet Take 1 tablet (500 mg total) by mouth daily.   CALCIUM PO Take 2 capsules by mouth daily.   clotrimazole  (LOTRIMIN) 1 % cream Apply topically.   degarelix (FIRMAGON) 80 MG injection Inject 80 mg into the skin every 28 (twenty-eight) days.   dexamethasone (DECADRON) 4 MG tablet Take 1 tablet (4 mg total) by mouth every morning.   diclofenac sodium (VOLTAREN) 1 % GEL Apply 4 g topically 3 (three) times daily as needed (Apply to large joint up to 3 times daily as needed).   flecainide (TAMBOCOR) 50 MG tablet Take 1 tablet (50 mg total) by mouth 2 (two) times daily.   gabapentin (NEURONTIN) 100 MG capsule Take 100 mg by mouth 3 (three) times daily.   HYDROmorphone (DILAUDID) 2 MG tablet Take 1 tablet (2 mg total) by mouth every 6 (six) hours as needed for severe pain.   ibuprofen (ADVIL) 200 MG tablet Take 200 mg by mouth every 8 (eight) hours as needed (pain).   LORazepam (ATIVAN) 0.5 MG tablet Take 0.5 mg by mouth every 6 (six) hours as needed.   losartan (COZAAR) 25 MG tablet TAKE ONE TABLET BY MOUTH ONCE DAILY   metoprolol succinate (TOPROL-XL) 25 MG 24 hr tablet TAKE 1 TABLET ONCE DAILY.   nystatin cream (MYCOSTATIN) Apply 1 application topically 2 (two) times daily.   potassium chloride SA (KLOR-CON M) 20 MEQ tablet TAKE 1 TABLET ONCE DAILY.   tamsulosin (FLOMAX) 0.4 MG CAPS capsule Take 1 capsule (0.4 mg total) by mouth 2 (two) times daily.   No facility-administered encounter medications on file as of 07/03/2022.    Thank you for the opportunity to participate in the care of Mr. Stayer.  The palliative care team will continue to follow. Please call our office at [phone] if we can be of additional assistance.   Hollace Kinnier, DO  COVID-19 PATIENT SCREENING TOOL Asked and negative response unless otherwise noted:  Have you had symptoms of covid, tested positive or been in contact with someone with symptoms/positive test in the past 5-10 days? no

## 2022-07-04 ENCOUNTER — Encounter: Payer: Self-pay | Admitting: Internal Medicine

## 2022-07-05 DIAGNOSIS — I429 Cardiomyopathy, unspecified: Secondary | ICD-10-CM | POA: Diagnosis not present

## 2022-07-05 DIAGNOSIS — R338 Other retention of urine: Secondary | ICD-10-CM | POA: Diagnosis not present

## 2022-07-05 DIAGNOSIS — R1310 Dysphagia, unspecified: Secondary | ICD-10-CM | POA: Diagnosis not present

## 2022-07-05 DIAGNOSIS — I110 Hypertensive heart disease with heart failure: Secondary | ICD-10-CM | POA: Diagnosis not present

## 2022-07-05 DIAGNOSIS — N401 Enlarged prostate with lower urinary tract symptoms: Secondary | ICD-10-CM | POA: Diagnosis not present

## 2022-07-05 DIAGNOSIS — C7800 Secondary malignant neoplasm of unspecified lung: Secondary | ICD-10-CM | POA: Diagnosis not present

## 2022-07-05 DIAGNOSIS — N35919 Unspecified urethral stricture, male, unspecified site: Secondary | ICD-10-CM | POA: Diagnosis not present

## 2022-07-05 DIAGNOSIS — C7951 Secondary malignant neoplasm of bone: Secondary | ICD-10-CM | POA: Diagnosis not present

## 2022-07-05 DIAGNOSIS — E785 Hyperlipidemia, unspecified: Secondary | ICD-10-CM | POA: Diagnosis not present

## 2022-07-05 DIAGNOSIS — C61 Malignant neoplasm of prostate: Secondary | ICD-10-CM | POA: Diagnosis not present

## 2022-07-05 DIAGNOSIS — G4733 Obstructive sleep apnea (adult) (pediatric): Secondary | ICD-10-CM | POA: Diagnosis not present

## 2022-07-05 DIAGNOSIS — K219 Gastro-esophageal reflux disease without esophagitis: Secondary | ICD-10-CM | POA: Diagnosis not present

## 2022-07-05 DIAGNOSIS — I4510 Unspecified right bundle-branch block: Secondary | ICD-10-CM | POA: Diagnosis not present

## 2022-07-05 DIAGNOSIS — C787 Secondary malignant neoplasm of liver and intrahepatic bile duct: Secondary | ICD-10-CM | POA: Diagnosis not present

## 2022-07-05 DIAGNOSIS — I5030 Unspecified diastolic (congestive) heart failure: Secondary | ICD-10-CM | POA: Diagnosis not present

## 2022-07-06 DIAGNOSIS — C7951 Secondary malignant neoplasm of bone: Secondary | ICD-10-CM | POA: Diagnosis not present

## 2022-07-06 DIAGNOSIS — C61 Malignant neoplasm of prostate: Secondary | ICD-10-CM | POA: Diagnosis not present

## 2022-07-06 DIAGNOSIS — R1310 Dysphagia, unspecified: Secondary | ICD-10-CM | POA: Diagnosis not present

## 2022-07-06 DIAGNOSIS — C787 Secondary malignant neoplasm of liver and intrahepatic bile duct: Secondary | ICD-10-CM | POA: Diagnosis not present

## 2022-07-06 DIAGNOSIS — C7800 Secondary malignant neoplasm of unspecified lung: Secondary | ICD-10-CM | POA: Diagnosis not present

## 2022-07-06 DIAGNOSIS — I110 Hypertensive heart disease with heart failure: Secondary | ICD-10-CM | POA: Diagnosis not present

## 2022-07-07 DIAGNOSIS — C7951 Secondary malignant neoplasm of bone: Secondary | ICD-10-CM | POA: Diagnosis not present

## 2022-07-07 DIAGNOSIS — C7800 Secondary malignant neoplasm of unspecified lung: Secondary | ICD-10-CM | POA: Diagnosis not present

## 2022-07-07 DIAGNOSIS — I110 Hypertensive heart disease with heart failure: Secondary | ICD-10-CM | POA: Diagnosis not present

## 2022-07-07 DIAGNOSIS — C787 Secondary malignant neoplasm of liver and intrahepatic bile duct: Secondary | ICD-10-CM | POA: Diagnosis not present

## 2022-07-07 DIAGNOSIS — R1310 Dysphagia, unspecified: Secondary | ICD-10-CM | POA: Diagnosis not present

## 2022-07-07 DIAGNOSIS — C61 Malignant neoplasm of prostate: Secondary | ICD-10-CM | POA: Diagnosis not present

## 2022-08-04 DEATH — deceased

## 2022-11-20 ENCOUNTER — Ambulatory Visit: Payer: Medicare Other | Admitting: Internal Medicine
